# Patient Record
Sex: Female | Born: 1935 | Race: Black or African American | Hispanic: No | Smoking: Former smoker
Health system: Southern US, Community
[De-identification: ages and names within clinical notes are randomized; demographics above are authoritative.]

## PROBLEM LIST (undated history)

## (undated) DIAGNOSIS — E119 Type 2 diabetes mellitus without complications: Secondary | ICD-10-CM

## (undated) DIAGNOSIS — E785 Hyperlipidemia, unspecified: Secondary | ICD-10-CM

## (undated) DIAGNOSIS — C799 Secondary malignant neoplasm of unspecified site: Secondary | ICD-10-CM

## (undated) DIAGNOSIS — J45909 Unspecified asthma, uncomplicated: Secondary | ICD-10-CM

## (undated) DIAGNOSIS — H409 Unspecified glaucoma: Secondary | ICD-10-CM

## (undated) DIAGNOSIS — I10 Essential (primary) hypertension: Secondary | ICD-10-CM

## (undated) DIAGNOSIS — N189 Chronic kidney disease, unspecified: Secondary | ICD-10-CM

## (undated) DIAGNOSIS — G709 Myoneural disorder, unspecified: Secondary | ICD-10-CM

## (undated) HISTORY — DX: Essential (primary) hypertension: I10

## (undated) HISTORY — DX: Hyperlipidemia, unspecified: E78.5

## (undated) HISTORY — DX: Chronic kidney disease, unspecified: N18.9

## (undated) HISTORY — DX: Unspecified asthma, uncomplicated: J45.909

## (undated) HISTORY — DX: Unspecified glaucoma: H40.9

## (undated) HISTORY — DX: Type 2 diabetes mellitus without complications: E11.9

## (undated) HISTORY — DX: Myoneural disorder, unspecified: G70.9

---

## 1998-01-15 ENCOUNTER — Ambulatory Visit (HOSPITAL_COMMUNITY): Admission: RE | Admit: 1998-01-15 | Discharge: 1998-01-15 | Payer: Self-pay | Admitting: Internal Medicine

## 1998-10-20 ENCOUNTER — Other Ambulatory Visit: Admission: RE | Admit: 1998-10-20 | Discharge: 1998-10-20 | Payer: Self-pay | Admitting: Internal Medicine

## 1998-11-24 ENCOUNTER — Ambulatory Visit (HOSPITAL_COMMUNITY): Admission: RE | Admit: 1998-11-24 | Discharge: 1998-11-24 | Payer: Self-pay | Admitting: *Deleted

## 1999-05-31 ENCOUNTER — Encounter: Payer: Self-pay | Admitting: Internal Medicine

## 1999-05-31 ENCOUNTER — Encounter: Admission: RE | Admit: 1999-05-31 | Discharge: 1999-05-31 | Payer: Self-pay | Admitting: Internal Medicine

## 1999-06-30 ENCOUNTER — Ambulatory Visit (HOSPITAL_COMMUNITY): Admission: RE | Admit: 1999-06-30 | Discharge: 1999-06-30 | Payer: Self-pay | Admitting: Gastroenterology

## 2000-01-07 ENCOUNTER — Encounter: Admission: RE | Admit: 2000-01-07 | Discharge: 2000-01-07 | Payer: Self-pay | Admitting: Internal Medicine

## 2000-01-07 ENCOUNTER — Encounter: Payer: Self-pay | Admitting: Internal Medicine

## 2000-11-23 ENCOUNTER — Encounter: Payer: Self-pay | Admitting: Internal Medicine

## 2000-11-23 ENCOUNTER — Encounter: Admission: RE | Admit: 2000-11-23 | Discharge: 2000-11-23 | Payer: Self-pay | Admitting: Internal Medicine

## 2001-01-08 ENCOUNTER — Encounter: Admission: RE | Admit: 2001-01-08 | Discharge: 2001-01-08 | Payer: Self-pay | Admitting: Internal Medicine

## 2001-01-08 ENCOUNTER — Encounter: Payer: Self-pay | Admitting: Internal Medicine

## 2002-01-09 ENCOUNTER — Encounter: Admission: RE | Admit: 2002-01-09 | Discharge: 2002-01-09 | Payer: Self-pay | Admitting: Internal Medicine

## 2002-01-09 ENCOUNTER — Encounter: Payer: Self-pay | Admitting: Internal Medicine

## 2003-01-10 ENCOUNTER — Encounter: Payer: Self-pay | Admitting: Internal Medicine

## 2003-01-10 ENCOUNTER — Encounter: Admission: RE | Admit: 2003-01-10 | Discharge: 2003-01-10 | Payer: Self-pay | Admitting: Internal Medicine

## 2003-04-11 ENCOUNTER — Other Ambulatory Visit: Admission: RE | Admit: 2003-04-11 | Discharge: 2003-04-11 | Payer: Self-pay | Admitting: Internal Medicine

## 2003-05-07 ENCOUNTER — Ambulatory Visit (HOSPITAL_COMMUNITY): Admission: RE | Admit: 2003-05-07 | Discharge: 2003-05-07 | Payer: Self-pay | Admitting: Gastroenterology

## 2003-05-07 ENCOUNTER — Encounter (INDEPENDENT_AMBULATORY_CARE_PROVIDER_SITE_OTHER): Payer: Self-pay | Admitting: Specialist

## 2004-01-15 ENCOUNTER — Encounter: Admission: RE | Admit: 2004-01-15 | Discharge: 2004-01-15 | Payer: Self-pay | Admitting: Internal Medicine

## 2004-08-20 ENCOUNTER — Encounter: Admission: RE | Admit: 2004-08-20 | Discharge: 2004-08-20 | Payer: Self-pay | Admitting: Internal Medicine

## 2005-01-25 ENCOUNTER — Encounter: Admission: RE | Admit: 2005-01-25 | Discharge: 2005-01-25 | Payer: Self-pay | Admitting: Internal Medicine

## 2005-08-11 ENCOUNTER — Encounter: Admission: RE | Admit: 2005-08-11 | Discharge: 2005-08-11 | Payer: Self-pay | Admitting: Internal Medicine

## 2006-01-26 ENCOUNTER — Encounter: Admission: RE | Admit: 2006-01-26 | Discharge: 2006-01-26 | Payer: Self-pay | Admitting: Internal Medicine

## 2006-02-14 ENCOUNTER — Encounter: Admission: RE | Admit: 2006-02-14 | Discharge: 2006-02-14 | Payer: Self-pay | Admitting: Internal Medicine

## 2007-02-06 ENCOUNTER — Encounter: Admission: RE | Admit: 2007-02-06 | Discharge: 2007-02-06 | Payer: Self-pay | Admitting: Internal Medicine

## 2008-02-08 ENCOUNTER — Encounter: Admission: RE | Admit: 2008-02-08 | Discharge: 2008-02-08 | Payer: Self-pay | Admitting: Internal Medicine

## 2008-12-26 ENCOUNTER — Ambulatory Visit (HOSPITAL_BASED_OUTPATIENT_CLINIC_OR_DEPARTMENT_OTHER): Admission: RE | Admit: 2008-12-26 | Discharge: 2008-12-26 | Payer: Self-pay | Admitting: Orthopedic Surgery

## 2009-02-11 ENCOUNTER — Encounter: Admission: RE | Admit: 2009-02-11 | Discharge: 2009-02-11 | Payer: Self-pay | Admitting: Internal Medicine

## 2009-08-04 ENCOUNTER — Encounter: Admission: RE | Admit: 2009-08-04 | Discharge: 2009-08-04 | Payer: Self-pay | Admitting: Internal Medicine

## 2010-02-16 ENCOUNTER — Encounter: Admission: RE | Admit: 2010-02-16 | Discharge: 2010-02-16 | Payer: Self-pay | Admitting: Internal Medicine

## 2010-08-09 ENCOUNTER — Encounter: Payer: Self-pay | Admitting: Internal Medicine

## 2010-10-25 LAB — BASIC METABOLIC PANEL
Calcium: 8.8 mg/dL (ref 8.4–10.5)
GFR calc Af Amer: 42 mL/min — ABNORMAL LOW (ref >60)
GFR calc non Af Amer: 35 mL/min — ABNORMAL LOW (ref >60)
Glucose, Bld: 116 mg/dL — ABNORMAL HIGH (ref 70–99)
Sodium: 140 mEq/L (ref 135–145)

## 2010-11-30 NOTE — Op Note (Signed)
April Kennedy, April Kennedy              ACCOUNT NO.:  [id number]   MEDICAL RECORD NO.:  [id number]          PATIENT TYPE:  AMB   LOCATION:  DSC                          FACILITY:  [facility]   PHYSICIAN:  Katy Fitch. Sypher, M.D. DATE OF BIRTH:  April 29, 1934   DATE OF PROCEDURE:  12/26/2008  DATE OF DISCHARGE:                               OPERATIVE REPORT   PREOPERATIVE DIAGNOSIS:  Entrapment neuropathy, median nerve, right  carpal tunnel.   POSTOPERATIVE DIAGNOSIS:  Entrapment neuropathy, median nerve, right  carpal tunnel.   OPERATIONS:  Release of right transcarpal ligament.   OPERATIONS:  Katy Fitch. Sypher, MD   ASSISTANT:  Marveen Reeks Dasnoit, PA-C   ANESTHESIA:  General by LMA.   SUPERVISING ANESTHESIOLOGIST:  Quita Skye. Krista Blue, MD   INDICATIONS:  [name] is a 77 year old woman referred through the  courtesy of Dr. Willey Blade for evaluation and management of hand  numbness.  Clinical examination revealed signs of right carpal tunnel  syndrome.  Electrodiagnostic studies were completed and documented  median neuropathy.   Due to failed respond to nonoperative measures, she is brought to the  operating at this time for release of right transcarpal ligament.   PROCEDURE:  Keimya Briddell was brought to the operating room and placed  in the supine position upon the operating table.   Following the induction of general anesthesia by LMA technique, the  right arm was prepped with Betadine soap and solution, sterilely draped.  A pneumatic tourniquet was applied to the proximal right brachium.   On exsanguination of the right arm with Esmarch bandage, arterial  tourniquet was inflated to 250 mmHg.   The procedure commenced with a short incision in line of the ring finger  and the palm.  Subcutaneous tissues were carefully divided revealing  palmar fascia.  This was split longitudinally to reveal the common  sensory branch of the median nerve.  There was a variant of thenar  muscle  anatomy that extended towards the hypothenar eminence.  This was  carefully teased apart with a Penfield 4 elevator, looking for motor  branches.  None were identified.  With great care, the fascia at the  margin of the transverse carpal ligament distally was released and  ultimately we were able to identify the plane of the nerve and the  tendons in the ulnar bursa.   The ulnar aspect of the transverse carpal ligament was then released  subcutaneously into the distal forearm.  This widely opened the carpal  canal.  No mass or other predicaments were noted.   Bleeding points along the margin of the released ligament were  electrocauterized with bipolar current followed by repair of the skin  with intradermal 3-0 Prolene suture.   A compressive dressing was applied with a volar plaster splint  maintaining the wrist in 5 degrees of dorsiflexion.   There were no apparent complications.   For aftercare, Ms. Guerrieri was placed in a compressive dressing with a  volar plaster splint maintaining the wrist in 5 degrees of dorsiflexion.  She is provided prescription for Vicodin 5 mg 1 p.o. q.4-6 h.  p.r.n.  pain.  She is provided 20 tablets without refill.   I will see her back for followup in 1 week or sooner p.r.n. problems.      Katy Fitch Sypher, M.D.  Electronically Signed     RVS/MEDQ  D:  12/26/2008  T:  12/27/2008  Job:  [id number]   cc:   Minerva Areola L. August Saucer, M.D.

## 2010-12-03 ENCOUNTER — Ambulatory Visit
Admission: RE | Admit: 2010-12-03 | Discharge: 2010-12-03 | Disposition: A | Discharge status: Home / Self Care | Payer: Medicare Other | Source: Ambulatory Visit | Attending: Internal Medicine | Admitting: Internal Medicine

## 2010-12-03 ENCOUNTER — Other Ambulatory Visit: Payer: Self-pay | Admitting: Internal Medicine

## 2010-12-03 DIAGNOSIS — J40 Bronchitis, not specified as acute or chronic: Secondary | ICD-10-CM

## 2010-12-03 NOTE — Op Note (Signed)
NAME:  April Kennedy, April Kennedy                        ACCOUNT NO.:  [id number]   MEDICAL RECORD NO.:  [id number]                   PATIENT TYPE:  AMB   LOCATION:  ENDO                                 FACILITY:  [facility]   PHYSICIAN:  Anselmo Rod, M.D.               DATE OF BIRTH:  February 23, 1934   DATE OF PROCEDURE:  05/07/2003  DATE OF DISCHARGE:                                 OPERATIVE REPORT   PROCEDURE PERFORMED:  Colonoscopy with snare polypectomy times two and cold  biopsies times five.   ENDOSCOPIST:  Charna Elizabeth, M.D.   INSTRUMENT USED:  Olympus video colonoscope.   INDICATIONS FOR PROCEDURE:  The patient is a 77 year old African-American  female with a personal history of adenomatous undergoing repeat colonoscopy.  Rule out colonic polyps, masses, etc.   PREPROCEDURE PREPARATION:  Informed consent was procured from the patient.  The patient was fasted for eight hours prior to the procedure and prepped  with a bottle of magnesium citrate and a gallon of GoLYTELY the night prior  to the procedure.   PREPROCEDURE PHYSICAL:  The patient had stable vital signs.  Neck supple.  Chest clear to auscultation.  S1 and S2 regular.  Abdomen soft with normal  bowel sounds.   DESCRIPTION OF PROCEDURE:  The patient was placed in left lateral decubitus  position and sedated with 70 mg of Demerol and 7 mg of Versed intravenously.  Once the patient was adequately sedated and maintained on low flow oxygen  and continuous cardiac monitoring, the Olympus video colonoscope was  advanced from the rectum to the cecum with difficulty.  There was a  significant amount of residual stool in the right colon.  The appendicular  orifice and ileocecal valve were visualized and photographed. There was  evidence of pandiverticulosis with more prominent changes in the sigmoid  colon.  Two flat polyps were snared from the proximal and distal right colon  and placed in the same bottle.  There was an erythematous  fold seen in the  proximal right colon which was biopsied for pathology.  The nature of this  lesion is unclear.  Question ischemia.  As there was significant amount of  residual stool in the right colon, multiple washes had to be done.  Small  lesions could have been missed.  Retroflexion in the rectum revealed no  abnormalities.  No masses or polyps were seen in the left or transverse  colon; however, visualization was not adequate.   IMPRESSION:  1. Two colonic polyps removed (see description above) from proximal and     distal right colon respectively.  2. Scattered diverticulosis.  3. Large amount of residual stool in the right colon.  Small lesion could     have been missed.  4. Erythematous fold biopsied from proximal right colon.   RECOMMENDATIONS:  1. Await pathology results.  2. Avoid all nonsteroidals including aspirin for the next four  weeks.  3. Outpatient follow-up for the next two weeks for further recommendations.                                                   Anselmo Rod, M.D.    JNM/MEDQ  D:  05/07/2003  T:  05/07/2003  Job:  [id number]   cc:   Minerva Areola L. August Saucer, M.D.  [address] [address]  [place]  [place] [address]  Fax: [phone]

## 2010-12-03 NOTE — Procedures (Signed)
[facility]  Patient:    April Kennedy                      MRN: [id number] Proc. Date: 06/30/99 Adm. Date:  [id number] Attending:  Charna Elizabeth CC:         Lind Guest. August Saucer, M.D.                           Procedure Report  DATE OF BIRTH:  July 21, 1933  REFERRING PHYSICIAN:  Lind Guest. August Saucer, M.D.  PROCEDURE PERFORMED:  Flexible sigmoidoscopy.  ENDOSCOPIST:  Anselmo Rod, M.D.  INSTRUMENT USED:  Olympus video colonoscope.  INDICATIONS FOR PROCEDURE:  Followup on an abnormal polyp at the anal verge with an atypical lymphoid infiltrate.  PREPROCEDURE PREPARATION:  Informed consent was procured from the patient.  The  patient was fasted for eight hours prior to the procedure, and prepped with two  Fleets enemas the morning of the procedure.  PREPROCEDURE PHYSICAL:  VITAL SIGNS:  Stable.  NECK:  Supple.  CHEST:  Clear to auscultation, S1 and S2 regular.  No murmur, rub or gallops. o rales, rhonchi or wheezing.  ABDOMEN:  Soft with normal abdominal bowel sounds.  DESCRIPTION OF PROCEDURE:  The patient was placed in the left lateral decubitus  position.  No sedation was used.  Once the patient was adequately positioned, the Olympus video colonoscope was advanced from the rectum to 30 cm without difficulty. Solid stool was present at 30 cm.  The anal verge appeared healthy with no recurrent masses or polyps.  No other abnormalities were seen after 30 cm.  The  procedure was aborted at this time.  RECOMMENDATIONS:  Repeat colonoscopy recommended in the next three years unless the patient were to develop any problems during the interim. DD:  06/30/99 TD:  07/01/99 Job: [id number]

## 2011-01-24 ENCOUNTER — Other Ambulatory Visit: Payer: Self-pay | Admitting: Internal Medicine

## 2011-01-24 DIAGNOSIS — Z1231 Encounter for screening mammogram for malignant neoplasm of breast: Secondary | ICD-10-CM

## 2011-02-22 ENCOUNTER — Ambulatory Visit
Admission: RE | Admit: 2011-02-22 | Discharge: 2011-02-22 | Disposition: A | Discharge status: Home / Self Care | Payer: Medicare Other | Source: Ambulatory Visit | Attending: Internal Medicine | Admitting: Internal Medicine

## 2011-02-22 DIAGNOSIS — Z1231 Encounter for screening mammogram for malignant neoplasm of breast: Secondary | ICD-10-CM

## 2011-11-10 LAB — HEMOGLOBIN A1C: Hgb A1c MFr Bld: 5.9 % (ref 4.0–6.0)

## 2011-11-22 ENCOUNTER — Other Ambulatory Visit: Payer: Self-pay | Admitting: Internal Medicine

## 2011-11-22 ENCOUNTER — Ambulatory Visit
Admission: RE | Admit: 2011-11-22 | Discharge: 2011-11-22 | Disposition: A | Discharge status: Home / Self Care | Payer: Medicare Other | Source: Ambulatory Visit | Attending: Internal Medicine | Admitting: Internal Medicine

## 2011-11-22 DIAGNOSIS — M25559 Pain in unspecified hip: Secondary | ICD-10-CM

## 2012-01-27 ENCOUNTER — Other Ambulatory Visit: Payer: Self-pay | Admitting: Internal Medicine

## 2012-01-27 DIAGNOSIS — Z1231 Encounter for screening mammogram for malignant neoplasm of breast: Secondary | ICD-10-CM

## 2012-02-23 ENCOUNTER — Ambulatory Visit
Admission: RE | Admit: 2012-02-23 | Discharge: 2012-02-23 | Disposition: A | Discharge status: Home / Self Care | Payer: Medicare Other | Source: Ambulatory Visit | Attending: Internal Medicine | Admitting: Internal Medicine

## 2012-02-23 DIAGNOSIS — Z1231 Encounter for screening mammogram for malignant neoplasm of breast: Secondary | ICD-10-CM

## 2012-06-28 LAB — BASIC METABOLIC PANEL
BUN: 24 mg/dL — AB (ref 4–21)
Potassium: 4.5 mmol/L (ref 3.4–5.3)
Sodium: 141 mmol/L (ref 137–147)

## 2012-06-28 LAB — CBC AND DIFFERENTIAL: HCT: 44 % (ref 36–46)

## 2012-06-28 LAB — HEPATIC FUNCTION PANEL
Alkaline Phosphatase: 49 U/L (ref 25–125)
Bilirubin, Total: 0.6 mg/dL

## 2012-08-24 ENCOUNTER — Encounter (HOSPITAL_COMMUNITY): Payer: Self-pay | Admitting: General Practice

## 2012-08-24 DIAGNOSIS — H409 Unspecified glaucoma: Secondary | ICD-10-CM | POA: Insufficient documentation

## 2012-08-24 DIAGNOSIS — Z720 Tobacco use: Secondary | ICD-10-CM | POA: Insufficient documentation

## 2012-08-24 DIAGNOSIS — E785 Hyperlipidemia, unspecified: Secondary | ICD-10-CM

## 2013-01-30 ENCOUNTER — Other Ambulatory Visit: Payer: Self-pay

## 2013-01-30 DIAGNOSIS — Z1231 Encounter for screening mammogram for malignant neoplasm of breast: Secondary | ICD-10-CM

## 2013-02-27 ENCOUNTER — Ambulatory Visit
Admission: RE | Admit: 2013-02-27 | Discharge: 2013-02-27 | Disposition: A | Discharge status: Home / Self Care | Payer: Medicare Other | Source: Ambulatory Visit

## 2013-02-27 DIAGNOSIS — Z1231 Encounter for screening mammogram for malignant neoplasm of breast: Secondary | ICD-10-CM

## 2014-04-21 ENCOUNTER — Other Ambulatory Visit: Payer: Self-pay

## 2014-04-21 DIAGNOSIS — Z1231 Encounter for screening mammogram for malignant neoplasm of breast: Secondary | ICD-10-CM

## 2014-04-22 ENCOUNTER — Ambulatory Visit
Admission: RE | Admit: 2014-04-22 | Discharge: 2014-04-22 | Disposition: A | Discharge status: Home / Self Care | Payer: Medicare Other | Source: Ambulatory Visit

## 2014-04-22 DIAGNOSIS — Z1231 Encounter for screening mammogram for malignant neoplasm of breast: Secondary | ICD-10-CM

## 2015-01-16 ENCOUNTER — Ambulatory Visit
Admission: RE | Admit: 2015-01-16 | Discharge: 2015-01-16 | Disposition: A | Discharge status: Home / Self Care | Payer: Medicare Other | Source: Ambulatory Visit | Attending: Internal Medicine | Admitting: Internal Medicine

## 2015-01-16 ENCOUNTER — Encounter (HOSPITAL_COMMUNITY): Payer: Self-pay | Admitting: *Deleted

## 2015-01-16 ENCOUNTER — Emergency Department (HOSPITAL_COMMUNITY): Payer: Medicare Other

## 2015-01-16 ENCOUNTER — Other Ambulatory Visit: Payer: Self-pay | Admitting: Internal Medicine

## 2015-01-16 ENCOUNTER — Inpatient Hospital Stay (HOSPITAL_COMMUNITY)
Admission: EM | Admit: 2015-01-16 | Discharge: 2015-01-27 | DRG: 843 | Disposition: A | Discharge status: Home Health | Payer: Medicare Other | Attending: Family Medicine | Admitting: Family Medicine

## 2015-01-16 ENCOUNTER — Inpatient Hospital Stay (HOSPITAL_COMMUNITY): Payer: Medicare Other

## 2015-01-16 DIAGNOSIS — J90 Pleural effusion, not elsewhere classified: Secondary | ICD-10-CM | POA: Diagnosis not present

## 2015-01-16 DIAGNOSIS — E785 Hyperlipidemia, unspecified: Secondary | ICD-10-CM

## 2015-01-16 DIAGNOSIS — R0602 Shortness of breath: Secondary | ICD-10-CM | POA: Diagnosis present

## 2015-01-16 DIAGNOSIS — J45901 Unspecified asthma with (acute) exacerbation: Secondary | ICD-10-CM | POA: Diagnosis present

## 2015-01-16 DIAGNOSIS — R0600 Dyspnea, unspecified: Secondary | ICD-10-CM | POA: Diagnosis not present

## 2015-01-16 DIAGNOSIS — E1165 Type 2 diabetes mellitus with hyperglycemia: Secondary | ICD-10-CM | POA: Diagnosis present

## 2015-01-16 DIAGNOSIS — R599 Enlarged lymph nodes, unspecified: Secondary | ICD-10-CM | POA: Insufficient documentation

## 2015-01-16 DIAGNOSIS — Z79899 Other long term (current) drug therapy: Secondary | ICD-10-CM | POA: Diagnosis not present

## 2015-01-16 DIAGNOSIS — N179 Acute kidney failure, unspecified: Secondary | ICD-10-CM

## 2015-01-16 DIAGNOSIS — R748 Abnormal levels of other serum enzymes: Secondary | ICD-10-CM | POA: Diagnosis present

## 2015-01-16 DIAGNOSIS — C801 Malignant (primary) neoplasm, unspecified: Secondary | ICD-10-CM | POA: Diagnosis present

## 2015-01-16 DIAGNOSIS — Z9689 Presence of other specified functional implants: Secondary | ICD-10-CM

## 2015-01-16 DIAGNOSIS — N183 Chronic kidney disease, stage 3 unspecified: Secondary | ICD-10-CM

## 2015-01-16 DIAGNOSIS — J4541 Moderate persistent asthma with (acute) exacerbation: Secondary | ICD-10-CM | POA: Diagnosis not present

## 2015-01-16 DIAGNOSIS — J869 Pyothorax without fistula: Secondary | ICD-10-CM

## 2015-01-16 DIAGNOSIS — H409 Unspecified glaucoma: Secondary | ICD-10-CM | POA: Diagnosis present

## 2015-01-16 DIAGNOSIS — R7989 Other specified abnormal findings of blood chemistry: Secondary | ICD-10-CM

## 2015-01-16 DIAGNOSIS — J9601 Acute respiratory failure with hypoxia: Secondary | ICD-10-CM | POA: Diagnosis present

## 2015-01-16 DIAGNOSIS — L600 Ingrowing nail: Secondary | ICD-10-CM | POA: Diagnosis present

## 2015-01-16 DIAGNOSIS — R739 Hyperglycemia, unspecified: Secondary | ICD-10-CM | POA: Diagnosis present

## 2015-01-16 DIAGNOSIS — Z87891 Personal history of nicotine dependence: Secondary | ICD-10-CM

## 2015-01-16 DIAGNOSIS — Z881 Allergy status to other antibiotic agents status: Secondary | ICD-10-CM

## 2015-01-16 DIAGNOSIS — J189 Pneumonia, unspecified organism: Secondary | ICD-10-CM

## 2015-01-16 DIAGNOSIS — C799 Secondary malignant neoplasm of unspecified site: Secondary | ICD-10-CM

## 2015-01-16 DIAGNOSIS — R778 Other specified abnormalities of plasma proteins: Secondary | ICD-10-CM | POA: Diagnosis present

## 2015-01-16 DIAGNOSIS — Z720 Tobacco use: Secondary | ICD-10-CM

## 2015-01-16 DIAGNOSIS — I10 Essential (primary) hypertension: Secondary | ICD-10-CM

## 2015-01-16 DIAGNOSIS — I129 Hypertensive chronic kidney disease with stage 1 through stage 4 chronic kidney disease, or unspecified chronic kidney disease: Secondary | ICD-10-CM | POA: Diagnosis present

## 2015-01-16 DIAGNOSIS — J910 Malignant pleural effusion: Secondary | ICD-10-CM | POA: Diagnosis present

## 2015-01-16 DIAGNOSIS — J948 Other specified pleural conditions: Secondary | ICD-10-CM | POA: Diagnosis not present

## 2015-01-16 DIAGNOSIS — Z8249 Family history of ischemic heart disease and other diseases of the circulatory system: Secondary | ICD-10-CM | POA: Diagnosis not present

## 2015-01-16 DIAGNOSIS — N19 Unspecified kidney failure: Secondary | ICD-10-CM | POA: Diagnosis not present

## 2015-01-16 DIAGNOSIS — N189 Chronic kidney disease, unspecified: Secondary | ICD-10-CM | POA: Diagnosis present

## 2015-01-16 DIAGNOSIS — Z1231 Encounter for screening mammogram for malignant neoplasm of breast: Secondary | ICD-10-CM

## 2015-01-16 HISTORY — DX: Secondary malignant neoplasm of unspecified site: C79.9

## 2015-01-16 LAB — BASIC METABOLIC PANEL
ANION GAP: 11 (ref 5–15)
BUN: 30 mg/dL — ABNORMAL HIGH (ref 6–20)
CO2: 26 mmol/L (ref 22–32)
Calcium: 8.8 mg/dL — ABNORMAL LOW (ref 8.9–10.3)
Chloride: 101 mmol/L (ref 101–111)
Creatinine, Ser: 2.35 mg/dL — ABNORMAL HIGH (ref 0.44–1.00)
GFR calc non Af Amer: 19 mL/min — ABNORMAL LOW (ref >60)
GFR, EST AFRICAN AMERICAN: 22 mL/min — AB (ref >60)
Glucose, Bld: 189 mg/dL — ABNORMAL HIGH (ref 65–99)
POTASSIUM: 3.9 mmol/L (ref 3.5–5.1)
Sodium: 138 mmol/L (ref 135–145)

## 2015-01-16 LAB — CBC WITH DIFFERENTIAL/PLATELET
Basophils Absolute: 0.1 10*3/uL (ref 0.0–0.1)
Basophils Relative: 1 % (ref 0–1)
Eosinophils Absolute: 0.3 10*3/uL (ref 0.0–0.7)
Eosinophils Relative: 4 % (ref 0–5)
HEMATOCRIT: 37 % (ref 36.0–46.0)
HEMOGLOBIN: 12.5 g/dL (ref 12.0–15.0)
LYMPHS ABS: 1.4 10*3/uL (ref 0.7–4.0)
Lymphocytes Relative: 17 % (ref 12–46)
MCH: 26.5 pg (ref 26.0–34.0)
MCHC: 33.8 g/dL (ref 30.0–36.0)
MCV: 78.4 fL (ref 78.0–100.0)
Monocytes Absolute: 0.9 10*3/uL (ref 0.1–1.0)
Monocytes Relative: 12 % (ref 3–12)
NEUTROS ABS: 5.2 10*3/uL (ref 1.7–7.7)
NEUTROS PCT: 66 % (ref 43–77)
Platelets: 232 10*3/uL (ref 150–400)
RBC: 4.72 MIL/uL (ref 3.87–5.11)
RDW: 13.1 % (ref 11.5–15.5)
WBC: 7.9 10*3/uL (ref 4.0–10.5)

## 2015-01-16 LAB — TROPONIN I
Troponin I: 0.15 ng/mL — ABNORMAL HIGH (ref <0.031)
Troponin I: 0.18 ng/mL — ABNORMAL HIGH (ref <0.031)

## 2015-01-16 LAB — BRAIN NATRIURETIC PEPTIDE: B Natriuretic Peptide: 32.3 pg/mL (ref 0.0–100.0)

## 2015-01-16 MED ORDER — SODIUM CHLORIDE 0.9 % IV SOLN: 250.0000 mL | INTRAVENOUS | Status: DC | PRN

## 2015-01-16 MED ORDER — ACETAMINOPHEN 325 MG PO TABS
650.0000 mg | ORAL_TABLET | Freq: Four times a day (QID) | ORAL | Status: DC | PRN
  Administered 2015-01-20: 650 mg via ORAL
  Filled 2015-01-16: qty 2

## 2015-01-16 MED ORDER — SODIUM CHLORIDE 0.9 % IJ SOLN
3.0000 mL | Freq: Two times a day (BID) | INTRAMUSCULAR | Status: DC
  Administered 2015-01-16 – 2015-01-18 (×4): 3 mL via INTRAVENOUS

## 2015-01-16 MED ORDER — LATANOPROST 0.005 % OP SOLN
1.0000 [drp] | Freq: Every day | OPHTHALMIC | Status: DC
  Administered 2015-01-16 – 2015-01-26 (×11): 1 [drp] via OPHTHALMIC
  Filled 2015-01-16 (×3): qty 2.5

## 2015-01-16 MED ORDER — ACETAMINOPHEN 650 MG RE SUPP: 650.0000 mg | Freq: Four times a day (QID) | RECTAL | Status: DC | PRN

## 2015-01-16 MED ORDER — LORAZEPAM 0.5 MG PO TABS
0.5000 mg | ORAL_TABLET | Freq: Once | ORAL | Status: AC
  Administered 2015-01-17: 0.5 mg via ORAL
  Filled 2015-01-16: qty 1

## 2015-01-16 MED ORDER — AMLODIPINE BESYLATE 10 MG PO TABS
10.0000 mg | ORAL_TABLET | Freq: Every day | ORAL | Status: DC
  Administered 2015-01-16 – 2015-01-27 (×12): 10 mg via ORAL
  Filled 2015-01-16 (×12): qty 1

## 2015-01-16 MED ORDER — SODIUM CHLORIDE 0.9 % IJ SOLN: 3.0000 mL | INTRAMUSCULAR | Status: DC | PRN

## 2015-01-16 MED ORDER — BRIMONIDINE TARTRATE 0.2 % OP SOLN
1.0000 [drp] | Freq: Three times a day (TID) | OPHTHALMIC | Status: DC
  Administered 2015-01-16 – 2015-01-27 (×31): 1 [drp] via OPHTHALMIC
  Filled 2015-01-16 (×3): qty 5

## 2015-01-16 MED ORDER — HEPARIN SODIUM (PORCINE) 5000 UNIT/ML IJ SOLN
5000.0000 [IU] | Freq: Three times a day (TID) | INTRAMUSCULAR | Status: DC
  Administered 2015-01-16 – 2015-01-27 (×29): 5000 [IU] via SUBCUTANEOUS
  Filled 2015-01-16 (×32): qty 1

## 2015-01-16 MED ORDER — ADULT MULTIVITAMIN W/MINERALS CH
1.0000 | ORAL_TABLET | Freq: Every day | ORAL | Status: DC
  Administered 2015-01-16 – 2015-01-27 (×12): 1 via ORAL
  Filled 2015-01-16 (×12): qty 1

## 2015-01-16 MED ORDER — HYDRALAZINE HCL 20 MG/ML IJ SOLN: 10.0000 mg | Freq: Four times a day (QID) | INTRAMUSCULAR | Status: DC | PRN

## 2015-01-16 NOTE — ED Notes (Signed)
She  Saw her doctor Monday and again today for the same complaints

## 2015-01-16 NOTE — H&P (Signed)
[facility] History and Physical  RIYA HUXFORD ZOX:[id number] DOB: 07/01/1934 DOA: 01/16/2015  Referring physician: er PCP: Marlou Sa ERIC, MD   Chief Complaint: SOB  HPI: April Kennedy is a 81 y.o. female  Who has PMHX of HTN and HLD.  She was told to come to the ER when she had a chest x ray ordered by her PCP that showed pleural effusion.  She for 1 week has had SOB with activity.  No LE edema, no fever.  She does endorse weight loss that she contributes to a decreased appetite.  SHe has night sweat and chills.  She was recently started on an abx (not sure of name and has taken 3/7 doses).  She also states that 1 month ago she has a terrible cold and a cough.  In the ER, she had another x ray that confirmed right sided pleural effusion.  Her Cr was also found to be elevated at 2.35.     Review of Systems:  All systems reviewed, negative unless stated above    Past Medical History  Diagnosis Date  . Hypertension   . Glaucoma   . Chronic kidney disease   . Neuromuscular disorder   . Diabetes mellitus without complication   . Asthma   . Hyperlipidemia    History reviewed. No pertinent past surgical history. Social History:  reports that she has quit smoking. She does not have any smokeless tobacco history on file. She reports that she does not drink alcohol. Her drug history is not on file.  Allergies  Allergen Reactions  . Sulfa Antibiotics     Hives/ itching      Family Hx HTN  Prior to Admission medications   Medication Sig Start Date End Date Taking? Authorizing Provider  amLODipine-benazepril (LOTREL) 10-20 MG per capsule Take 1 capsule by mouth daily.   Yes Historical Provider, MD  bimatoprost (LUMIGAN) 0.01 % SOLN Place 1 drop into both eyes at bedtime.    Yes Historical Provider, MD  brimonidine (ALPHAGAN P) 0.1 % SOLN Place 1 drop into both eyes 3 (three) times daily.    Yes Historical Provider, MD  Multiple Vitamin (MULTIVITAMIN) tablet Take 1 tablet  by mouth daily.   Yes Historical Provider, MD   Physical Exam: Filed Vitals:   01/16/15 1522 01/16/15 1600  BP: 127/60 106/91  Pulse: 108 105  Temp: 98.5 F (36.9 C)   Resp: 20 19  Height: '5\' 4"'$  (1.626 m)   Weight: 83.575 kg (184 lb 4 oz)   SpO2: 95% 94%    Wt Readings from Last 3 Encounters:  01/16/15 83.575 kg (184 lb 4 oz)    General:  Appears calm and comfortable Eyes:  normal lids, irises & conjunctiva ENT: grossly normal hearing, lips & tongue Neck: no LAD, masses or thyromegaly Cardiovascular: RRR, no m/r/g. No LE edema. Respiratory: right side with no air movement. normal respiratory effort. No wheezing Abdomen: soft, ntnd Skin: no rash or induration seen on limited exam Musculoskeletal: grossly normal tone BUE/BLE Psychiatric: grossly normal mood and affect, speech fluent and appropriate Neurologic: grossly non-focal.          Labs on Admission:  Basic Metabolic Panel:  Recent Labs Lab 01/16/15 1532  NA 138  K 3.9  CL 101  CO2 26  GLUCOSE 189*  BUN 30*  CREATININE 2.35*  CALCIUM 8.8*   Liver Function Tests: No results for input(s): AST, ALT, ALKPHOS, BILITOT, PROT, ALBUMIN in the last 168 hours. No results  for input(s): LIPASE, AMYLASE in the last 168 hours. No results for input(s): AMMONIA in the last 168 hours. CBC:  Recent Labs Lab 01/16/15 1532  WBC 7.9  NEUTROABS 5.2  HGB 12.5  HCT 37.0  MCV 78.4  PLT 232   Cardiac Enzymes:  Recent Labs Lab 01/16/15 1532  TROPONINI 0.15*    BNP (last 3 results)  Recent Labs  01/16/15 1532  BNP 32.3    ProBNP (last 3 results) No results for input(s): PROBNP in the last 8760 hours.  CBG: No results for input(s): GLUCAP in the last 168 hours.  Radiological Exams on Admission: Dg Chest 2 View  01/16/2015   CLINICAL DATA:  Fluid on the RIGHT lung. Short of breath. Cough for 1 week.  EXAM: CHEST  2 VIEW  COMPARISON:  01/16/2015 chest radiograph at 1154 hours.  FINDINGS: There is a moderate  RIGHT pleural effusion. Collapse/consolidation at the RIGHT lung base involving the RIGHT middle and RIGHT lower lobe. LEFT lung appears within normal limits aside from basilar atelectasis. Cardiopericardial silhouette partially obscured. Aortic arch atherosclerosis.  There is fullness of the RIGHT hilum, which may represent relaxation atelectasis and retraction of the lung however a RIGHT hilar mass cannot be excluded.  IMPRESSION: Moderate unilateral RIGHT pleural effusion with collapse/consolidation of the RIGHT middle and RIGHT lower lobes. RIGHT hilar fullness. The differential considerations are primarily pneumonia with parapneumonic effusion or malignant effusion. Consider thoracentesis with cytology contrast-enhanced chest CT may be useful for further evaluation.   Electronically Signed   By: Dereck Ligas M.D.   On: 01/16/2015 15:57   Dg Chest 2 View  01/16/2015   CLINICAL DATA:  Shortness of breath with exertion, chest tightness and fatigue for 10 days; cold symptoms 4 weeks ago without full recovery; history of asthma and diabetes, discontinued smoking 2 years ago.  EXAM: CHEST  2 VIEW  COMPARISON:  PA and lateral chest of Dec 03, 2010  FINDINGS: There is volume loss on the right occupying approximately 1-1/3 to 1/2 of the lung volume. There is no mediastinal shift. The right upper lobe is clear as is the left lung. The heart and pulmonary vascularity are normal. The bony thorax is unremarkable.  IMPRESSION: Findings compatible with large right pleural effusion. Underlying parenchymal consolidation is suspected as well. Further evaluation with ultrasound would be useful to judge if patient is a candidate for thoracentesis. Chest CT scanning will likely be needed to evaluate the right pleural space more completely.   Electronically Signed   By: David  Martinique M.D.   On: 01/16/2015 11:50    EKG: Independently reviewed. Sinus tachy  Assessment/Plan Active Problems:   Glaucoma   Tobacco abuse    Hyperlipidemia   Pleural effusion, right   Hyperglycemia   Elevated troponin  SOB with exertion due to pleural effusion  Pleural effusion- will do lateral decub to be sure not loculated, also placed order for thoracentesis- unable to do CT with contrast due to elevated renal function - fluid studies ordered for when thoracentesis is done  AKI on CKD?- baseline 3 years ago was 1.9, now 2.3  Elevated troponin- cycle CE, echo, tele  HLD- statin  HTN- PRN IV, hold ACE, resume norvasc  Tobacco abuse former  Glaucoma- eye drops  Hyperglycemia- no h/o DM, on no meds, monitor and start SSI if > 140   Code Status: full DVT Prophylaxis: Family Communication: patient and daughter at bedside Disposition Plan:   Time spent: 75 min  Jakirah Zaun  [facility] Pager [phone]

## 2015-01-16 NOTE — ED Provider Notes (Signed)
CSN: [id number]     Arrival date & time 01/16/15  1509 History   First MD Initiated Contact with Patient 01/16/15 1606     Chief Complaint  Patient presents with  . Shortness of Breath     (Consider location/radiation/quality/duration/timing/severity/associated sxs/prior Treatment) Patient is a 81 y.o. female presenting with shortness of breath.  Shortness of Breath Severity:  Moderate Onset quality:  Gradual Duration:  1 week Timing:  Constant Progression:  Worsening Chronicity:  New Context: activity and URI (1 month ago with cough)   Relieved by:  Rest and sitting up Worsened by:  Exertion (lying flat.) Associated symptoms: cough (productive of occasional clear sputum)   Associated symptoms: no chest pain, no fever and no vomiting     Past Medical History  Diagnosis Date  . Hypertension   . Glaucoma   . Chronic kidney disease   . Neuromuscular disorder   . Diabetes mellitus without complication   . Asthma   . Hyperlipidemia    History reviewed. No pertinent past surgical history. No family history on file. History  Substance Use Topics  . Smoking status: Former Research scientist (life sciences)  . Smokeless tobacco: Not on file  . Alcohol Use: No   OB History    No data available     Review of Systems  Constitutional: Negative for fever.  Respiratory: Positive for cough (productive of occasional clear sputum) and shortness of breath.   Cardiovascular: Negative for chest pain.  Gastrointestinal: Negative for vomiting.  All other systems reviewed and are negative.     Allergies  Sulfa antibiotics  Home Medications   Prior to Admission medications   Medication Sig Start Date End Date Taking? Authorizing Provider  amLODipine-benazepril (LOTREL) 10-20 MG per capsule Take 1 capsule by mouth daily.    Historical Provider, MD  bimatoprost (LUMIGAN) 0.01 % SOLN 1 drop at bedtime.    Historical Provider, MD  brimonidine (ALPHAGAN P) 0.1 % SOLN Place 1 drop into both eyes.    Historical  Provider, MD  Multiple Vitamin (MULTIVITAMIN) tablet Take 1 tablet by mouth daily.    Historical Provider, MD   BP 106/91 mmHg  Pulse 105  Temp(Src) 98.5 F (36.9 C)  Resp 19  Ht '5\' 4"'$  (1.626 m)  Wt 184 lb 4 oz (83.575 kg)  BMI 31.61 kg/m2  SpO2 94% Physical Exam  Constitutional: She is oriented to person, place, and time. She appears well-developed and well-nourished. No distress.  HENT:  Head: Normocephalic and atraumatic.  Mouth/Throat: Oropharynx is clear and moist.  Eyes: Conjunctivae are normal. Pupils are equal, round, and reactive to light. No scleral icterus.  Neck: Neck supple.  Cardiovascular: Normal rate, regular rhythm, normal heart sounds and intact distal pulses.   No murmur heard. Pulmonary/Chest: Effort normal. No stridor. No respiratory distress. She has decreased breath sounds in the right middle field and the right lower field. She has no rales.  Abdominal: Soft. Bowel sounds are normal. She exhibits no distension. There is no tenderness.  Musculoskeletal: Normal range of motion. She exhibits no edema.  Neurological: She is alert and oriented to person, place, and time.  Skin: Skin is warm and dry. No rash noted.  Psychiatric: She has a normal mood and affect. Her behavior is normal.  Nursing note and vitals reviewed.   ED Course  Procedures (including critical care time) Labs Review Labs Reviewed  BASIC METABOLIC PANEL - Abnormal; Notable for the following:    Glucose, Bld 189 (*)    BUN  30 (*)    Creatinine, Ser 2.35 (*)    Calcium 8.8 (*)    GFR calc non Af Amer 19 (*)    GFR calc Af Amer 22 (*)    All other components within normal limits  TROPONIN I - Abnormal; Notable for the following:    Troponin I 0.15 (*)    All other components within normal limits  BRAIN NATRIURETIC PEPTIDE  CBC WITH DIFFERENTIAL/PLATELET    Imaging Review Dg Chest 2 View  01/16/2015   CLINICAL DATA:  Fluid on the RIGHT lung. Short of breath. Cough for 1 week.  EXAM:  CHEST  2 VIEW  COMPARISON:  01/16/2015 chest radiograph at 1154 hours.  FINDINGS: There is a moderate RIGHT pleural effusion. Collapse/consolidation at the RIGHT lung base involving the RIGHT middle and RIGHT lower lobe. LEFT lung appears within normal limits aside from basilar atelectasis. Cardiopericardial silhouette partially obscured. Aortic arch atherosclerosis.  There is fullness of the RIGHT hilum, which may represent relaxation atelectasis and retraction of the lung however a RIGHT hilar mass cannot be excluded.  IMPRESSION: Moderate unilateral RIGHT pleural effusion with collapse/consolidation of the RIGHT middle and RIGHT lower lobes. RIGHT hilar fullness. The differential considerations are primarily pneumonia with parapneumonic effusion or malignant effusion. Consider thoracentesis with cytology contrast-enhanced chest CT may be useful for further evaluation.   Electronically Signed   By: Dereck Ligas M.D.   On: 01/16/2015 15:57   Dg Chest 2 View  01/16/2015   CLINICAL DATA:  Shortness of breath with exertion, chest tightness and fatigue for 10 days; cold symptoms 4 weeks ago without full recovery; history of asthma and diabetes, discontinued smoking 2 years ago.  EXAM: CHEST  2 VIEW  COMPARISON:  PA and lateral chest of Dec 03, 2010  FINDINGS: There is volume loss on the right occupying approximately 1-1/3 to 1/2 of the lung volume. There is no mediastinal shift. The right upper lobe is clear as is the left lung. The heart and pulmonary vascularity are normal. The bony thorax is unremarkable.  IMPRESSION: Findings compatible with large right pleural effusion. Underlying parenchymal consolidation is suspected as well. Further evaluation with ultrasound would be useful to judge if patient is a candidate for thoracentesis. Chest CT scanning will likely be needed to evaluate the right pleural space more completely.   Electronically Signed   By: David  Martinique M.D.   On: 01/16/2015 11:50  All radiology  studies independently viewed by me.      EKG Interpretation   Date/Time:  Friday January 16 2015 15:18:26 EDT Ventricular Rate:  114 PR Interval:  134 QRS Duration: 72 QT Interval:  336 QTC Calculation: 463 R Axis:   61 Text Interpretation:  Sinus tachycardia Otherwise normal ECG rate  increased compared to prior Confirmed by [facility]  MD, TREY ([id number]) on  01/16/2015 4:34:51 PM      MDM   Final diagnoses:  Pleural effusion, right  Shortness of breath    81 yo female with hx of HTN, DM presenting with SOB increasing for past week.  She had outpatient CXR which showed large right pleural effusion.  CXR and labs ordered per ED protocol also reveal slightly elevated troponin (no chest pain and story does not sound consistent with ACS).    Spoke with Dr. Eliseo Squires ([place]) who will admit.    Serita Grit, MD 01/16/15 [phone]

## 2015-01-16 NOTE — ED Notes (Signed)
The pt just left dr e deans office c/o fluid on her lungs .  No chest pain unless  She coughs and she has a cold also

## 2015-01-17 ENCOUNTER — Inpatient Hospital Stay (HOSPITAL_COMMUNITY): Payer: Medicare Other

## 2015-01-17 DIAGNOSIS — N183 Chronic kidney disease, stage 3 (moderate): Secondary | ICD-10-CM

## 2015-01-17 DIAGNOSIS — R0600 Dyspnea, unspecified: Secondary | ICD-10-CM

## 2015-01-17 DIAGNOSIS — J90 Pleural effusion, not elsewhere classified: Secondary | ICD-10-CM

## 2015-01-17 DIAGNOSIS — J948 Other specified pleural conditions: Secondary | ICD-10-CM

## 2015-01-17 LAB — BODY FLUID CELL COUNT WITH DIFFERENTIAL
LYMPHS FL: 34 %
Monocyte-Macrophage-Serous Fluid: 5 % — ABNORMAL LOW (ref 50–90)
Neutrophil Count, Fluid: 61 % — ABNORMAL HIGH (ref 0–25)
WBC FLUID: 1666 uL — AB (ref 0–1000)

## 2015-01-17 LAB — BASIC METABOLIC PANEL
Anion gap: 9 (ref 5–15)
BUN: 26 mg/dL — ABNORMAL HIGH (ref 6–20)
CALCIUM: 8.8 mg/dL — AB (ref 8.9–10.3)
CO2: 28 mmol/L (ref 22–32)
CREATININE: 2.08 mg/dL — AB (ref 0.44–1.00)
Chloride: 100 mmol/L — ABNORMAL LOW (ref 101–111)
GFR calc Af Amer: 25 mL/min — ABNORMAL LOW (ref >60)
GFR, EST NON AFRICAN AMERICAN: 22 mL/min — AB (ref >60)
Glucose, Bld: 153 mg/dL — ABNORMAL HIGH (ref 65–99)
Potassium: 3.5 mmol/L (ref 3.5–5.1)
Sodium: 137 mmol/L (ref 135–145)

## 2015-01-17 LAB — LACTATE DEHYDROGENASE, PLEURAL OR PERITONEAL FLUID: LD, Fluid: 1546 U/L — ABNORMAL HIGH (ref 3–23)

## 2015-01-17 LAB — GLUCOSE, SEROUS FLUID: Glucose, Fluid: 112 mg/dL

## 2015-01-17 LAB — PROTEIN, TOTAL: TOTAL PROTEIN: 7.3 g/dL (ref 6.5–8.1)

## 2015-01-17 LAB — CBC
HCT: 39.5 % (ref 36.0–46.0)
HEMOGLOBIN: 13.3 g/dL (ref 12.0–15.0)
MCH: 26.8 pg (ref 26.0–34.0)
MCHC: 33.7 g/dL (ref 30.0–36.0)
MCV: 79.6 fL (ref 78.0–100.0)
Platelets: 239 10*3/uL (ref 150–400)
RBC: 4.96 MIL/uL (ref 3.87–5.11)
RDW: 13.2 % (ref 11.5–15.5)
WBC: 9.6 10*3/uL (ref 4.0–10.5)

## 2015-01-17 LAB — PROTEIN, BODY FLUID: TOTAL PROTEIN, FLUID: 5 g/dL

## 2015-01-17 LAB — TROPONIN I
TROPONIN I: 0.15 ng/mL — AB (ref <0.031)
TROPONIN I: 0.16 ng/mL — AB (ref <0.031)

## 2015-01-17 MED ORDER — LIDOCAINE HCL (PF) 1 % IJ SOLN
INTRAMUSCULAR | Status: AC
  Filled 2015-01-17: qty 10

## 2015-01-17 NOTE — Progress Notes (Signed)
  Echocardiogram 2D Echocardiogram has been performed.  April Kennedy 01/17/2015, 2:04 PM

## 2015-01-17 NOTE — Procedures (Signed)
[place] R thoracentesis No complication No blood loss. See complete dictation in [facility].

## 2015-01-17 NOTE — Progress Notes (Signed)
April Kennedy TMH:[id number] DOB: [birth year] DOA: 01/16/2015 PCP: Kevan Ny, MD  Brief narrative:  81 y/o ? independent at home, Admitted from PCP office as  OP 2 vw CXR suggestive of Pleural effusion.  PAteitn was noted to have been sick ~ 1 wk prior to admission with cough/cold, and started having a cough/cold ~ may when her granddaughter graduated apparently was Rx with Abx 3/7  Past medical history-As per Problem list Chart reviewed as below- reviewed  Consultants:   Pulmonary  Procedures:  [place] thoracocentesis  Antibiotics:  None yet   Subjective   Well Feels marginally better No f chills rigor cp  SOB still + Daughter chimes in [place] is main issue   Objective    Interim History:   Telemetry: PVC's   Objective: Filed Vitals:   01/16/15 1745 01/16/15 1828 01/16/15 2222 01/17/15 0513  BP: 145/60 147/56 155/87 143/60  Pulse: 101 108 110 104  Temp:  98.3 F (36.8 C) 97.8 F (36.6 C) 97.8 F (36.6 C)  TempSrc:  Oral Oral Oral  Resp: '26 20 20 18  '$ Height:  '5\' 4"'$  (1.626 m)    Weight:  82.555 kg (182 lb)    SpO2: 90% 95% 96% 100%    Intake/Output Summary (Last 24 hours) at 01/17/15 1012 Last data filed at 01/17/15 0954  Gross per 24 hour  Intake    240 ml  Output    200 ml  Net     40 ml    Exam:  General: eomi, ncat, no pallor ict Cardiovascular: s1 s2 no m/r Respiratory: clear but ? fremitus/resonance to R post ling fields Abdomen: soft nt nd no rebound nor gaurd Skin no le edema Neuro intact  Data Reviewed: Basic Metabolic Panel:  Recent Labs Lab 01/16/15 1532 01/17/15 0620  NA 138 137  K 3.9 3.5  CL 101 100*  CO2 26 28  GLUCOSE 189* 153*  BUN 30* 26*  CREATININE 2.35* 2.08*  CALCIUM 8.8* 8.8*   Liver Function Tests: No results for input(s): AST, ALT, ALKPHOS, BILITOT, PROT, ALBUMIN in the last 168 hours. No results for input(s): LIPASE, AMYLASE in the last 168 hours. No results for input(s): AMMONIA in the last 168  hours. CBC:  Recent Labs Lab 01/16/15 1532 01/17/15 0620  WBC 7.9 9.6  NEUTROABS 5.2  --   HGB 12.5 13.3  HCT 37.0 39.5  MCV 78.4 79.6  PLT 232 239   Cardiac Enzymes:  Recent Labs Lab 01/16/15 1532 01/16/15 2001 01/17/15 0022 01/17/15 0620  TROPONINI 0.15* 0.18* 0.16* 0.15*   BNP: Invalid input(s): POCBNP CBG: No results for input(s): GLUCAP in the last 168 hours.  No results found for this or any previous visit (from the past 240 hour(s)).   Studies:              All Imaging reviewed and is as per above notation   Scheduled Meds: . amLODipine  10 mg Oral Daily  . brimonidine  1 drop Both Eyes TID  . heparin  5,000 Units Subcutaneous 3 times per day  . latanoprost  1 drop Both Eyes QHS  . multivitamin with minerals  1 tablet Oral Daily  . sodium chloride  3 mL Intravenous Q12H   Continuous Infusions:    Assessment/Plan:  Principal Problem:   Pleural effusion, right-? Exudate vs transudate-obtaining usual labs with thora i.e Glucose , ldh, protein, cell count etc etc--Hold off on TB testing for right now.  DDX given smoker =  Malignancy-I have NOT discussed this possibility with patient/family just yet.  Desat screen to be donw CXR post procedure r/o PTXH and rpt CXR am to denote rapidity of re-collection ordered Appreciate pulm input    Glaucoma continue Alphagan +LAtanaprost drops   Tobacco abuse co-cessation counseling may be needed   Hyperlipidemia Continue statin as OP   Hyperglycemia-monitor on am labs   Elevated troponin-no CP-likely demand ischemia vs spurious cause in setting CKD-no further work-up CP-likely demand ischemia vs spurious 2/2 to CKDge   Acute kidney injury superimposed upon CKD (chronic kidney disease)Estimated Creatinine Clearance: 23.2 mL/min (by C-G formula based on Cr of 2.08).  -hold nephrotoxinx and monitor    Code Status: full Family Communication:  Discussed with daughter bedside Disposition Plan:  inpatient  >35  Verneita Griffes, MD  [facility] Pager [phone] 01/17/2015, 10:12 AM    LOS: 1 day

## 2015-01-17 NOTE — Consult Note (Signed)
PULMONARY / CRITICAL CARE MEDICINE   Name: April Kennedy MRN: [id number] DOB: [birth year]    ADMISSION DATE:  01/16/2015 CONSULTATION DATE:  01/17/15  REFERRING MD :  [facility]   CHIEF COMPLAINT:  dyspnea  INITIAL PRESENTATION:  80 yobm quit smoking 2014 with no apparent sequelae/ restrictions then "really bad cold" late May 2016 never really got over it then much worse cough/sob x 1 week pta with mostly white mucus production but unable to lie flat due to sob so admitted 7/1 with new R effusion noted and pccm service asked to eval am 7/2      SIGNIFICANT EVENTS R  thoracentesis 7/2 >   Exudative  With very high LDH  HISTORY OF PRESENT ILLNESS:    Baseline she is not limted by breathing and has no tendency to bronchitis or exac of wheezing and no pulmonary meds and really not feeling good x sev month with new cough outlined above  No obvious day to day or daytime variabilty or assoc chronic cough or cp or chest tightness, subjective wheeze overt sinus or hb symptoms. No unusual exp hx or h/o childhood pna/ asthma or knowledge of premature birth.  Sleeping ok without nocturnal  or early am exacerbation  of respiratory  c/o's or need for noct saba. Also denies any obvious fluctuation of symptoms with weather or environmental changes or other aggravating or alleviating factors except as outlined above   Current Medications, Allergies, Complete Past Medical History, Past Surgical History, Family History, and Social History were reviewed in [employer] record.  ROS  The following are not active complaints unless bolded sore throat, dysphagia, dental problems, itching, sneezing,  nasal congestion or excess/ purulent secretions, ear ache,   fever, chills, sweats, unintended wt loss, pleuritic or exertional cp, hemoptysis,  orthopnea pnd or leg swelling, presyncope, palpitations, abdominal pain, anorexia, nausea, vomiting, diarrhea  or change in bowel or urinary habits, change  in stools or urine, dysuria,hematuria,  rash, arthralgias, visual complaints, headache, numbness weakness or ataxia or problems with walking or coordination,  change in mood/affect or memory.       PAST MEDICAL HISTORY :   has a past medical history of Hypertension; Glaucoma; Chronic kidney disease; Neuromuscular disorder; Diabetes mellitus without complication; Asthma; and Hyperlipidemia.  has no past surgical history on file. Prior to Admission medications   Medication Sig Start Date End Date Taking? Authorizing Provider  amLODipine-benazepril (LOTREL) 10-20 MG per capsule Take 1 capsule by mouth daily.   Yes Historical Provider, MD  bimatoprost (LUMIGAN) 0.01 % SOLN Place 1 drop into both eyes at bedtime.    Yes Historical Provider, MD  brimonidine (ALPHAGAN P) 0.1 % SOLN Place 1 drop into both eyes 3 (three) times daily.    Yes Historical Provider, MD  Multiple Vitamin (MULTIVITAMIN) tablet Take 1 tablet by mouth daily.   Yes Historical Provider, MD   Allergies  Allergen Reactions  . Sulfa Antibiotics     Hives/ itching     FAMILY HISTORY:  has no family status information on file.  SOCIAL HISTORY:  reports that she has quit smoking. She does not have any smokeless tobacco history on file. She reports that she does not drink alcohol.    SUBJECTIVE:  Comfortable sitting up in bed on 02 2,5lpm 02   VITAL SIGNS: Temp:  [97.8 F (36.6 C)-98.3 F (36.8 C)] 97.8 F (36.6 C) (07/02 1152) Pulse Rate:  [93-110] 99 (07/02 1337) Resp:  [18-26] 18 (07/02 1152)  BP: (105-155)/(50-91) 152/68 mmHg (07/02 1337) SpO2:  [90 %-100 %] 98 % (07/02 1047) Weight:  [182 lb (82.555 kg)] 182 lb (82.555 kg) (07/01 1828) HEMODYNAMICS:   VENTILATOR SETTINGS:   INTAKE / OUTPUT:  Intake/Output Summary (Last 24 hours) at 01/17/15 1548 Last data filed at 01/17/15 0954  Gross per 24 hour  Intake    240 ml  Output    200 ml  Net     40 ml    PHYSICAL EXAMINATION: General:  Minimally  uncomfortable appearing elderly bf nad  Pt alert, approp    No jvd Oropharynx clear Neck supple Lungs with a decreased bs dullness at R base  RRR no s3 or or sign murmur Abd obese with excursion >> Extr wam with no edema or clubbing noted Neuro  No motor deficits   LABS:  CBC  Recent Labs Lab 01/16/15 1532 01/17/15 0620  WBC 7.9 9.6  HGB 12.5 13.3  HCT 37.0 39.5  PLT 232 239   Coag's No results for input(s): APTT, INR in the last 168 hours. BMET  Recent Labs Lab 01/16/15 1532 01/17/15 0620  NA 138 137  K 3.9 3.5  CL 101 100*  CO2 26 28  BUN 30* 26*  CREATININE 2.35* 2.08*  GLUCOSE 189* 153*   Electrolytes  Recent Labs Lab 01/16/15 1532 01/17/15 0620  CALCIUM 8.8* 8.8*   Sepsis Markers No results for input(s): LATICACIDVEN, PROCALCITON, O2SATVEN in the last 168 hours. ABG No results for input(s): PHART, PCO2ART, PO2ART in the last 168 hours. Liver Enzymes No results for input(s): AST, ALT, ALKPHOS, BILITOT, ALBUMIN in the last 168 hours. Cardiac Enzymes  Recent Labs Lab 01/16/15 2001 01/17/15 0022 01/17/15 0620  TROPONINI 0.18* 0.16* 0.15*   Glucose No results for input(s): GLUCAP in the last 168 hours.  Imaging Dg Chest 2 View  01/16/2015   CLINICAL DATA:  Fluid on the RIGHT lung. Short of breath. Cough for 1 week.  EXAM: CHEST  2 VIEW  COMPARISON:  01/16/2015 chest radiograph at 1154 hours.  FINDINGS: There is a moderate RIGHT pleural effusion. Collapse/consolidation at the RIGHT lung base involving the RIGHT middle and RIGHT lower lobe. LEFT lung appears within normal limits aside from basilar atelectasis. Cardiopericardial silhouette partially obscured. Aortic arch atherosclerosis.  There is fullness of the RIGHT hilum, which may represent relaxation atelectasis and retraction of the lung however a RIGHT hilar mass cannot be excluded.  IMPRESSION: Moderate unilateral RIGHT pleural effusion with collapse/consolidation of the RIGHT middle and RIGHT  lower lobes. RIGHT hilar fullness. The differential considerations are primarily pneumonia with parapneumonic effusion or malignant effusion. Consider thoracentesis with cytology contrast-enhanced chest CT may be useful for further evaluation.   Electronically Signed   By: Dereck Ligas M.D.   On: 01/16/2015 15:57   Dg Chest Right Decubitus  01/16/2015   CLINICAL DATA:  Chest pain and shortness of breath, acute onset. Initial encounter.  EXAM: CHEST - RIGHT DECUBITUS  COMPARISON:  Chest radiograph performed earlier today at 3:39 p.m.  FINDINGS: A moderate right-sided pleural effusion is again noted, demonstrating some degree of layering. Underlying right basilar airspace opacification is again seen. The right hilar prominence is not well assessed due to overlying soft tissues.  The cardiomediastinal silhouette remains grossly normal in size, though difficult to fully characterize. No acute osseous abnormalities are seen.  IMPRESSION: Moderate right-sided pleural effusion again noted, demonstrating some degree of layering. Underlying airspace opacification again seen. Known right hilar prominence not well assessed  due to overlying soft tissues. As before, the differential is primarily pneumonia with parapneumonic effusion, versus a malignant effusion. Contrast-enhanced CT of the chest, or diagnostic thoracentesis with cytology, may be helpful for further evaluation.   Electronically Signed   By: Garald Balding M.D.   On: 01/16/2015 19:11   Dg Chest 2v Repeat Same Day  01/17/2015   CLINICAL DATA:  Post right thoracentesis. Current history of hypertension, diabetes and chronic kidney disease.  EXAM: CHEST - 2 VIEW  COMPARISON:  01/15/2005 and earlier.  FINDINGS: No evidence of a right pneumothorax after thoracentesis. Residual moderately large right pleural effusion, slightly decreased in size. Dense consolidation in the right middle lobe and right lower lobe, unchanged. No new pulmonary parenchymal  abnormalities. Right hilar prominence, unchanged.  IMPRESSION: 1. No evidence of pneumothorax after right thoracentesis. 2. Slight decrease in size of the still moderately large right pleural effusion. 3. Dense consolidation in the right middle lobe and right lower lobe. 4. Right hilar prominence as noted on yesterday's examination. 5. No new abnormalities.   Electronically Signed   By: Evangeline Dakin M.D.   On: 01/17/2015 12:32     ASSESSMENT / PLAN:   1) Classic late parapneumic effusion on R clearly loculated  - tapped 7/2 results pending but strongly suspect she'll need vats and appears to be a good candidate   2) former smoker but no obvious sequelae   3) CRI on ACEi  With baseline 1.9 creat 2 y pta > rec  hold acei and hydrate and follow serial creat    Will return after results back from thoracentesis/ nothing else to add for now   Christinia Gully, MD Pulmonary and [place] [phone] After 5:30 PM or weekends, call [phone]

## 2015-01-18 ENCOUNTER — Inpatient Hospital Stay (HOSPITAL_COMMUNITY): Payer: Medicare Other

## 2015-01-18 DIAGNOSIS — N183 Chronic kidney disease, stage 3 unspecified: Secondary | ICD-10-CM

## 2015-01-18 DIAGNOSIS — N179 Acute kidney failure, unspecified: Secondary | ICD-10-CM

## 2015-01-18 DIAGNOSIS — I10 Essential (primary) hypertension: Secondary | ICD-10-CM

## 2015-01-18 LAB — CBC WITH DIFFERENTIAL/PLATELET
BASOS ABS: 0.1 10*3/uL (ref 0.0–0.1)
BASOS PCT: 1 % (ref 0–1)
Eosinophils Absolute: 0.4 10*3/uL (ref 0.0–0.7)
Eosinophils Relative: 5 % (ref 0–5)
HCT: 38.5 % (ref 36.0–46.0)
HEMOGLOBIN: 13.3 g/dL (ref 12.0–15.0)
LYMPHS ABS: 1.8 10*3/uL (ref 0.7–4.0)
Lymphocytes Relative: 21 % (ref 12–46)
MCH: 26.8 pg (ref 26.0–34.0)
MCHC: 34.5 g/dL (ref 30.0–36.0)
MCV: 77.6 fL — AB (ref 78.0–100.0)
MONO ABS: 1.1 10*3/uL — AB (ref 0.1–1.0)
Monocytes Relative: 12 % (ref 3–12)
Neutro Abs: 5.3 10*3/uL (ref 1.7–7.7)
Neutrophils Relative %: 61 % (ref 43–77)
Platelets: 267 10*3/uL (ref 150–400)
RBC: 4.96 MIL/uL (ref 3.87–5.11)
RDW: 13.1 % (ref 11.5–15.5)
WBC: 8.7 10*3/uL (ref 4.0–10.5)

## 2015-01-18 LAB — SODIUM, URINE, RANDOM: Sodium, Ur: <10 mmol/L

## 2015-01-18 LAB — COMPREHENSIVE METABOLIC PANEL
ALBUMIN: 3.3 g/dL — AB (ref 3.5–5.0)
ALK PHOS: 62 U/L (ref 38–126)
ALT: 22 U/L (ref 14–54)
AST: 26 U/L (ref 15–41)
Anion gap: 12 (ref 5–15)
BUN: 26 mg/dL — AB (ref 6–20)
CHLORIDE: 101 mmol/L (ref 101–111)
CO2: 25 mmol/L (ref 22–32)
CREATININE: 2.17 mg/dL — AB (ref 0.44–1.00)
Calcium: 8.9 mg/dL (ref 8.9–10.3)
GFR calc Af Amer: 24 mL/min — ABNORMAL LOW (ref >60)
GFR, EST NON AFRICAN AMERICAN: 21 mL/min — AB (ref >60)
Glucose, Bld: 153 mg/dL — ABNORMAL HIGH (ref 65–99)
Potassium: 3.7 mmol/L (ref 3.5–5.1)
SODIUM: 138 mmol/L (ref 135–145)
Total Bilirubin: 0.5 mg/dL (ref 0.3–1.2)
Total Protein: 7.4 g/dL (ref 6.5–8.1)

## 2015-01-18 LAB — URINALYSIS, ROUTINE W REFLEX MICROSCOPIC
BILIRUBIN URINE: NEGATIVE
Glucose, UA: NEGATIVE mg/dL
HGB URINE DIPSTICK: NEGATIVE
KETONES UR: NEGATIVE mg/dL
Nitrite: NEGATIVE
PH: 5 (ref 5.0–8.0)
Protein, ur: NEGATIVE mg/dL
Specific Gravity, Urine: 1.015 (ref 1.005–1.030)
Urobilinogen, UA: 0.2 mg/dL (ref 0.0–1.0)

## 2015-01-18 LAB — URINE MICROSCOPIC-ADD ON

## 2015-01-18 LAB — STREP PNEUMONIAE URINARY ANTIGEN: STREP PNEUMO URINARY ANTIGEN: NEGATIVE

## 2015-01-18 LAB — PH, BODY FLUID: pH, Fluid: 7.5

## 2015-01-18 LAB — PROTIME-INR
INR: 1.18 (ref 0.00–1.49)
Prothrombin Time: 15.2 seconds (ref 11.6–15.2)

## 2015-01-18 LAB — CREATININE, URINE, RANDOM: Creatinine, Urine: 158.9 mg/dL

## 2015-01-18 MED ORDER — SODIUM CHLORIDE 0.9 % IV SOLN
250.0000 mg | Freq: Four times a day (QID) | INTRAVENOUS | Status: DC
  Filled 2015-01-18: qty 250

## 2015-01-18 MED ORDER — LEVOFLOXACIN IN D5W 500 MG/100ML IV SOLN: 500.0000 mg | INTRAVENOUS | Status: DC

## 2015-01-18 MED ORDER — ZOLPIDEM TARTRATE 5 MG PO TABS
5.0000 mg | ORAL_TABLET | Freq: Once | ORAL | Status: DC
  Filled 2015-01-18: qty 1

## 2015-01-18 MED ORDER — SODIUM CHLORIDE 0.9 % IV SOLN
500.0000 mg | Freq: Three times a day (TID) | INTRAVENOUS | Status: DC
  Administered 2015-01-18: 500 mg via INTRAVENOUS
  Filled 2015-01-18 (×2): qty 500

## 2015-01-18 MED ORDER — ZOLPIDEM TARTRATE 5 MG PO TABS
5.0000 mg | ORAL_TABLET | Freq: Once | ORAL | Status: AC
  Administered 2015-01-18: 5 mg via ORAL
  Filled 2015-01-18: qty 1

## 2015-01-18 MED ORDER — LEVOFLOXACIN IN D5W 750 MG/150ML IV SOLN
750.0000 mg | Freq: Once | INTRAVENOUS | Status: AC
  Administered 2015-01-18: 750 mg via INTRAVENOUS
  Filled 2015-01-18: qty 150

## 2015-01-18 NOTE — Progress Notes (Addendum)
[facility] Progress Note   April Kennedy TIR:[id number] DOB: [birth year] DOA: 01/16/2015 PCP: Kevan Ny, MD  Brief narrative: April Kennedy is a 81 y.o. female has medical history of hypertension, chronic kidney disease stage III, diabetes mellitus and hyperlipidemia who presented to the hospital after an x-ray done by her PCP showing a right pleural effusion. She admitted to a dry cough and shortness of breath with activity for one week. She was also found to have acute on chronic renal failure. She underwent a thoracentesis on 7/2 of the right-sided effusion to determine a diagnosis.   Subjective: Continues to have a mild cough productive of white sputum. Still feels that she is unable to take proper deep breaths. Has had no appetite for one week.  Assessment/Plan: Principal Problem:   Pleural effusion, right -Symptoms of shortness of breath, dry cough or cough productive of white sputum and lack of appetite for one week -Chest x-ray reveals underlying consolidation -Underwent thoracentesis on 7/2-effusion is found to be exudative with an LDH of 1546, WBC count of 1666 out of which 61% are neutrophils -Gram stain on pleural fluid revealed a few WBCs but no organisms-culture pending -Have started Levaquin for community-acquired pneumonia and have consulted cardiothoracic surgery today- CT surgery has switched her from Levaquin to [place] -Patient is not hypoxic -Obtain strep pneumo and legionella antigens  Active Problems: Acute renal failure on CKD3 -Baseline creatinine is about 1.4-1.7-presented with a creatinine of 2.35 which is only minimally improved today -Holding ACE inhibitor -obtain urine sodium and urine creatinine to determine a FeNa -Check UA and renal ultrasound  Elevated troponin -Minimal elevation in troponin with no upward trend-no cardiac workup needed    Glaucoma -Continue Lumigan and alphagan  Hypertension -Holding ACE inhibitor due to acute renal  failure -Continue amlodipine    Code Status: Full code Family Communication: Daughter at bedside Disposition Plan: Plan per CT surgery today DVT prophylaxis: Heparin Consultants:, Pulmonary/CT surgery Procedures: Thoracentesis 7/2-right-sided  Antibiotics: Anti-infectives    Start     Dose/Rate Route Frequency Ordered Stop   01/20/15 0800  levofloxacin (LEVAQUIN) IVPB 500 mg  Status:  Discontinued     500 mg 100 mL/hr over 60 Minutes Intravenous Every 48 hours 01/18/15 0709 01/18/15 0757   01/18/15 0900  imipenem-cilastatin (PRIMAXIN) 500 mg in sodium chloride 0.9 % 100 mL IVPB     500 mg 200 mL/hr over 30 Minutes Intravenous Every 8 hours 01/18/15 0755     01/18/15 0715  levofloxacin (LEVAQUIN) IVPB 750 mg     750 mg 100 mL/hr over 90 Minutes Intravenous  Once 01/18/15 0709 01/18/15 0940      Objective: Filed Weights   01/16/15 1522 01/16/15 1828  Weight: 83.575 kg (184 lb 4 oz) 82.555 kg (182 lb)    Intake/Output Summary (Last 24 hours) at 01/18/15 1009 Last data filed at 01/18/15 6761  Gross per 24 hour  Intake   1340 ml  Output      0 ml  Net   1340 ml     Vitals Filed Vitals:   01/17/15 1337 01/17/15 2042 01/18/15 0628 01/18/15 0923  BP: 152/68 125/67 134/67 132/91  Pulse: 99 108 102   Temp:  98.2 F (36.8 C) 99.6 F (37.6 C)   TempSrc:  Oral Oral   Resp:  18 18   Height:      Weight:      SpO2:  97% 96%     Exam:  General:  Pt is  alert, not in acute distress  HEENT: No icterus, No thrush, oral mucosa moist  Cardiovascular: regular rate and rhythm, S1/S2 No murmur  Respiratory: clear to auscultation bilaterally - decreased breath sounds in right lower lung field  Abdomen: Soft, +Bowel sounds, non tender, non distended, no guarding  MSK: No LE edema, cyanosis or clubbing  Data Reviewed: Basic Metabolic Panel:  Recent Labs Lab 01/16/15 1532 01/17/15 0620 01/18/15 0306  NA 138 137 138  K 3.9 3.5 3.7  CL 101 100* 101  CO2 '26 28 25   '$ GLUCOSE 189* 153* 153*  BUN 30* 26* 26*  CREATININE 2.35* 2.08* 2.17*  CALCIUM 8.8* 8.8* 8.9   Liver Function Tests:  Recent Labs Lab 01/17/15 1100 01/18/15 0306  AST  --  26  ALT  --  22  ALKPHOS  --  62  BILITOT  --  0.5  PROT 7.3 7.4  ALBUMIN  --  3.3*   No results for input(s): LIPASE, AMYLASE in the last 168 hours. No results for input(s): AMMONIA in the last 168 hours. CBC:  Recent Labs Lab 01/16/15 1532 01/17/15 0620 01/18/15 0306  WBC 7.9 9.6 8.7  NEUTROABS 5.2  --  5.3  HGB 12.5 13.3 13.3  HCT 37.0 39.5 38.5  MCV 78.4 79.6 77.6*  PLT 232 239 267   Cardiac Enzymes:  Recent Labs Lab 01/16/15 1532 01/16/15 2001 01/17/15 0022 01/17/15 0620  TROPONINI 0.15* 0.18* 0.16* 0.15*   BNP (last 3 results)  Recent Labs  01/16/15 1532  BNP 32.3    ProBNP (last 3 results) No results for input(s): PROBNP in the last 8760 hours.  CBG: No results for input(s): GLUCAP in the last 168 hours.  Recent Results (from the past 240 hour(s))  Culture, body fluid-bottle     Status: None (Preliminary result)   Collection Time: 01/17/15 10:56 AM  Result Value Ref Range Status   Specimen Description FLUID RIGHT PLEURAL  Final   Special Requests NONE  Final   Gram Stain   Final    FEW WBC PRESENT,BOTH PMN AND MONONUCLEAR NO ORGANISMS SEEN NOT CYTOSPUN    Culture PENDING  Incomplete   Report Status PENDING  Incomplete     Studies: Dg Chest 2 View  01/18/2015   CLINICAL DATA:  Right-sided thoracentesis. Persistent shortness of breath.  EXAM: CHEST  2 VIEW  COMPARISON:  1 day prior  FINDINGS: Midline trachea. Mild cardiomegaly with a mildly tortuous thoracic aorta. Atherosclerosis in the transverse aorta. A moderate right pleural effusion is not significantly changed. No left-sided pleural fluid. No pneumothorax. Right hilar soft tissue fullness is similar. Clear left lung. Right base airspace disease is not significantly changed.  IMPRESSION: No significant change  since one day prior.  Moderate right pleural effusion with adjacent airspace disease.  Cardiomegaly with transverse aortic atherosclerosis.  Right hilar soft tissue fullness, as before.   Electronically Signed   By: Abigail Miyamoto M.D.   On: 01/18/2015 09:12   Dg Chest 2 View  01/16/2015   CLINICAL DATA:  Fluid on the RIGHT lung. Short of breath. Cough for 1 week.  EXAM: CHEST  2 VIEW  COMPARISON:  01/16/2015 chest radiograph at 1154 hours.  FINDINGS: There is a moderate RIGHT pleural effusion. Collapse/consolidation at the RIGHT lung base involving the RIGHT middle and RIGHT lower lobe. LEFT lung appears within normal limits aside from basilar atelectasis. Cardiopericardial silhouette partially obscured. Aortic arch atherosclerosis.  There is fullness of the RIGHT hilum, which may  represent relaxation atelectasis and retraction of the lung however a RIGHT hilar mass cannot be excluded.  IMPRESSION: Moderate unilateral RIGHT pleural effusion with collapse/consolidation of the RIGHT middle and RIGHT lower lobes. RIGHT hilar fullness. The differential considerations are primarily pneumonia with parapneumonic effusion or malignant effusion. Consider thoracentesis with cytology contrast-enhanced chest CT may be useful for further evaluation.   Electronically Signed   By: Dereck Ligas M.D.   On: 01/16/2015 15:57   Dg Chest 2 View  01/16/2015   CLINICAL DATA:  Shortness of breath with exertion, chest tightness and fatigue for 10 days; cold symptoms 4 weeks ago without full recovery; history of asthma and diabetes, discontinued smoking 2 years ago.  EXAM: CHEST  2 VIEW  COMPARISON:  PA and lateral chest of Dec 03, 2010  FINDINGS: There is volume loss on the right occupying approximately 1-1/3 to 1/2 of the lung volume. There is no mediastinal shift. The right upper lobe is clear as is the left lung. The heart and pulmonary vascularity are normal. The bony thorax is unremarkable.  IMPRESSION: Findings compatible with  large right pleural effusion. Underlying parenchymal consolidation is suspected as well. Further evaluation with ultrasound would be useful to judge if patient is a candidate for thoracentesis. Chest CT scanning will likely be needed to evaluate the right pleural space more completely.   Electronically Signed   By: David  Martinique M.D.   On: 01/16/2015 11:50   Dg Chest Right Decubitus  01/16/2015   CLINICAL DATA:  Chest pain and shortness of breath, acute onset. Initial encounter.  EXAM: CHEST - RIGHT DECUBITUS  COMPARISON:  Chest radiograph performed earlier today at 3:39 p.m.  FINDINGS: A moderate right-sided pleural effusion is again noted, demonstrating some degree of layering. Underlying right basilar airspace opacification is again seen. The right hilar prominence is not well assessed due to overlying soft tissues.  The cardiomediastinal silhouette remains grossly normal in size, though difficult to fully characterize. No acute osseous abnormalities are seen.  IMPRESSION: Moderate right-sided pleural effusion again noted, demonstrating some degree of layering. Underlying airspace opacification again seen. Known right hilar prominence not well assessed due to overlying soft tissues. As before, the differential is primarily pneumonia with parapneumonic effusion, versus a malignant effusion. Contrast-enhanced CT of the chest, or diagnostic thoracentesis with cytology, may be helpful for further evaluation.   Electronically Signed   By: Garald Balding M.D.   On: 01/16/2015 19:11   Dg Chest 2v Repeat Same Day  01/17/2015   CLINICAL DATA:  Post right thoracentesis. Current history of hypertension, diabetes and chronic kidney disease.  EXAM: CHEST - 2 VIEW  COMPARISON:  01/15/2005 and earlier.  FINDINGS: No evidence of a right pneumothorax after thoracentesis. Residual moderately large right pleural effusion, slightly decreased in size. Dense consolidation in the right middle lobe and right lower lobe, unchanged.  No new pulmonary parenchymal abnormalities. Right hilar prominence, unchanged.  IMPRESSION: 1. No evidence of pneumothorax after right thoracentesis. 2. Slight decrease in size of the still moderately large right pleural effusion. 3. Dense consolidation in the right middle lobe and right lower lobe. 4. Right hilar prominence as noted on yesterday's examination. 5. No new abnormalities.   Electronically Signed   By: Evangeline Dakin M.D.   On: 01/17/2015 12:32   US Thoracentesis Asp Pleural Space W/img Guide  01/18/2015   CLINICAL DATA:  Right pleural effusion  EXAM: EXAM THORACENTESIS WITH ULTRASOUND  TECHNIQUE: The procedure, risks (including but not limited to bleeding,  infection, organ damage ), benefits, and alternatives were explained to the patient. Questions regarding the procedure were encouraged and answered. The patient understands and consents to the procedure. Survey ultrasound of the right hemithorax was performed and an appropriate skin entry site was localized. Site was marked, prepped with Betadine, draped in usual sterile fashion, infiltrated locally with 1% lidocaine.  The Saf-T-Centesis needle was advanced into the pleural space. Clear yellow fluid returned. 800 mL was removed. Post procedure imaging shows minimal residual fluid. The patient tolerated procedure well.  COMPLICATIONS: COMPLICATIONS None immediate  IMPRESSION: Technically successful ultrasound-guided right thoracentesis. Follow-up chest radiograph pending.   Electronically Signed   By: Lucrezia Europe M.D.   On: 01/18/2015 09:37    Scheduled Meds:  Scheduled Meds: . amLODipine  10 mg Oral Daily  . brimonidine  1 drop Both Eyes TID  . heparin  5,000 Units Subcutaneous 3 times per day  . imipenem-cilastatin  500 mg Intravenous Q8H  . latanoprost  1 drop Both Eyes QHS  . multivitamin with minerals  1 tablet Oral Daily  . sodium chloride  3 mL Intravenous Q12H   Continuous Infusions:   Time spent on care of this patient: 40  min   [place], MD 01/18/2015, 10:09 AM  LOS: 2 days   [facility] Office  [phone] Pager - Text Page per [email or web address] If 7PM-7AM, please contact night-coverage [email or web address]

## 2015-01-18 NOTE — Progress Notes (Signed)
Utilization Review Completed.Donne Anon T7/09/2014

## 2015-01-18 NOTE — Consult Note (Signed)
[address]       [place] [address]             [phone]        Brittony M [place] Medical Record #[id number] Date of Birth: [birth year]  Referring: No ref. provider found Primary Care: Kevan Ny, MD  Chief Complaint:    Chief Complaint  Patient presents with  . Shortness of Breath       Abnormal Chest XRAY  History of Present Illness:     I was asked to evaluate this 81 year old AA female reformed smoker who is admitted to the hospital with recent onset of shortness of breath, weight loss, and a moderate-large right pleural effusion. She has had some intermittent right chest discomfort and a dry cough. She states she had a bad cold a few weeks ago. She denies hemoptysis. Her last chest x-ray was 2012 which showed no active disease other than some COPD changes. The patient was admitted to the hospital service, placed on IV Levaquin, and a thoracentesis was performed. This removed 800 cc of fluid-elevated LDH, no bacteria on Gram stain, protein level 5.0 and cytology is pending.  The patient underwent a CT scan-without contrast due to elevated creatinine-which shows a right upper lobe mass 2.5 cm, significant mediastinal adenopathy, and smaller pulmonary nodules in the right lung. There also is enlarged left supraclavicular lymph nodes. 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  Current Activity/ Functional Status: Able to perform activities of daily living   Zubrod Score: At the time of surgery this patient's most appropriate activity status/level should be described as: '[]'$     0    Normal activity, no symptoms '[]'$     1    Restricted in physical strenuous activity but ambulatory, able to do out light work '[x]'$     2    Ambulatory and capable of self care, unable to do work activities, up and about                 more than 50%  Of the time                            '[]'$     3    Only limited self care, in bed greater  than 50% of waking hours '[]'$     4    Completely disabled, no self care, confined to bed or chair '[]'$     5    Moribund  Past Medical History  Diagnosis Date  . Hypertension   . Glaucoma   . Chronic kidney disease   . Neuromuscular disorder   . Diabetes mellitus without complication   . Asthma   . Hyperlipidemia     History reviewed. No pertinent past surgical history.  History  Smoking status  . Former Smoker  Smokeless tobacco  . Not on file    History  Alcohol Use No    History   Social History  . Marital Status: Widowed    Spouse Name: N/A  . Number of Children: N/A  . Years of Education: N/A   Occupational History  . Not on file.   Social History Main Topics  . Smoking status: Former Research scientist (life sciences)  . Smokeless tobacco: Not on file  . Alcohol Use: No  . Drug Use: Not on file  . Sexual Activity: Not on file   Other Topics Concern  . Not  on file   Social History Narrative    Allergies  Allergen Reactions  . Sulfa Antibiotics     Hives/ itching     Current Facility-Administered Medications  Medication Dose Route Frequency Provider Last Rate Last Dose  . 0.9 %  sodium chloride infusion  250 mL Intravenous PRN Geradine Girt, DO      . acetaminophen (TYLENOL) tablet 650 mg  650 mg Oral Q6H PRN Geradine Girt, DO       Or  . acetaminophen (TYLENOL) suppository 650 mg  650 mg Rectal Q6H PRN Geradine Girt, DO      . amLODipine (NORVASC) tablet 10 mg  10 mg Oral Daily Geradine Girt, DO   10 mg at 01/18/15 8850  . brimonidine (ALPHAGAN) 0.2 % ophthalmic solution 1 drop  1 drop Both Eyes TID Geradine Girt, DO   1 drop at 01/18/15 0924  . heparin injection 5,000 Units  5,000 Units Subcutaneous 3 times per day Geradine Girt, DO   5,000 Units at 01/18/15 1340  . hydrALAZINE (APRESOLINE) injection 10 mg  10 mg Intravenous Q6H PRN Geradine Girt, DO      . latanoprost (XALATAN) 0.005 % ophthalmic solution 1 drop  1 drop Both Eyes QHS Geradine Girt, DO   1 drop at  01/17/15 2118  . multivitamin with minerals tablet 1 tablet  1 tablet Oral Daily Geradine Girt, DO   1 tablet at 01/18/15 [phone]  . sodium chloride 0.9 % injection 3 mL  3 mL Intravenous Q12H Jessica U Vann, DO   3 mL at 01/17/15 2200  . sodium chloride 0.9 % injection 3 mL  3 mL Intravenous PRN Geradine Girt, DO        Prescriptions prior to admission  Medication Sig Dispense Refill Last Dose  . amLODipine-benazepril (LOTREL) 10-20 MG per capsule Take 1 capsule by mouth daily.   01/16/2015 at Unknown time  . bimatoprost (LUMIGAN) 0.01 % SOLN Place 1 drop into both eyes at bedtime.    01/15/2015 at Unknown time  . brimonidine (ALPHAGAN P) 0.1 % SOLN Place 1 drop into both eyes 3 (three) times daily.    01/16/2015 at Unknown time  . Multiple Vitamin (MULTIVITAMIN) tablet Take 1 tablet by mouth daily.   01/15/2015 at Unknown time    Family History  Problem Relation Age of Onset  . Hypertension       Review of Systems:       Cardiac Review of Systems: Y or N  Chest Pain [ mild on right side   ]  Resting SOB [    no] Exertional SOB  [ yes ]  Lamarr Lulas  ]   Pedal Edema [no   ]    Palpitations [ no ] Syncope  [ no ]   Presyncope [  no ] Patient had echocardiogram earlier this year showing normal LV systolic function EF 12-87 percent  General Review of Systems: [Y] = yes [  ]=no Constitional: recent weight change [ yes loss 5-10 pounds ]; anorexia [  ]; fatigue [place].Blacker  ]; nausea [  ]; night sweats [  ]; fever [  ]; or chills [  ]  Dental: poor dentition[  ]; Last Dentist visit:Dental plates   Eye : blurred vision [  ]; diplopia [   ]; vision changes [  ];  Amaurosis fugax[  ]; Resp: cough [ yes ];  wheezing[  ];  hemoptysis[  ]; shortness of breath[ yes ]; paroxysmal nocturnal dyspnea[  ]; dyspnea on exertion[yes  ]; or orthopnea[  ];  GI:  gallstones[  ], vomiting[  ];  dysphagia[  ]; melena[  ];  hematochezia [  ]; heartburn[  ];   Hx  of  Colonoscopy[  ]; GU: kidney stones [  ]; hematuria[  ];   dysuria [  ];  nocturia[  ];  history of     obstruction [  ]; urinary frequency [  ]             Skin: rash, swelling[  ];, hair loss[  ];  peripheral edema[  ];  or itching[  ]; Musculosketetal: myalgias[  ];  joint swelling[  ];  joint erythema[  ];  joint pain[  ];  back pain[  ];  Heme/Lymph: bruising[  ];  bleeding[  ];  anemia[  ];  Neuro: TIA[  ];  headaches[  ];  stroke[  ];  vertigo[  ];  seizures[  ];   paresthesias[  ];  difficulty walking[  ];  Psych:depression[  ]; anxiety[  ];  Endocrine: diabetes[ yes ];  thyroid dysfunction[  ];  Immunizations: Flu [  ]; Pneumococcal[  ];  Other:Right-hand dominant, history of hypertension, baseline creatinine 2.2  Physical Exam: BP 130/61 mmHg  Pulse 105  Temp(Src) 98.1 F (36.7 C) (Oral)  Resp 19  Ht '5\' 4"'$  (1.626 m)  Wt 182 lb (82.555 kg)  BMI 31.22 kg/m2  SpO2 96%       Physical Exam  General: Elderly  AA I in no acute distress  HEENT: Normocephalic pupils with surgical changes, dentition adequate Neck: Supple without JVD, adenopathy, or bruit-no palpable left supra- clavicular node  Chest: Clear to auscultation with decreased breath sounds at right base, no rhonchi, no tenderness             or deformity Cardiovascular: Regular rate and rhythm, no murmur, no gallop, peripheral pulses             palpable in all extremities Abdomen:  Soft, nontender, no palpable mass or organomegaly Extremities: Warm, well-perfused, no clubbing cyanosis edema or tenderness,              no venous stasis changes of the legs Rectal/GU: Deferred Neuro: Grossly non--focal and symmetrical throughout Skin: Clean and dry without rash or ulceration   Diagnostic Studies & Laboratory data:   CT scan images independently reviewed-show right upper lobe mass, several nodular densities in the right lung and extensive mediastinal adenopathy and peritracheal adenopathy   Recent Radiology  Findings:   Dg Chest 2 View  01/18/2015   CLINICAL DATA:  Right-sided thoracentesis. Persistent shortness of breath.  EXAM: CHEST  2 VIEW  COMPARISON:  1 day prior  FINDINGS: Midline trachea. Mild cardiomegaly with a mildly tortuous thoracic aorta. Atherosclerosis in the transverse aorta. A moderate right pleural effusion is not significantly changed. No left-sided pleural fluid. No pneumothorax. Right hilar soft tissue fullness is similar. Clear left lung. Right base airspace disease is not significantly changed.  IMPRESSION: No significant change since one day prior.  Moderate right pleural effusion with adjacent airspace disease.  Cardiomegaly with transverse aortic atherosclerosis.  Right  hilar soft tissue fullness, as before.   Electronically Signed   By: Abigail Miyamoto M.D.   On: 01/18/2015 09:12   Dg Chest 2 View  01/16/2015   CLINICAL DATA:  Fluid on the RIGHT lung. Short of breath. Cough for 1 week.  EXAM: CHEST  2 VIEW  COMPARISON:  01/16/2015 chest radiograph at 1154 hours.  FINDINGS: There is a moderate RIGHT pleural effusion. Collapse/consolidation at the RIGHT lung base involving the RIGHT middle and RIGHT lower lobe. LEFT lung appears within normal limits aside from basilar atelectasis. Cardiopericardial silhouette partially obscured. Aortic arch atherosclerosis.  There is fullness of the RIGHT hilum, which may represent relaxation atelectasis and retraction of the lung however a RIGHT hilar mass cannot be excluded.  IMPRESSION: Moderate unilateral RIGHT pleural effusion with collapse/consolidation of the RIGHT middle and RIGHT lower lobes. RIGHT hilar fullness. The differential considerations are primarily pneumonia with parapneumonic effusion or malignant effusion. Consider thoracentesis with cytology contrast-enhanced chest CT may be useful for further evaluation.   Electronically Signed   By: Dereck Ligas M.D.   On: 01/16/2015 15:57   Ct Chest Wo Contrast  01/18/2015   CLINICAL DATA:   Worsening cough and shortness of breath.  Empyema.  EXAM: CT CHEST WITHOUT CONTRAST  TECHNIQUE: Multidetector CT imaging of the chest was performed following the standard protocol without IV contrast.  COMPARISON:  Chest radiograph 01/18/2015  FINDINGS: There is extensive mediastinal lymphadenopathy. Right paratracheal lymph node on sequence 201, image 15 measures 1.6 cm in the short axis. Precarinal lymph node measures 1.9 cm on image 22. Enlarged subcarinal tissue measures 2.6 cm in the short axis on image 29. There is also soft tissue fullness in the right suprahilar region on image 27. Evaluation of the hilar disease is difficult without intravenous contrast. There is right pleural fluid throughout the right chest which appears to be complex or mildly loculated. There is no significant left pleural fluid. No evidence for axillary lymphadenopathy. There is bilateral supraclavicular lymphadenopathy. Largest left supraclavicular lymph node measures 1.4 cm in the short axis on image 5. Images of the upper abdomen suggest scarring or atrophy in the right kidney. Possible cyst involving left kidney upper pole. No gross abnormality in the visualized liver.  The trachea and mainstem bronchi are are patent. There is volume loss in the right lower lobe related to the pleural fluid. There is also volume loss with air bronchograms in the right middle lobe. There are small nodular densities scattered throughout the right upper lobe. Dominant right upper lobe nodule measures 1.0 cm on sequence 205, image 30. In addition there may be a primary mass in the right upper lobe that roughly measures up to 2.4 cm on sequence 205, image 19. This lesion is along the medial right upper lobe. No significant nodularity or airspace disease in the left lung.  No acute bone abnormality.  IMPRESSION: There are scattered right pulmonary nodules and concern for a primary lesion along the medial right upper lobe. In addition, there is extensive  mediastinal, right hilar and supraclavicular lymphadenopathy. Findings are concerning for a neoplastic process and the lesion along the medial right upper lobe could be the primary lesion. The supraclavicular lymphadenopathy may amendable to percutaneous sampling.  Small to moderate sized loculated right pleural effusion with volume loss in the right lower lobe and right middle lobe.  These results were called by telephone at the time of interpretation on 01/18/2015 at 10:37 am to Dr. Wynelle Cleveland , who verbally acknowledged  these results.   Electronically Signed   By: Markus Daft M.D.   On: 01/18/2015 10:41   Dg Chest Right Decubitus  01/16/2015   CLINICAL DATA:  Chest pain and shortness of breath, acute onset. Initial encounter.  EXAM: CHEST - RIGHT DECUBITUS  COMPARISON:  Chest radiograph performed earlier today at 3:39 p.m.  FINDINGS: A moderate right-sided pleural effusion is again noted, demonstrating some degree of layering. Underlying right basilar airspace opacification is again seen. The right hilar prominence is not well assessed due to overlying soft tissues.  The cardiomediastinal silhouette remains grossly normal in size, though difficult to fully characterize. No acute osseous abnormalities are seen.  IMPRESSION: Moderate right-sided pleural effusion again noted, demonstrating some degree of layering. Underlying airspace opacification again seen. Known right hilar prominence not well assessed due to overlying soft tissues. As before, the differential is primarily pneumonia with parapneumonic effusion, versus a malignant effusion. Contrast-enhanced CT of the chest, or diagnostic thoracentesis with cytology, may be helpful for further evaluation.   Electronically Signed   By: Garald Balding M.D.   On: 01/16/2015 19:11   US Renal  01/18/2015   CLINICAL DATA:  Acute renal failure, history hypertension, chronic kidney disease, diabetes mellitus, hyperlipidemia, former smoker  EXAM: RENAL / URINARY TRACT  ULTRASOUND COMPLETE  COMPARISON:  None  FINDINGS: Right Kidney:  Length: 8.6 cm. Normal cortical thickness and echogenicity. No solid mass, hydronephrosis or shadowing calcification. Tiny hypoechoic nodule at inferior pole 11 x 10 x 9 mm question cyst.  Left Kidney:  Length: 10.6 cm. Normal cortical thickness and echogenicity. Multiple cysts largest no solid mass, hydronephrosis or shadowing calcification. 2.2 x 2.0 x 2.3 cm at upper pole.  Bladder:  Grossly normal appearance for partially distended state.  RIGHT pleural effusion noted.  Insula in the noted increased echogenicity liver likely representing fatty infiltration.  IMPRESSION: Probable small BILATERAL renal cysts.  No evidence of solid renal mass or hydronephrosis.  Small RIGHT pleural effusion.  Probable fatty infiltration of liver.   Electronically Signed   By: Lavonia Dana M.D.   On: 01/18/2015 10:56   Dg Chest 2v Repeat Same Day  01/17/2015   CLINICAL DATA:  Post right thoracentesis. Current history of hypertension, diabetes and chronic kidney disease.  EXAM: CHEST - 2 VIEW  COMPARISON:  01/15/2005 and earlier.  FINDINGS: No evidence of a right pneumothorax after thoracentesis. Residual moderately large right pleural effusion, slightly decreased in size. Dense consolidation in the right middle lobe and right lower lobe, unchanged. No new pulmonary parenchymal abnormalities. Right hilar prominence, unchanged.  IMPRESSION: 1. No evidence of pneumothorax after right thoracentesis. 2. Slight decrease in size of the still moderately large right pleural effusion. 3. Dense consolidation in the right middle lobe and right lower lobe. 4. Right hilar prominence as noted on yesterday's examination. 5. No new abnormalities.   Electronically Signed   By: Evangeline Dakin M.D.   On: 01/17/2015 12:32   US Thoracentesis Asp Pleural Space W/img Guide  01/18/2015   CLINICAL DATA:  Right pleural effusion  EXAM: EXAM THORACENTESIS WITH ULTRASOUND  TECHNIQUE: The  procedure, risks (including but not limited to bleeding, infection, organ damage ), benefits, and alternatives were explained to the patient. Questions regarding the procedure were encouraged and answered. The patient understands and consents to the procedure. Survey ultrasound of the right hemithorax was performed and an appropriate skin entry site was localized. Site was marked, prepped with Betadine, draped in usual sterile fashion, infiltrated locally  with 1% lidocaine.  The Saf-T-Centesis needle was advanced into the pleural space. Clear yellow fluid returned. 800 mL was removed. Post procedure imaging shows minimal residual fluid. The patient tolerated procedure well.  COMPLICATIONS: COMPLICATIONS None immediate  IMPRESSION: Technically successful ultrasound-guided right thoracentesis. Follow-up chest radiograph pending.   Electronically Signed   By: Lucrezia Europe M.D.   On: 01/18/2015 09:37     I have independently reviewed the above radiologic studies.  Recent Lab Findings: Lab Results  Component Value Date   WBC 8.7 01/18/2015   HGB 13.3 01/18/2015   HCT 38.5 01/18/2015   PLT 267 01/18/2015   GLUCOSE 153* 01/18/2015   ALT 22 01/18/2015   AST 26 01/18/2015   NA 138 01/18/2015   K 3.7 01/18/2015   CL 101 01/18/2015   CREATININE 2.17* 01/18/2015   BUN 26* 01/18/2015   CO2 25 01/18/2015   INR 1.18 01/18/2015   HGBA1C 5.9 11/10/2011      Assessment / Plan:     Patient presents with shortness of breath and a right pleural effusion. Clinical history of possible pneumonia  -- however CT scan is more consistent with a primary pulmonary malignancy with probable malignant effusion. Thoracentesis has been performed and pleural fluid cytology is still pending. If this is diagnostic of a malignancy then surgical biopsy-tissue sampling may not be necessary. If pleural fluid is negative then I would recommend bronchoscopy and endobronchial biopsy of mediastinal nodes and would consider placement  right Pleurx catheter at same time while patient is under anesthesia.  We'll await results from cytology on thoracentesis of right pleural fluid.   the situation and plan was discussed with the patient and her family.     I '@ME1'$ @ 01/18/2015 3:20 PM

## 2015-01-18 NOTE — Progress Notes (Addendum)
PHARMACY ANTIBIOTIC CONSULT  81yo female c/o worsening cough/SOB over the past week, CXR shows consolidation in right middle and lower lobes, to begin IV ABX.  Will give Levaquin '750mg'$  IV x1 followed by '500mg'$  IV Q48H for CrCl 20 ml/min and monitor CBC and Cx.  Wynona Neat, PharmD, BCPS 01/18/2015 7:19 AM   Addendum:  CVTS has changed abx to primaxin instead.   Change primaxin to '250mg'$  IV q6  Onnie Boer, PharmD Pager: [phone] 01/18/2015 11:24 AM

## 2015-01-18 NOTE — Progress Notes (Signed)
PULMONARY / CRITICAL CARE MEDICINE   Name: April Kennedy MRN: [id number] DOB: 08-04-1933    ADMISSION DATE:  01/16/2015 CONSULTATION DATE:  01/17/15  REFERRING MD :  [facility]   CHIEF COMPLAINT:  Dyspnea  INITIAL PRESENTATION:  80 yobf quit smoking 2014 with no apparent sequelae/ restrictions then "really bad cold" late May 2016 never really got over it then much worse cough/sob x 1 week pta with mostly white mucus production but unable to lie flat due to sob so admitted 7/1 with new R effusion noted and pccm service asked to eval am 7/2      SIGNIFICANT EVENTS R  thoracentesis 7/2 :  800 cc Exudative  With very high LDH and nl glucose / wbc 1666  P> L, no org seen with post procedure cxr with persistent moderate effusion      SUBJECTIVE:  Comfortable sitting up in bed on 02 2.5 lpm 02 but still not comfortable   VITAL SIGNS: Temp:  [97.8 F (36.6 C)-99.6 F (37.6 C)] 99.6 F (37.6 C) (07/03 0628) Pulse Rate:  [93-108] 102 (07/03 0628) Resp:  [18] 18 (07/03 0628) BP: (119-152)/(50-91) 132/91 mmHg (07/03 0923) SpO2:  [96 %-98 %] 96 % (07/03 0628) HEMODYNAMICS:   VENTILATOR SETTINGS:   INTAKE / OUTPUT:  Intake/Output Summary (Last 24 hours) at 01/18/15 1031 Last data filed at 01/18/15 0277  Gross per 24 hour  Intake   1340 ml  Output      0 ml  Net   1340 ml    PHYSICAL EXAMINATION: General:  Minimally uncomfortable appearing elderly bf nad  Pt alert, approp    No jvd Oropharynx clear Neck supple Lungs with a decreased bs dullness at R base  RRR no s3 or or sign murmur Abd obese with excursion >> Extr wam with no edema or clubbing noted Neuro  No motor deficits   LABS:  CBC  Recent Labs Lab 01/16/15 1532 01/17/15 0620 01/18/15 0306  WBC 7.9 9.6 8.7  HGB 12.5 13.3 13.3  HCT 37.0 39.5 38.5  PLT 232 239 267   Coag's  Recent Labs Lab 01/18/15 0306  INR 1.18   BMET  Recent Labs Lab 01/16/15 1532 01/17/15 0620 01/18/15 0306  NA 138 137 138   K 3.9 3.5 3.7  CL 101 100* 101  CO2 '26 28 25  '$ BUN 30* 26* 26*  CREATININE 2.35* 2.08* 2.17*  GLUCOSE 189* 153* 153*   Electrolytes  Recent Labs Lab 01/16/15 1532 01/17/15 0620 01/18/15 0306  CALCIUM 8.8* 8.8* 8.9   Sepsis Markers No results for input(s): LATICACIDVEN, PROCALCITON, O2SATVEN in the last 168 hours. ABG No results for input(s): PHART, PCO2ART, PO2ART in the last 168 hours. Liver Enzymes  Recent Labs Lab 01/18/15 0306  AST 26  ALT 22  ALKPHOS 62  BILITOT 0.5  ALBUMIN 3.3*   Cardiac Enzymes  Recent Labs Lab 01/16/15 2001 01/17/15 0022 01/17/15 0620  TROPONINI 0.18* 0.16* 0.15*   Glucose No results for input(s): GLUCAP in the last 168 hours.  Imaging Dg Chest 2 View  01/18/2015   CLINICAL DATA:  Right-sided thoracentesis. Persistent shortness of breath.  EXAM: CHEST  2 VIEW  COMPARISON:  1 day prior  FINDINGS: Midline trachea. Mild cardiomegaly with a mildly tortuous thoracic aorta. Atherosclerosis in the transverse aorta. A moderate right pleural effusion is not significantly changed. No left-sided pleural fluid. No pneumothorax. Right hilar soft tissue fullness is similar. Clear left lung. Right base airspace disease is not significantly  changed.  IMPRESSION: No significant change since one day prior.  Moderate right pleural effusion with adjacent airspace disease.  Cardiomegaly with transverse aortic atherosclerosis.  Right hilar soft tissue fullness, as before.   Electronically Signed   By: Abigail Miyamoto M.D.   On: 01/18/2015 09:12   Dg Chest 2v Repeat Same Day  01/17/2015   CLINICAL DATA:  Post right thoracentesis. Current history of hypertension, diabetes and chronic kidney disease.  EXAM: CHEST - 2 VIEW  COMPARISON:  01/15/2005 and earlier.  FINDINGS: No evidence of a right pneumothorax after thoracentesis. Residual moderately large right pleural effusion, slightly decreased in size. Dense consolidation in the right middle lobe and right lower lobe,  unchanged. No new pulmonary parenchymal abnormalities. Right hilar prominence, unchanged.  IMPRESSION: 1. No evidence of pneumothorax after right thoracentesis. 2. Slight decrease in size of the still moderately large right pleural effusion. 3. Dense consolidation in the right middle lobe and right lower lobe. 4. Right hilar prominence as noted on yesterday's examination. 5. No new abnormalities.   Electronically Signed   By: Evangeline Dakin M.D.   On: 01/17/2015 12:32   US Thoracentesis Asp Pleural Space W/img Guide  01/18/2015   CLINICAL DATA:  Right pleural effusion  EXAM: EXAM THORACENTESIS WITH ULTRASOUND  TECHNIQUE: The procedure, risks (including but not limited to bleeding, infection, organ damage ), benefits, and alternatives were explained to the patient. Questions regarding the procedure were encouraged and answered. The patient understands and consents to the procedure. Survey ultrasound of the right hemithorax was performed and an appropriate skin entry site was localized. Site was marked, prepped with Betadine, draped in usual sterile fashion, infiltrated locally with 1% lidocaine.  The Saf-T-Centesis needle was advanced into the pleural space. Clear yellow fluid returned. 800 mL was removed. Post procedure imaging shows minimal residual fluid. The patient tolerated procedure well.  COMPLICATIONS: COMPLICATIONS None immediate  IMPRESSION: Technically successful ultrasound-guided right thoracentesis. Follow-up chest radiograph pending.   Electronically Signed   By: Lucrezia Europe M.D.   On: 01/18/2015 09:37     ASSESSMENT / PLAN:   1) Classic late parapneumic effusion on R clearly loculated  - tapped 7/2 > not empyema but still  strongly suspect she'll need vats and appears to be a good candidate   2) former smoker but no obvious sequelae   3) CRI on ACEi  With baseline 1.9 creat 2 y pta > rec continue  hold acei and hydrate and follow serial creat    Dr Lucianne Lei tright following now and I have  nothing to add at this point   Please call if needed    Christinia Gully, MD [facility] [place] [phone] After 5:30 PM or weekends, call [phone]

## 2015-01-19 DIAGNOSIS — R599 Enlarged lymph nodes, unspecified: Secondary | ICD-10-CM | POA: Insufficient documentation

## 2015-01-19 LAB — BASIC METABOLIC PANEL
Anion gap: 9 (ref 5–15)
BUN: 29 mg/dL — AB (ref 6–20)
CO2: 25 mmol/L (ref 22–32)
Calcium: 8.3 mg/dL — ABNORMAL LOW (ref 8.9–10.3)
Chloride: 102 mmol/L (ref 101–111)
Creatinine, Ser: 2.27 mg/dL — ABNORMAL HIGH (ref 0.44–1.00)
GFR calc non Af Amer: 20 mL/min — ABNORMAL LOW (ref >60)
GFR, EST AFRICAN AMERICAN: 23 mL/min — AB (ref >60)
Glucose, Bld: 151 mg/dL — ABNORMAL HIGH (ref 65–99)
Potassium: 4.2 mmol/L (ref 3.5–5.1)
SODIUM: 136 mmol/L (ref 135–145)

## 2015-01-19 LAB — LEGIONELLA ANTIGEN, URINE

## 2015-01-19 MED ORDER — ZOLPIDEM TARTRATE 5 MG PO TABS
5.0000 mg | ORAL_TABLET | Freq: Every evening | ORAL | Status: DC | PRN
  Administered 2015-01-20 (×2): 5 mg via ORAL
  Filled 2015-01-19 (×3): qty 1

## 2015-01-19 MED ORDER — LEVALBUTEROL HCL 0.63 MG/3ML IN NEBU
0.6300 mg | INHALATION_SOLUTION | Freq: Three times a day (TID) | RESPIRATORY_TRACT | Status: DC | PRN
  Administered 2015-01-19 – 2015-01-21 (×4): 0.63 mg via RESPIRATORY_TRACT
  Filled 2015-01-19 (×5): qty 3

## 2015-01-19 MED ORDER — SODIUM CHLORIDE 0.9 % IV SOLN: INTRAVENOUS | Status: AC

## 2015-01-19 MED ORDER — SODIUM CHLORIDE 0.9 % IV SOLN
INTRAVENOUS | Status: DC
  Administered 2015-01-19: 22:00:00 via INTRAVENOUS
  Administered 2015-01-20: 100 mL/h via INTRAVENOUS

## 2015-01-19 MED ORDER — SODIUM CHLORIDE 0.9 % IV SOLN: INTRAVENOUS | Status: DC

## 2015-01-19 NOTE — Progress Notes (Signed)
  Subjective: Cytology still pending from fluid removed by right thoracentesis If cytology is negative we'll proceed with bronchoscopy and endobronchial biopsy of mediastinal nodes, placement of Pleurx catheter. If cytology is positive, recommend requesting cellblock sent for molecular markers by pathology stable  Objective: Vital signs in last 24 hours: Temp:  [98 F (36.7 C)-98.8 F (37.1 C)] 98 F (36.7 C) (07/04 0423) Pulse Rate:  [101-105] 101 (07/04 0423) Cardiac Rhythm:  [-] Sinus tachycardia (07/04 0807) Resp:  [18-19] 18 (07/04 0423) BP: (128-139)/(54-63) 128/54 mmHg (07/04 0423) SpO2:  [96 %-97 %] 97 % (07/04 0423)  Hemodynamic parameters for last 24 hours:    Intake/Output from previous day: 07/03 0701 - 07/04 0700 In: 1080 [P.O.:1080] Out: -  Intake/Output this shift:    No change in exam  Lab Results:  Recent Labs  01/17/15 0620 01/18/15 0306  WBC 9.6 8.7  HGB 13.3 13.3  HCT 39.5 38.5  PLT 239 267   BMET:  Recent Labs  01/18/15 0306 01/19/15 0326  NA 138 136  K 3.7 4.2  CL 101 102  CO2 25 25  GLUCOSE 153* 151*  BUN 26* 29*  CREATININE 2.17* 2.27*  CALCIUM 8.9 8.3*    PT/INR:  Recent Labs  01/18/15 0306  LABPROT 15.2  INR 1.18   ABG No results found for: PHART, HCO3, TCO2, ACIDBASEDEF, O2SAT CBG (last 3)  No results for input(s): GLUCAP in the last 72 hours.  Assessment/Plan: S/P   We'll check on pleural fluid cytology tomorrow I previously discussed bronchoscopy, Pleurx catheter placement with the patient and her daughter.   LOS: 3 days    Tharon Aquas Trigt III 01/19/2015

## 2015-01-19 NOTE — Progress Notes (Signed)
[facility] PROGRESS NOTE Interim History: 81 year old past medical history of hypertension and chronic renal disease baseline creatinine 1.9-2.1, diabetes mellitus type 2 hyperlipidemia was admitted to the hospital for right pleural effusion and a dry cough. Was found to be in acute renal failure underwent thoracocentesis on 7.2 and pulmonary was consulted as it was clearly exudates CT scan of the chest showed multiple lymphadenopathy CT surgery was consulted that recommended to await cytology of pleural fluid and if unrevealing would recommend bronchoscopy an endobronchial biopsy with placement Pleurx catheter.   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 CT surgery consulted and recommended bronchoscopy with endobronchial biopsy if pleural cytology unrevealing of malignancy.  Acute renal failure/ Chronic kidney disease, stage 3  - Most likely prerenal urine sodium less than 10 hold ACE inhibitor start him on IV fluids recheck a basic metabolic panel in the morning.  Essential hypertension: Continue to hold ACE inhibitor, continue Norvasc. Blood pressure stable   Elevated troponin: Elevated troponins no rising to trend no further cardiac workup.  Code Status: full Family Communication: none  Disposition Plan: inpatient unknown day of d/c   Consultants:  CT  Pulmonary  Procedures:  CT chest  CXR  Antibiotics:  Levaquin  HPI/Subjective: Still SOB, specially with ambulation  Objective: Filed Vitals:   01/18/15 0923 01/18/15 1404 01/18/15 2027 01/19/15 0423  BP: 132/91 130/61 139/63 128/54  Pulse:  105 101 101  Temp:  98.1 F (36.7 C) 98.8 F (37.1 C) 98 F (36.7 C)  TempSrc:  Oral Oral Oral  Resp:  '19 18 18  '$ Height:      Weight:      SpO2:  96% 97% 97%    Intake/Output Summary (Last 24 hours) at  01/19/15 0743 Last data filed at 01/18/15 1828  Gross per 24 hour  Intake   1080 ml  Output      0 ml  Net   1080 ml   Filed Weights   01/16/15 1522 01/16/15 1828  Weight: 83.575 kg (184 lb 4 oz) 82.555 kg (182 lb)    Exam:  General: Alert, awake, oriented x3, in no acute distress.  HEENT: No bruits, no goiter.  Heart: Regular rate and rhythm. Lungs: Good air movement, clear Abdomen: Soft, nontender, nondistended, positive bowel sounds.  Neuro: Grossly intact, nonfocal.   Data Reviewed: Basic Metabolic Panel:  Recent Labs Lab 01/16/15 1532 01/17/15 0620 01/18/15 0306 01/19/15 0326  NA 138 137 138 136  K 3.9 3.5 3.7 4.2  CL 101 100* 101 102  CO2 '26 28 25 25  '$ GLUCOSE 189* 153* 153* 151*  BUN 30* 26* 26* 29*  CREATININE 2.35* 2.08* 2.17* 2.27*  CALCIUM 8.8* 8.8* 8.9 8.3*   Liver Function Tests:  Recent Labs Lab 01/17/15 1100 01/18/15 0306  AST  --  26  ALT  --  22  ALKPHOS  --  62  BILITOT  --  0.5  PROT 7.3 7.4  ALBUMIN  --  3.3*   No results for input(s): LIPASE, AMYLASE in the last 168 hours. No results for input(s): AMMONIA in the last 168 hours. CBC:  Recent Labs Lab 01/16/15 1532 01/17/15 0620 01/18/15 0306  WBC 7.9 9.6 8.7  NEUTROABS 5.2  --  5.3  HGB 12.5 13.3 13.3  HCT 37.0 39.5 38.5  MCV 78.4 79.6 77.6*  PLT 232 239 267   Cardiac Enzymes:  Recent Labs Lab 01/16/15 1532 01/16/15 2001 01/17/15 0022 01/17/15 0620  TROPONINI 0.15* 0.18* 0.16* 0.15*   BNP (last 3 results)  Recent Labs  01/16/15 1532  BNP 32.3    ProBNP (last 3 results) No results for input(s): PROBNP in the last 8760 hours.  CBG: No results for input(s): GLUCAP in the last 168 hours.  Recent Results (from the past 240 hour(s))  Culture, body fluid-bottle     Status: None (Preliminary result)   Collection Time: 01/17/15 10:56 AM  Result Value Ref Range Status   Specimen Description FLUID RIGHT PLEURAL  Final   Special Requests NONE  Final   Gram Stain    Final    FEW WBC PRESENT,BOTH PMN AND MONONUCLEAR NO ORGANISMS SEEN NOT CYTOSPUN    Culture NO GROWTH 1 DAY  Final   Report Status PENDING  Incomplete     Studies: Dg Chest 2 View  01/18/2015   CLINICAL DATA:  Right-sided thoracentesis. Persistent shortness of breath.  EXAM: CHEST  2 VIEW  COMPARISON:  1 day prior  FINDINGS: Midline trachea. Mild cardiomegaly with a mildly tortuous thoracic aorta. Atherosclerosis in the transverse aorta. A moderate right pleural effusion is not significantly changed. No left-sided pleural fluid. No pneumothorax. Right hilar soft tissue fullness is similar. Clear left lung. Right base airspace disease is not significantly changed.  IMPRESSION: No significant change since one day prior.  Moderate right pleural effusion with adjacent airspace disease.  Cardiomegaly with transverse aortic atherosclerosis.  Right hilar soft tissue fullness, as before.   Electronically Signed   By: Abigail Miyamoto M.D.   On: 01/18/2015 09:12   Ct Chest Wo Contrast  01/18/2015   CLINICAL DATA:  Worsening cough and shortness of breath.  Empyema.  EXAM: CT CHEST WITHOUT CONTRAST  TECHNIQUE: Multidetector CT imaging of the chest was performed following the standard protocol without IV contrast.  COMPARISON:  Chest radiograph 01/18/2015  FINDINGS: There is extensive mediastinal lymphadenopathy. Right paratracheal lymph node on sequence 201, image 15 measures 1.6 cm in the short axis. Precarinal lymph node measures 1.9 cm on image 22. Enlarged subcarinal tissue measures 2.6 cm in the short axis on image 29. There is also soft tissue fullness in the right suprahilar region on image 27. Evaluation of the hilar disease is difficult without intravenous contrast. There is right pleural fluid throughout the right chest which appears to be complex or mildly loculated. There is no significant left pleural fluid. No evidence for axillary lymphadenopathy. There is bilateral supraclavicular lymphadenopathy.  Largest left supraclavicular lymph node measures 1.4 cm in the short axis on image 5. Images of the upper abdomen suggest scarring or atrophy in the right kidney. Possible cyst involving left kidney upper pole. No gross abnormality in the visualized liver.  The trachea and mainstem bronchi are are patent. There is volume loss in the right lower lobe related to the pleural fluid. There is also volume loss with air bronchograms in the right middle lobe. There are small nodular densities scattered throughout the right upper lobe. Dominant right upper lobe nodule measures 1.0 cm on sequence 205, image 30. In addition there may be a primary mass in the right upper lobe that roughly measures up to 2.4 cm on sequence 205, image 19. This lesion is along the medial right upper lobe. No significant nodularity or airspace disease in the left lung.  No acute bone abnormality.  IMPRESSION: There are scattered right pulmonary nodules and concern for a primary lesion  along the medial right upper lobe. In addition, there is extensive mediastinal, right hilar and supraclavicular lymphadenopathy. Findings are concerning for a neoplastic process and the lesion along the medial right upper lobe could be the primary lesion. The supraclavicular lymphadenopathy may amendable to percutaneous sampling.  Small to moderate sized loculated right pleural effusion with volume loss in the right lower lobe and right middle lobe.  These results were called by telephone at the time of interpretation on 01/18/2015 at 10:37 am to Dr. Wynelle Cleveland , who verbally acknowledged these results.   Electronically Signed   By: Markus Daft M.D.   On: 01/18/2015 10:41   US Renal  01/18/2015   CLINICAL DATA:  Acute renal failure, history hypertension, chronic kidney disease, diabetes mellitus, hyperlipidemia, former smoker  EXAM: RENAL / URINARY TRACT ULTRASOUND COMPLETE  COMPARISON:  None  FINDINGS: Right Kidney:  Length: 8.6 cm. Normal cortical thickness and  echogenicity. No solid mass, hydronephrosis or shadowing calcification. Tiny hypoechoic nodule at inferior pole 11 x 10 x 9 mm question cyst.  Left Kidney:  Length: 10.6 cm. Normal cortical thickness and echogenicity. Multiple cysts largest no solid mass, hydronephrosis or shadowing calcification. 2.2 x 2.0 x 2.3 cm at upper pole.  Bladder:  Grossly normal appearance for partially distended state.  RIGHT pleural effusion noted.  Insula in the noted increased echogenicity liver likely representing fatty infiltration.  IMPRESSION: Probable small BILATERAL renal cysts.  No evidence of solid renal mass or hydronephrosis.  Small RIGHT pleural effusion.  Probable fatty infiltration of liver.   Electronically Signed   By: Lavonia Dana M.D.   On: 01/18/2015 10:56   Dg Chest 2v Repeat Same Day  01/17/2015   CLINICAL DATA:  Post right thoracentesis. Current history of hypertension, diabetes and chronic kidney disease.  EXAM: CHEST - 2 VIEW  COMPARISON:  01/15/2005 and earlier.  FINDINGS: No evidence of a right pneumothorax after thoracentesis. Residual moderately large right pleural effusion, slightly decreased in size. Dense consolidation in the right middle lobe and right lower lobe, unchanged. No new pulmonary parenchymal abnormalities. Right hilar prominence, unchanged.  IMPRESSION: 1. No evidence of pneumothorax after right thoracentesis. 2. Slight decrease in size of the still moderately large right pleural effusion. 3. Dense consolidation in the right middle lobe and right lower lobe. 4. Right hilar prominence as noted on yesterday's examination. 5. No new abnormalities.   Electronically Signed   By: Evangeline Dakin M.D.   On: 01/17/2015 12:32   US Thoracentesis Asp Pleural Space W/img Guide  01/18/2015   CLINICAL DATA:  Right pleural effusion  EXAM: EXAM THORACENTESIS WITH ULTRASOUND  TECHNIQUE: The procedure, risks (including but not limited to bleeding, infection, organ damage ), benefits, and alternatives were  explained to the patient. Questions regarding the procedure were encouraged and answered. The patient understands and consents to the procedure. Survey ultrasound of the right hemithorax was performed and an appropriate skin entry site was localized. Site was marked, prepped with Betadine, draped in usual sterile fashion, infiltrated locally with 1% lidocaine.  The Saf-T-Centesis needle was advanced into the pleural space. Clear yellow fluid returned. 800 mL was removed. Post procedure imaging shows minimal residual fluid. The patient tolerated procedure well.  COMPLICATIONS: COMPLICATIONS None immediate  IMPRESSION: Technically successful ultrasound-guided right thoracentesis. Follow-up chest radiograph pending.   Electronically Signed   By: Lucrezia Europe M.D.   On: 01/18/2015 09:37    Scheduled Meds: . amLODipine  10 mg Oral Daily  . brimonidine  1 drop Both Eyes TID  . heparin  5,000 Units Subcutaneous 3 times per day  . latanoprost  1 drop Both Eyes QHS  . multivitamin with minerals  1 tablet Oral Daily   Continuous Infusions: . sodium chloride      Time Spent: 25 min   Charlynne Cousins  [facility] Pager [phone]. If 7PM-7AM, please contact night-coverage at [email or web address], password [facility] 01/19/2015, 7:43 AM  LOS: 3 days

## 2015-01-19 NOTE — Care Management (Signed)
Important Message  Patient Details  Name: April Kennedy MRN: [id number] Date of Birth: 1933/11/03   Medicare Important Message Given:  Yes-fourth notification given    Loann Quill 01/19/2015, 9:33 AM

## 2015-01-20 ENCOUNTER — Inpatient Hospital Stay (HOSPITAL_COMMUNITY): Payer: Medicare Other

## 2015-01-20 DIAGNOSIS — C8592 Non-Hodgkin lymphoma, unspecified, intrathoracic lymph nodes: Secondary | ICD-10-CM

## 2015-01-20 LAB — BASIC METABOLIC PANEL
Anion gap: 10 (ref 5–15)
BUN: 26 mg/dL — AB (ref 6–20)
CHLORIDE: 103 mmol/L (ref 101–111)
CO2: 24 mmol/L (ref 22–32)
Calcium: 8 mg/dL — ABNORMAL LOW (ref 8.9–10.3)
Creatinine, Ser: 2.03 mg/dL — ABNORMAL HIGH (ref 0.44–1.00)
GFR calc Af Amer: 26 mL/min — ABNORMAL LOW (ref >60)
GFR, EST NON AFRICAN AMERICAN: 22 mL/min — AB (ref >60)
GLUCOSE: 164 mg/dL — AB (ref 65–99)
POTASSIUM: 3.6 mmol/L (ref 3.5–5.1)
Sodium: 137 mmol/L (ref 135–145)

## 2015-01-20 MED ORDER — SODIUM CHLORIDE 0.9 % IV SOLN: INTRAVENOUS | Status: DC

## 2015-01-20 MED ORDER — HYDROCODONE-ACETAMINOPHEN 5-325 MG PO TABS
1.0000 | ORAL_TABLET | Freq: Four times a day (QID) | ORAL | Status: DC | PRN
  Administered 2015-01-20 – 2015-01-26 (×16): 1 via ORAL
  Filled 2015-01-20 (×16): qty 1

## 2015-01-20 NOTE — Progress Notes (Addendum)
[facility] PROGRESS NOTE Interim History: 81 year old past medical history of hypertension and chronic renal disease baseline creatinine 1.9-2.1, diabetes mellitus type 2 hyperlipidemia was admitted to the hospital for right pleural effusion and a dry cough. Was found to be in acute renal failure underwent thoracocentesis on 7.2 and pulmonary was consulted as it was clearly exudates CT scan of the chest showed multiple lymphadenopathy CT surgery was consulted that recommended to await cytology of pleural fluid and if unrevealing would recommend bronchoscopy an endobronchial biopsy with placement Pleurx catheter.   Assessment/Plan: Acute respiratory failue with hypoxia Pleural effusion, right/  lymph nodes in chest Started empirically on IV Levaquin, underwent thoracocentesis showed to be exudate, cytology pending to rule out malignancy. Appreciate CT surgery assistance. She is off antibiotics, no leukocytosis or fever. Sat > 90% on 2L.  Acute renal failure/ Chronic kidney disease, stage 3  - Most likely prerenal, Cr improved with IV fluids. Unknown baseline.  Essential hypertension: Continue to hold ACE inhibitor, continue Norvasc. Blood pressure stable   Elevated troponin: Elevated troponins no rising to trend no further cardiac workup.  Code Status: full Family Communication: none  Disposition Plan: inpatient unknown day of d/c   Consultants:  CT  Pulmonary  Procedures:  CT chest  CXR  Antibiotics:  Levaquin  HPI/Subjective: Still SOB, specially with ambulation.  Objective: Filed Vitals:   01/19/15 1500 01/19/15 2003 01/19/15 2250 01/20/15 0414  BP: 126/86 137/78  133/59  Pulse: 107 109  103  Temp: 97.9 F (36.6 C) 98 F (36.7 C)  98 F (36.7 C)  TempSrc: Oral Oral  Oral  Resp: '20 21  20  '$ Height:      Weight:      SpO2: 94% 96% 100% 97%    Intake/Output Summary (Last 24 hours) at 01/20/15 0745 Last data filed at 01/19/15 1230  Gross per  24 hour  Intake    480 ml  Output      0 ml  Net    480 ml   Filed Weights   01/16/15 1522 01/16/15 1828  Weight: 83.575 kg (184 lb 4 oz) 82.555 kg (182 lb)    Exam:  General: Alert, awake, oriented x3, in no acute distress.  HEENT: No bruits, no goiter.  Heart: Regular rate and rhythm. Lungs: Good air movement, clear Abdomen: Soft, nontender, nondistended, positive bowel sounds.  Neuro: Grossly intact, nonfocal.   Data Reviewed: Basic Metabolic Panel:  Recent Labs Lab 01/16/15 1532 01/17/15 0620 01/18/15 0306 01/19/15 0326 01/20/15 0345  NA 138 137 138 136 137  K 3.9 3.5 3.7 4.2 3.6  CL 101 100* 101 102 103  CO2 '26 28 25 25 24  '$ GLUCOSE 189* 153* 153* 151* 164*  BUN 30* 26* 26* 29* 26*  CREATININE 2.35* 2.08* 2.17* 2.27* 2.03*  CALCIUM 8.8* 8.8* 8.9 8.3* 8.0*   Liver Function Tests:  Recent Labs Lab 01/17/15 1100 01/18/15 0306  AST  --  26  ALT  --  22  ALKPHOS  --  62  BILITOT  --  0.5  PROT 7.3 7.4  ALBUMIN  --  3.3*   No results for input(s): LIPASE, AMYLASE in the last 168 hours. No results for input(s): AMMONIA in the last 168 hours. CBC:  Recent Labs Lab 01/16/15 1532 01/17/15 0620 01/18/15 0306  WBC 7.9 9.6 8.7  NEUTROABS 5.2  --  5.3  HGB 12.5 13.3 13.3  HCT 37.0 39.5 38.5  MCV 78.4 79.6 77.6*  PLT 232  239 267   Cardiac Enzymes:  Recent Labs Lab 01/16/15 1532 01/16/15 2001 01/17/15 0022 01/17/15 0620  TROPONINI 0.15* 0.18* 0.16* 0.15*   BNP (last 3 results)  Recent Labs  01/16/15 1532  BNP 32.3    ProBNP (last 3 results) No results for input(s): PROBNP in the last 8760 hours.  CBG: No results for input(s): GLUCAP in the last 168 hours.  Recent Results (from the past 240 hour(s))  Culture, body fluid-bottle     Status: None (Preliminary result)   Collection Time: 01/17/15 10:56 AM  Result Value Ref Range Status   Specimen Description FLUID RIGHT PLEURAL  Final   Special Requests NONE  Final   Gram Stain   Final      FEW WBC PRESENT,BOTH PMN AND MONONUCLEAR NO ORGANISMS SEEN NOT CYTOSPUN    Culture NO GROWTH 2 DAYS  Final   Report Status PENDING  Incomplete     Studies: Ct Chest Wo Contrast  01/18/2015   CLINICAL DATA:  Worsening cough and shortness of breath.  Empyema.  EXAM: CT CHEST WITHOUT CONTRAST  TECHNIQUE: Multidetector CT imaging of the chest was performed following the standard protocol without IV contrast.  COMPARISON:  Chest radiograph 01/18/2015  FINDINGS: There is extensive mediastinal lymphadenopathy. Right paratracheal lymph node on sequence 201, image 15 measures 1.6 cm in the short axis. Precarinal lymph node measures 1.9 cm on image 22. Enlarged subcarinal tissue measures 2.6 cm in the short axis on image 29. There is also soft tissue fullness in the right suprahilar region on image 27. Evaluation of the hilar disease is difficult without intravenous contrast. There is right pleural fluid throughout the right chest which appears to be complex or mildly loculated. There is no significant left pleural fluid. No evidence for axillary lymphadenopathy. There is bilateral supraclavicular lymphadenopathy. Largest left supraclavicular lymph node measures 1.4 cm in the short axis on image 5. Images of the upper abdomen suggest scarring or atrophy in the right kidney. Possible cyst involving left kidney upper pole. No gross abnormality in the visualized liver.  The trachea and mainstem bronchi are are patent. There is volume loss in the right lower lobe related to the pleural fluid. There is also volume loss with air bronchograms in the right middle lobe. There are small nodular densities scattered throughout the right upper lobe. Dominant right upper lobe nodule measures 1.0 cm on sequence 205, image 30. In addition there may be a primary mass in the right upper lobe that roughly measures up to 2.4 cm on sequence 205, image 19. This lesion is along the medial right upper lobe. No significant nodularity or  airspace disease in the left lung.  No acute bone abnormality.  IMPRESSION: There are scattered right pulmonary nodules and concern for a primary lesion along the medial right upper lobe. In addition, there is extensive mediastinal, right hilar and supraclavicular lymphadenopathy. Findings are concerning for a neoplastic process and the lesion along the medial right upper lobe could be the primary lesion. The supraclavicular lymphadenopathy may amendable to percutaneous sampling.  Small to moderate sized loculated right pleural effusion with volume loss in the right lower lobe and right middle lobe.  These results were called by telephone at the time of interpretation on 01/18/2015 at 10:37 am to Dr. Wynelle Cleveland , who verbally acknowledged these results.   Electronically Signed   By: Markus Daft M.D.   On: 01/18/2015 10:41   US Renal  01/18/2015   CLINICAL DATA:  Acute  renal failure, history hypertension, chronic kidney disease, diabetes mellitus, hyperlipidemia, former smoker  EXAM: RENAL / URINARY TRACT ULTRASOUND COMPLETE  COMPARISON:  None  FINDINGS: Right Kidney:  Length: 8.6 cm. Normal cortical thickness and echogenicity. No solid mass, hydronephrosis or shadowing calcification. Tiny hypoechoic nodule at inferior pole 11 x 10 x 9 mm question cyst.  Left Kidney:  Length: 10.6 cm. Normal cortical thickness and echogenicity. Multiple cysts largest no solid mass, hydronephrosis or shadowing calcification. 2.2 x 2.0 x 2.3 cm at upper pole.  Bladder:  Grossly normal appearance for partially distended state.  RIGHT pleural effusion noted.  Insula in the noted increased echogenicity liver likely representing fatty infiltration.  IMPRESSION: Probable small BILATERAL renal cysts.  No evidence of solid renal mass or hydronephrosis.  Small RIGHT pleural effusion.  Probable fatty infiltration of liver.   Electronically Signed   By: Lavonia Dana M.D.   On: 01/18/2015 10:56    Scheduled Meds: . amLODipine  10 mg Oral Daily  .  brimonidine  1 drop Both Eyes TID  . heparin  5,000 Units Subcutaneous 3 times per day  . latanoprost  1 drop Both Eyes QHS  . multivitamin with minerals  1 tablet Oral Daily   Continuous Infusions: . sodium chloride 100 mL/hr (01/20/15 5573)    Time Spent: 25 min   Charlynne Cousins  [facility] Pager [phone]. If 7PM-7AM, please contact night-coverage at [email or web address], password [facility] 01/20/2015, 7:45 AM  LOS: 4 days

## 2015-01-21 ENCOUNTER — Inpatient Hospital Stay (HOSPITAL_COMMUNITY): Payer: Medicare Other

## 2015-01-21 DIAGNOSIS — J45901 Unspecified asthma with (acute) exacerbation: Secondary | ICD-10-CM

## 2015-01-21 LAB — BASIC METABOLIC PANEL
Anion gap: 14 (ref 5–15)
BUN: 22 mg/dL — AB (ref 6–20)
CHLORIDE: 103 mmol/L (ref 101–111)
CO2: 20 mmol/L — ABNORMAL LOW (ref 22–32)
Calcium: 8.2 mg/dL — ABNORMAL LOW (ref 8.9–10.3)
Creatinine, Ser: 1.94 mg/dL — ABNORMAL HIGH (ref 0.44–1.00)
GFR calc non Af Amer: 24 mL/min — ABNORMAL LOW (ref >60)
GFR, EST AFRICAN AMERICAN: 27 mL/min — AB (ref >60)
Glucose, Bld: 183 mg/dL — ABNORMAL HIGH (ref 65–99)
Potassium: 3.9 mmol/L (ref 3.5–5.1)
SODIUM: 137 mmol/L (ref 135–145)

## 2015-01-21 LAB — BLOOD GAS, ARTERIAL
Acid-base deficit: 2 mmol/L (ref 0.0–2.0)
Bicarbonate: 22.1 mEq/L (ref 20.0–24.0)
DRAWN BY: 312761
O2 CONTENT: 2 L/min
O2 Saturation: 92.4 %
PCO2 ART: 36.3 mmHg (ref 35.0–45.0)
PH ART: 7.402 (ref 7.350–7.450)
Patient temperature: 98.6
TCO2: 23.2 mmol/L (ref 0–100)
pO2, Arterial: 67.2 mmHg — ABNORMAL LOW (ref 80.0–100.0)

## 2015-01-21 LAB — LACTIC ACID, PLASMA: Lactic Acid, Venous: 1.2 mmol/L (ref 0.5–2.0)

## 2015-01-21 LAB — GRAM STAIN

## 2015-01-21 LAB — MRSA PCR SCREENING: MRSA by PCR: NEGATIVE

## 2015-01-21 MED ORDER — LEVALBUTEROL HCL 0.63 MG/3ML IN NEBU: 0.6300 mg | INHALATION_SOLUTION | Freq: Four times a day (QID) | RESPIRATORY_TRACT | Status: DC | PRN

## 2015-01-21 MED ORDER — METHYLPREDNISOLONE SODIUM SUCC 125 MG IJ SOLR: 60.0000 mg | Freq: Three times a day (TID) | INTRAMUSCULAR | Status: DC

## 2015-01-21 MED ORDER — METHYLPREDNISOLONE SODIUM SUCC 40 MG IJ SOLR
40.0000 mg | Freq: Two times a day (BID) | INTRAMUSCULAR | Status: DC
  Administered 2015-01-21 (×2): 40 mg via INTRAVENOUS
  Filled 2015-01-21 (×8): qty 1

## 2015-01-21 MED ORDER — MORPHINE SULFATE 2 MG/ML IJ SOLN: 1.0000 mg | INTRAMUSCULAR | Status: DC | PRN

## 2015-01-21 MED ORDER — LIDOCAINE HCL (PF) 1 % IJ SOLN
INTRAMUSCULAR | Status: AC
  Filled 2015-01-21: qty 10

## 2015-01-21 MED ORDER — MORPHINE SULFATE 2 MG/ML IJ SOLN
0.5000 mg | Freq: Once | INTRAMUSCULAR | Status: AC
  Administered 2015-01-21: 0.5 mg via INTRAVENOUS
  Filled 2015-01-21: qty 1

## 2015-01-21 MED ORDER — METHYLPREDNISOLONE SODIUM SUCC 125 MG IJ SOLR
60.0000 mg | Freq: Once | INTRAMUSCULAR | Status: AC
  Administered 2015-01-21: 60 mg via INTRAVENOUS
  Filled 2015-01-21: qty 0.96

## 2015-01-21 MED ORDER — LEVALBUTEROL HCL 0.63 MG/3ML IN NEBU
0.6300 mg | INHALATION_SOLUTION | RESPIRATORY_TRACT | Status: AC
  Administered 2015-01-21: 0.63 mg via RESPIRATORY_TRACT

## 2015-01-21 MED ORDER — LEVALBUTEROL HCL 0.63 MG/3ML IN NEBU
0.6300 mg | INHALATION_SOLUTION | Freq: Four times a day (QID) | RESPIRATORY_TRACT | Status: DC
  Administered 2015-01-21 – 2015-01-22 (×3): 0.63 mg via RESPIRATORY_TRACT
  Filled 2015-01-21 (×12): qty 3

## 2015-01-21 MED ORDER — MORPHINE SULFATE 2 MG/ML IJ SOLN: 0.5000 mg | INTRAMUSCULAR | Status: DC | PRN

## 2015-01-21 NOTE — Progress Notes (Signed)
RT called to room due to increased WOB and wheezing. Breathing txs x2 given with slight improvement in air movement. Pt states she feels a "tad" better. Rapid Response RN called and MD paged.

## 2015-01-21 NOTE — Progress Notes (Signed)
PULMONARY / CRITICAL CARE MEDICINE   Name: April Kennedy MRN: [id number] DOB: 30-Aug-1933    ADMISSION DATE:  01/16/2015 CONSULTATION DATE:  01/17/15  REFERRING MD :  [facility]   CHIEF COMPLAINT:  Dyspnea  INITIAL PRESENTATION:  81 yo female former smoker with progressive cough, dyspnea.  Found to have exudate Rt pleural effusion.     SIGNIFICANT EVENTS 7/01 Admit 7/02 IR thoracentesis 7/03 TCTS consulted 7/06 To ICU for respiratory distress  STUDIES: 7/02 Rt thoracentesis >> 800 ml, glucose 112, LDH 1546, protein 5, WBC 1666 (61%N) 7/02 Echo >> EF 55 to 50%, grade 1 diastolic dysfx, PAS 36 mmHg 7/03 Renal u/s >> b/l renal cysts 7/03 CT chest >> mediastinal LAN, Rt suprahilar fullness, Rt pleural fluid  SUBJECTIVE:  Still feels short of breath.  Anxious to find out about test results.   VITAL SIGNS: Temp:  [98 F (36.7 C)-98.5 F (36.9 C)] 98.5 F (36.9 C) (07/06 0708) Pulse Rate:  [98-116] 99 (07/06 0700) Resp:  [13-22] 15 (07/06 0700) BP: (141-163)/(62-100) 151/78 mmHg (07/06 0700) SpO2:  [96 %-98 %] 97 % (07/06 0700) INTAKE / OUTPUT:  Intake/Output Summary (Last 24 hours) at 01/21/15 0722 Last data filed at 01/21/15 0700  Gross per 24 hour  Intake    720 ml  Output    150 ml  Net    570 ml    PHYSICAL EXAMINATION: General: mild increase WOB Neuro: Alert, normal strength HEENT: no sinus tenderness Lungs: decreased BS rt base, no wheeze Cards: regular Abd: soft, non-tender MSK: no edema Skin: no rashes  LABS:  CBC  Recent Labs Lab 01/16/15 1532 01/17/15 0620 01/18/15 0306  WBC 7.9 9.6 8.7  HGB 12.5 13.3 13.3  HCT 37.0 39.5 38.5  PLT 232 239 267   Coag's  Recent Labs Lab 01/18/15 0306  INR 1.18   BMET  Recent Labs Lab 01/18/15 0306 01/19/15 0326 01/20/15 0345  NA 138 136 137  K 3.7 4.2 3.6  CL 101 102 103  CO2 '25 25 24  '$ BUN 26* 29* 26*  CREATININE 2.17* 2.27* 2.03*  GLUCOSE 153* 151* 164*   Electrolytes  Recent Labs Lab  01/18/15 0306 01/19/15 0326 01/20/15 0345  CALCIUM 8.9 8.3* 8.0*   Sepsis Markers  Recent Labs Lab 01/21/15 0405  LATICACIDVEN 1.2   ABG  Recent Labs Lab 01/21/15 0250  PHART 7.402  PCO2ART 36.3  PO2ART 67.2*   Liver Enzymes  Recent Labs Lab 01/18/15 0306  AST 26  ALT 22  ALKPHOS 62  BILITOT 0.5  ALBUMIN 3.3*   Cardiac Enzymes  Recent Labs Lab 01/16/15 2001 01/17/15 0022 01/17/15 0620  TROPONINI 0.18* 0.16* 0.15*   Glucose No results for input(s): GLUCAP in the last 168 hours.  Imaging Dg Chest 2 View  01/20/2015   CLINICAL DATA:  Acute respiratory failure with hypoxemia. Right pleural effusion. Lymphoma.  EXAM: CHEST  2 VIEW  COMPARISON:  01/18/2015  FINDINGS: Moderate right pleural effusion shows mild increase in size since previous study. There is increased right basilar compressive atelectasis.  Left lung remains clear. Heart size is stable. Right paratracheal soft tissue fullness again seen, consistent with lymphadenopathy as shown on recent chest CT.  IMPRESSION: Increased size of moderate right pleural effusion and right basilar atelectasis.  Persistent right paratracheal soft tissue fullness, consistent with mediastinal lymphadenopathy.   Electronically Signed   By: Earle Gell M.D.   On: 01/20/2015 08:29   Dg Chest Port 1 View  01/21/2015  CLINICAL DATA:  Shortness of breath  EXAM: PORTABLE CHEST - 1 VIEW  COMPARISON:  01/20/2015  FINDINGS: Stable moderate right pleural effusion with lower lobe atelectasis based on recent chest CT. Mediastinal and right hilar lymphadenopathy is better seen on previous CT imaging. The left lung remains clear. No cardiomegaly.  IMPRESSION: 1. Stable moderate right pleural effusion with lower lobe collapse/consolidation. 2. Suspected intrathoracic malignancy by chest CT 01/18/2015.   Electronically Signed   By: Monte Fantasia M.D.   On: 01/21/2015 02:19     ASSESSMENT / PLAN:  Acute hypoxic respiratory failure 2nd to  asthma exacerbation. Plan: - oxygen to keep SpO2 > 92% - continue xopenex - BiPAP prn - continue solumedrol  Rt hilar fullness, mediastinal LAN, and exudate Rt pleural effusion >> concerning for malignancy. Plan: - f/u pleural fluid cytology - if cytology negative, then TCTS to set up [place], MD [place] 01/21/2015, 7:33 AM Pager:  [phone] After 3pm call: [phone]

## 2015-01-21 NOTE — Progress Notes (Signed)
PULMONARY / CRITICAL CARE MEDICINE   Name: April Kennedy MRN: [id number] DOB: 1933/09/12    ADMISSION DATE:  01/16/2015 CONSULTATION DATE:  01/17/15  REFERRING MD :  [facility]   CHIEF COMPLAINT:  Dyspnea  INITIAL PRESENTATION:  52 yobf quit smoking 2014 with no apparent sequelae/ restrictions then "really bad cold" late May 2016 never really got over it then much worse cough/sob x 1 week pta with mostly white mucus production but unable to lie flat due to sob so admitted 7/1 with new R effusion noted and pccm service asked to eval am 7/2. 7/6 transferred to ICU from floor for respiratory distress. PCCM re-consulted.      SIGNIFICANT EVENTS R  thoracentesis 7/2 :  800 cc Exudative  With very high LDH and nl glucose / wbc 1666  P> L, no org seen with post procedure cxr with persistent moderate effusion     SUBJECTIVE: Patient has had some issues with SOB over the past 2 nights. Last night this was easily resolved with nebulaized bronchodilators. Tonight, 7/6, she developed acute onset dyspnea which was refractory to 2 xopenex nebulizers. TRH evaluated the patient and noted slight wheeze, but definite increased WOB. Solumedrol and lasix were both administered, and patient was moved to ICU for closer monitoring. PCCM asked to re-evaluate. Upon my evaluation she reports SOB which is markedly improved at this point. Episode seemed to have been incited by a coughing episode. Cough was described as dry and hacking, and "originating from abdomen" denies abdominal pain. Has feelings of cold/heat which she thinks might represent fever.     VITAL SIGNS: Temp:  [98 F (36.7 C)-98.1 F (36.7 C)] 98.1 F (36.7 C) (07/05 2119) Pulse Rate:  [102-116] 102 (07/05 2119) Resp:  [20-22] 20 (07/05 2119) BP: (133-163)/(59-72) 154/72 mmHg (07/05 2119) SpO2:  [96 %-98 %] 98 % (07/06 0200) HEMODYNAMICS:   VENTILATOR SETTINGS:   INTAKE / OUTPUT:  Intake/Output Summary (Last 24 hours) at 01/21/15 0334 Last data  filed at 01/20/15 1300  Gross per 24 hour  Intake    720 ml  Output      0 ml  Net    720 ml    PHYSICAL EXAMINATION: General: Elderly female in very minimal respiratory distress. Breathing comfortably.  Able to speak full sentences.  Neuro: Alert, oriented, non-focal HEENT: [place]/AT, PERRL, no JVD noted Lungs: Diminished, particularly R base. Mild expiratory wheeze. No accessory muscle use Cards: Tachy, regular, no MRG Abd: soft, non-tender, non-distended MSK: Trace BLE edema, no acute deformity Skin: diaphoresis, improving, grossly intact  LABS:  CBC  Recent Labs Lab 01/16/15 1532 01/17/15 0620 01/18/15 0306  WBC 7.9 9.6 8.7  HGB 12.5 13.3 13.3  HCT 37.0 39.5 38.5  PLT 232 239 267   Coag's  Recent Labs Lab 01/18/15 0306  INR 1.18   BMET  Recent Labs Lab 01/18/15 0306 01/19/15 0326 01/20/15 0345  NA 138 136 137  K 3.7 4.2 3.6  CL 101 102 103  CO2 '25 25 24  '$ BUN 26* 29* 26*  CREATININE 2.17* 2.27* 2.03*  GLUCOSE 153* 151* 164*   Electrolytes  Recent Labs Lab 01/18/15 0306 01/19/15 0326 01/20/15 0345  CALCIUM 8.9 8.3* 8.0*   Sepsis Markers No results for input(s): LATICACIDVEN, PROCALCITON, O2SATVEN in the last 168 hours. ABG  Recent Labs Lab 01/21/15 0250  PHART 7.402  PCO2ART 36.3  PO2ART 67.2*   Liver Enzymes  Recent Labs Lab 01/18/15 0306  AST 26  ALT 22  ALKPHOS 62  BILITOT 0.5  ALBUMIN 3.3*   Cardiac Enzymes  Recent Labs Lab 01/16/15 2001 01/17/15 0022 01/17/15 0620  TROPONINI 0.18* 0.16* 0.15*   Glucose No results for input(s): GLUCAP in the last 168 hours.  Imaging Dg Chest 2 View  01/20/2015   CLINICAL DATA:  Acute respiratory failure with hypoxemia. Right pleural effusion. Lymphoma.  EXAM: CHEST  2 VIEW  COMPARISON:  01/18/2015  FINDINGS: Moderate right pleural effusion shows mild increase in size since previous study. There is increased right basilar compressive atelectasis.  Left lung remains clear. Heart size is  stable. Right paratracheal soft tissue fullness again seen, consistent with lymphadenopathy as shown on recent chest CT.  IMPRESSION: Increased size of moderate right pleural effusion and right basilar atelectasis.  Persistent right paratracheal soft tissue fullness, consistent with mediastinal lymphadenopathy.   Electronically Signed   By: Earle Gell M.D.   On: 01/20/2015 08:29   Dg Chest Port 1 View  01/21/2015   CLINICAL DATA:  Shortness of breath  EXAM: PORTABLE CHEST - 1 VIEW  COMPARISON:  01/20/2015  FINDINGS: Stable moderate right pleural effusion with lower lobe atelectasis based on recent chest CT. Mediastinal and right hilar lymphadenopathy is better seen on previous CT imaging. The left lung remains clear. No cardiomegaly.  IMPRESSION: 1. Stable moderate right pleural effusion with lower lobe collapse/consolidation. 2. Suspected intrathoracic malignancy by chest CT 01/18/2015.   Electronically Signed   By: Monte Fantasia M.D.   On: 01/21/2015 02:19     ASSESSMENT / PLAN:  Acute hypoxemic respiratory failure >> improving etiology uncertain at this point. ddx includes pulmonary edema, worsening effusion, acute exacerbation asthma Hx Asthma  Former smoker but no obvious sequelae  - improved with lasix, solumedrol - supplemental O2 - start I&Os - Consider BiPAP if worsens - PRN bronchodilators - Would hold off on scheduled steroids - Follow CXR - am labs pending  Classic late parapneumic effusion on R clearly loculated  - tapped 7/2 > not empyema but still  strongly suspect she'll need vats and appears to be a good candidate - TCTS following, will re-evaluate once pleural cytology results - Possibly for pleurex  Georgann Housekeeper, AGACNP-BC [facility] Pulmonology/Critical Care Pager [phone] or [phone]  01/21/2015 3:59 AM

## 2015-01-21 NOTE — Progress Notes (Signed)
Shift event: Brief HPI: Pt admitted on 01/16/15 with one week of SOB found to have pleural effusion. Had thoracentesis with 800cc drained on 01/17/15. Suspicion for malignancy based on CT chest. Still awaiting cytology. TCTS is following daily. Bronchoscopy, VATS, and pleurx catheter have been discussed with pt and daughter by [employer].  Tonight, RN paged this NP stating pt had become acutely more SOB with wheezing and increased WOB. RT at bedside suggesting additional neb and steroids. Xopenex x 2, CXR, and Solumedrol '60mg'$  IV ordered and NP to bedside. S: pt endorses SOB and wheezing. Says event was triggered by a coughing spell. Says she is a tad better since the breathing treatments. Per RN, pt was OOB when event occurred and O2 line became disconnected for a second or two. Never desaturated, just appeared labored and struggling. O2 at 2L and increased to 3L during event. Pt's daughter said that Nils Pyle had mentioned that the "fluid looked like it was coming back". Also, daughter thinks her mother looks as bad tonight as she did when she was admitted.  O: Upon arrival to the pt's room, she is sitting in a chair and appears quite toxic at present. Alert and oriented. She is audibly wheezing and using accessory muscles to breathe. O2 sat 94% on 3L. Resp effort 25 and labored. Unable to speak in full sentences. Lungs are diminished but air exchange is fair to good. Some wheezing. No rhonchi or crackles noted. RRR. Non focal neuro. A/P: 1. Acute respiratory distress. Moved to SDU urgently in anticipation of Bipap. Improved somewhat after transfer with xopenex, morphine, and solumedrol. CXR done shows similar effusion in RLL. ABG normal except for 68 PO2.  2. Pleural effusion, thought to be malignant. TCTS following. VATS, pleurx and bronchoscopy have been discussed. Awaiting cytology from thoracentesis. Discussed calling TCTS with Dr. Arnoldo Morale of [facility] who agreed it wasn't necessary at this point. Will make  sure TCTS is aware of transfer and overnight events at 0700. Will get PCCM consult. PCCM called and came to consult. Discussed case with P. Heber Sheffield, NP at bedside. May need bipap if pt declines. Daughter updated about plan.  Clance Boll, NP  [facility]

## 2015-01-21 NOTE — Progress Notes (Signed)
RN called to bedside due to pt having increased SOB. Upon assessment pt DOE; very SOB and wheezing. Oxygen saturation at this time 94-97%. Pt placed on 2L N/C. RT called to bedside to further assess pt and to administer breathing treatment. RT called Rapid Response RN to receive additional order. RRN placed order for STAT CXR; RRN at bedside to assess pt. RN paged [place],NP. Baltazar Najjar informed of pt current condition and asked to come see pt; new orders placed at this time for additional breathing treatment and Solu-Medrol '60mg'$  IV. Baltazar Najjar, NP at bedside to assess pt. STAT CXR performed. Results reviewed by Baltazar Najjar, NP and new orders placed to transfer pt. Report called to Naida Sleight, RN on 2S. Pt breathing status imporved, but still wheezing and using accessory muscles. Pt transported to 2S15 with bedside RN and RRN. Personal belongings sent with pt. Pt's daughter updated on plan of care.

## 2015-01-21 NOTE — Care Management Note (Signed)
Case Management Note  Patient Details  Name: April Kennedy MRN: [id number] Date of Birth: [birth year]  Subjective/Objective:          Lives at home with son, has daughters around also.  Colletta [place], one of her daughters in room now.  States if patient needs assistance on discharge there are enough family members to care for her.  Patient independent prior to admission. Drives self, no DME at home.  PCP - Dr. Kevan Ny.           Action/Plan:   Expected Discharge Date:                  Expected Discharge Plan:     In-House Referral:     Discharge planning Services     Post Acute Care Choice:    Choice offered to:     DME Arranged:    DME Agency:     HH Arranged:    [place] Agency:     Status of Service:     Medicare Important Message Given:  Yes-fourth notification given Date Medicare IM Given:    Medicare IM give by:    Date Additional Medicare IM Given:    Additional Medicare Important Message give by:     If discussed at [place] of Stay Meetings, dates discussed:    Additional Comments:  Vergie Living, RN 01/21/2015, 11:26 AM

## 2015-01-21 NOTE — Progress Notes (Signed)
Called per RT at 0150 regarding Pt with worsening SOB with severe wheezing, unimproved with one Xopenex Neb treatment. RT advised to give second NEB treatment while awaiting [facility] NP paged to bedside. RRT RN unable to come right away due to prior RRT call. CXR ordered per protocol for Pt in respiratory distress while RRT en route. Once at bedside 0215 Pt found resting in chair, awake alert oriented. Pt appears uncomfortable sitting in chair, RR 20-25, some accessory muscle use seen. Po2 94-96 on 2 LNC. Lung sound diminished in right base worse than left, good air movement in upper lobes and expiratory wheeze heard in upper airway. Per RT lung sound improved after second NEB treatment. IVP Methylprednisolone 60 mg ordered per [facility] NP and given at [place] NP at bedside to assess 0220.  Pt. Morphine 0.'5mg'$  ordered and given at 0242 for pain and increased WOB. Pt with some improvement shortly after morphine, WOB decreased. PCCM called per NP. Pt assisted back to bed, increased WOB and wheeze with exertion. Once back in bed Po2 remained 94% and above. Pt transferred to 2S15 st 0315, order for SDU however admitted to ICU as a SD overflow Pt. Pt settled in bed per [place] staff, hand off provided to [employer].

## 2015-01-21 NOTE — Procedures (Signed)
Successful US guided right thoracentesis. Yielded 1100 mls of bloody fluid. Pt tolerated procedure well. No immediate complications.  Specimen was sent for labs. CXR ordered.  Dustyn Armbrister S Isabele Lollar PA-C 01/21/2015 2:36 PM

## 2015-01-22 ENCOUNTER — Inpatient Hospital Stay (HOSPITAL_COMMUNITY): Payer: Medicare Other

## 2015-01-22 DIAGNOSIS — C801 Malignant (primary) neoplasm, unspecified: Principal | ICD-10-CM

## 2015-01-22 DIAGNOSIS — J90 Pleural effusion, not elsewhere classified: Secondary | ICD-10-CM

## 2015-01-22 DIAGNOSIS — N19 Unspecified kidney failure: Secondary | ICD-10-CM

## 2015-01-22 DIAGNOSIS — J4541 Moderate persistent asthma with (acute) exacerbation: Secondary | ICD-10-CM

## 2015-01-22 LAB — CULTURE, BODY FLUID-BOTTLE: CULTURE: NO GROWTH

## 2015-01-22 LAB — BASIC METABOLIC PANEL
ANION GAP: 10 (ref 5–15)
BUN: 26 mg/dL — ABNORMAL HIGH (ref 6–20)
CALCIUM: 8.4 mg/dL — AB (ref 8.9–10.3)
CO2: 22 mmol/L (ref 22–32)
Chloride: 105 mmol/L (ref 101–111)
Creatinine, Ser: 1.91 mg/dL — ABNORMAL HIGH (ref 0.44–1.00)
GFR calc Af Amer: 28 mL/min — ABNORMAL LOW (ref >60)
GFR, EST NON AFRICAN AMERICAN: 24 mL/min — AB (ref >60)
Glucose, Bld: 182 mg/dL — ABNORMAL HIGH (ref 65–99)
Potassium: 4.5 mmol/L (ref 3.5–5.1)
SODIUM: 137 mmol/L (ref 135–145)

## 2015-01-22 LAB — CULTURE, BODY FLUID W GRAM STAIN -BOTTLE

## 2015-01-22 LAB — CBC
HCT: 36.4 % (ref 36.0–46.0)
HEMOGLOBIN: 12.4 g/dL (ref 12.0–15.0)
MCH: 26.9 pg (ref 26.0–34.0)
MCHC: 34.1 g/dL (ref 30.0–36.0)
MCV: 79 fL (ref 78.0–100.0)
Platelets: ADEQUATE 10*3/uL (ref 150–400)
RBC: 4.61 MIL/uL (ref 3.87–5.11)
RDW: 13.4 % (ref 11.5–15.5)
WBC: 12.5 10*3/uL — ABNORMAL HIGH (ref 4.0–10.5)

## 2015-01-22 MED ORDER — LEVALBUTEROL HCL 0.63 MG/3ML IN NEBU: 0.6300 mg | INHALATION_SOLUTION | Freq: Four times a day (QID) | RESPIRATORY_TRACT | Status: DC | PRN

## 2015-01-22 MED ORDER — PREDNISONE 20 MG PO TABS
30.0000 mg | ORAL_TABLET | Freq: Every day | ORAL | Status: DC
  Administered 2015-01-22 – 2015-01-27 (×6): 30 mg via ORAL
  Filled 2015-01-22 (×12): qty 1

## 2015-01-22 NOTE — Consult Note (Addendum)
[facility] CONSULT NOTE  Patient Care Team: Rogers Blocker, MD as PCP - General (Internal Medicine)  CHIEF COMPLAINTS/PURPOSE OF CONSULTATION:  Newly diagnosed lung adenocarcinoma  HISTORY OF PRESENTING ILLNESS:  April Kennedy 81 y.o. female is admitted to the hospital when a chest x-ray revealed pleural effusion. She was having shortness of breath for one week. The x-ray showed right-sided pleural effusion. She also had elevated serum creatinine of 2.35 on admission. She is a prior smoker quit in 2014 and had some kind of symptoms of cough with expectoration periodically for a while. She underwent thoracentesis on 01/17/2015 and the final pathology came back as adenocarcinoma. CT of the chest revealed multiple lung nodules as well as extensive mediastinal lymphadenopathy right paratracheal 1.6 cm, precarinal 1.9 cm subcarinal 2.6 cm right suprahilar fullness right pleural effusion, bilateral supraclavicular lymphadenopathy left 1.4 cm, small nodules throughout right lung dominant right upper lobe nodule 1 cm and 2.4 cm.  I reviewed her records extensively and collaborated the history with the patient.  MEDICAL HISTORY:  Past Medical History  Diagnosis Date  . Hypertension   . Glaucoma   . Chronic kidney disease   . Neuromuscular disorder   . Diabetes mellitus without complication   . Asthma   . Hyperlipidemia     SURGICAL HISTORY: History reviewed. No pertinent past surgical history.  SOCIAL HISTORY: History   Social History  . Marital Status: Widowed    Spouse Name: N/A  . Number of Children: N/A  . Years of Education: N/A   Occupational History  . Not on file.   Social History Main Topics  . Smoking status: Former Research scientist (life sciences)  . Smokeless tobacco: Not on file  . Alcohol Use: No  . Drug Use: Not on file  . Sexual Activity: Not on file   Other Topics Concern  . Not on file   Social History Narrative    FAMILY HISTORY: Family History  Problem Relation  Age of Onset  . Hypertension      ALLERGIES:  is allergic to sulfa antibiotics.  MEDICATIONS:  Current Facility-Administered Medications  Medication Dose Route Frequency Provider Last Rate Last Dose  . acetaminophen (TYLENOL) tablet 650 mg  650 mg Oral Q6H PRN Geradine Girt, DO   650 mg at 01/20/15 1354  . amLODipine (NORVASC) tablet 10 mg  10 mg Oral Daily Geradine Girt, DO   10 mg at 01/22/15 1053  . brimonidine (ALPHAGAN) 0.2 % ophthalmic solution 1 drop  1 drop Both Eyes TID Geradine Girt, DO   1 drop at 01/22/15 1051  . heparin injection 5,000 Units  5,000 Units Subcutaneous 3 times per day Geradine Girt, DO   5,000 Units at 01/22/15 0545  . hydrALAZINE (APRESOLINE) injection 10 mg  10 mg Intravenous Q6H PRN Geradine Girt, DO      . HYDROcodone-acetaminophen (NORCO/VICODIN) 5-325 MG per tablet 1 tablet  1 tablet Oral Q6H PRN Gardiner Barefoot, NP   1 tablet at 01/21/15 1825  . latanoprost (XALATAN) 0.005 % ophthalmic solution 1 drop  1 drop Both Eyes QHS Geradine Girt, DO   1 drop at 01/21/15 2255  . levalbuterol (XOPENEX) nebulizer solution 0.63 mg  0.63 mg Nebulization Q6H Gardiner Barefoot, NP   0.63 mg at 01/22/15 0224  . morphine 2 MG/ML injection 1 mg  1 mg Intravenous Q3H PRN Gardiner Barefoot, NP      . multivitamin with minerals tablet 1 tablet  1 tablet Oral Daily Geradine Girt, DO   1 tablet at 01/22/15 1053  . predniSONE (DELTASONE) tablet 30 mg  30 mg Oral Q breakfast Juanito Doom, MD   30 mg at 01/22/15 1052  . zolpidem (AMBIEN) tablet 5 mg  5 mg Oral QHS PRN Gardiner Barefoot, NP   5 mg at 01/20/15 2345    REVIEW OF SYSTEMS:   Constitutional: Denies fevers, chills or abnormal night sweats Eyes: Denies blurriness of vision, double vision or watery eyes Ears, nose, mouth, throat, and face: Denies mucositis or sore throat Respiratory: Cough expectoration and shortness of breath Cardiovascular: Denies palpitation, chest discomfort or lower extremity  swelling Gastrointestinal:  Denies nausea, heartburn or change in bowel habits Skin: Denies abnormal skin rashes Lymphatics: Denies new lymphadenopathy or easy bruising Neurological:Denies numbness, tingling or new weaknesses Behavioral/Psych: Mood is stable, no new changes  All other systems were reviewed with the patient and are negative.  PHYSICAL EXAMINATION: ECOG PERFORMANCE STATUS: 3 - Symptomatic, >50% confined to bed  Filed Vitals:   01/22/15 1100  BP:   Pulse:   Temp: 98.3 F (36.8 C)  Resp:    Filed Weights   01/16/15 1522 01/16/15 1828  Weight: 184 lb 4 oz (83.575 kg) 182 lb (82.555 kg)    GENERAL:alert, no distress and comfortable SKIN: skin color, texture, turgor are normal, no rashes or significant lesions EYES: normal, conjunctiva are pink and non-injected, sclera clear OROPHARYNX:no exudate, no erythema and lips, buccal mucosa, and tongue normal  NECK: supple, thyroid normal size, non-tender, without nodularity LYMPH:  no palpable lymphadenopathy in the cervical, axillary or inguinal LUNGS: Diminished breath sounds right lung base HEART: regular rate & rhythm and no murmurs and no lower extremity edema ABDOMEN:abdomen soft, non-tender and normal bowel sounds Musculoskeletal:no cyanosis of digits and no clubbing  PSYCH: alert & oriented x 3 with fluent speech NEURO: no focal motor/sensory deficits  LABORATORY DATA:  I have reviewed the data as listed Lab Results  Component Value Date   WBC 12.5* 01/22/2015   HGB 12.4 01/22/2015   HCT 36.4 01/22/2015   MCV 79.0 01/22/2015   PLT  01/22/2015    PLATELET CLUMPS NOTED ON SMEAR, COUNT APPEARS ADEQUATE   Lab Results  Component Value Date   NA 137 01/22/2015   K 4.5 01/22/2015   CL 105 01/22/2015   CO2 22 01/22/2015    RADIOGRAPHIC STUDIES: I have personally reviewed the radiological reports and agreed with the findings in the report. CT chest reviewed and summarized as above  ASSESSMENT AND PLAN:   1. High grade malignancy: I spoke with the pathologist and they are still working on the cytology to definitively diagnose this. Apparently the number of cells that are abnormal are not adequate at this time to give a definite diagnosis. Dr.Rund is working on control get a second cell block for further evaluation.  This information was obtained after I spent significant amount of time discussing with the patient about lung adenocarcinoma and staging and treatment options.  I recommend waiting till we get full pathology diagnosis before proceeding with additional investigations. If the tissues not adequate, we might have to obtain a supraclavicular lymph node biopsy to get more definite diagnosis.   2. Renal Failure: In 2013, she had a creatinine of 1.9 (shes probably at her baseline)   At this point in time we will hold off on ordering any additional scans like MRIs of the brain or PET scans until I  know for sure what the diagnosis is.   Rulon Eisenmenger, MD 2:20 PM

## 2015-01-22 NOTE — Progress Notes (Addendum)
PULMONARY / CRITICAL CARE MEDICINE   Name: April Kennedy MRN: [id number] DOB: 11-27-1933    ADMISSION DATE:  01/16/2015 CONSULTATION DATE:  01/17/15  REFERRING MD :  [facility]   CHIEF COMPLAINT:  Dyspnea  INITIAL PRESENTATION:  81 yo female former smoker with progressive cough, dyspnea.  Found to have exudate Rt pleural effusion.     SIGNIFICANT EVENTS 7/01 Admit 7/02 IR thoracentesis 7/03 TCTS consulted 7/06 To ICU for respiratory distress 7/7 repeat IR thoracentesis> 1100cc removed  STUDIES: 7/02 Rt thoracentesis >> 800 ml, glucose 112, LDH 1546, protein 5, WBC 1666 (61%N) 7/02 Echo >> EF 55 to 78%, grade 1 diastolic dysfx, PAS 36 mmHg 7/03 Renal u/s >> b/l renal cysts 7/03 CT chest >> mediastinal LAN, Rt suprahilar fullness, Rt pleural fluid  SUBJECTIVE:  Feels OK this morning Thoracentesis yesterday  VITAL SIGNS: Temp:  [97.8 F (36.6 C)-98.6 F (37 C)] 98.4 F (36.9 C) (07/07 0700) Pulse Rate:  [77-106] 97 (07/07 0700) Resp:  [11-29] 15 (07/07 0700) BP: (122-163)/(55-81) 131/66 mmHg (07/07 0800) SpO2:  [93 %-100 %] 97 % (07/07 0700) INTAKE / OUTPUT:  Intake/Output Summary (Last 24 hours) at 01/22/15 0842 Last data filed at 01/22/15 0600  Gross per 24 hour  Intake      0 ml  Output   1375 ml  Net  -1375 ml    PHYSICAL EXAMINATION: General: no distress Neuro: Alert, normal strength HEENT: NCAT EOMi Lungs: diminished R base, few crackles R base, otherwise clear Cards: RRR, no mgr Abd: soft, non-tender MSK: no edema Skin: no rashes  LABS:  CBC  Recent Labs Lab 01/17/15 0620 01/18/15 0306 01/22/15 0219  WBC 9.6 8.7 12.5*  HGB 13.3 13.3 12.4  HCT 39.5 38.5 36.4  PLT 239 267 PLATELET CLUMPS NOTED ON SMEAR, COUNT APPEARS ADEQUATE   Coag's  Recent Labs Lab 01/18/15 0306  INR 1.18   BMET  Recent Labs Lab 01/20/15 0345 01/21/15 0635 01/22/15 0219  NA 137 137 137  K 3.6 3.9 4.5  CL 103 103 105  CO2 24 20* 22  BUN 26* 22* 26*   CREATININE 2.03* 1.94* 1.91*  GLUCOSE 164* 183* 182*   Electrolytes  Recent Labs Lab 01/20/15 0345 01/21/15 0635 01/22/15 0219  CALCIUM 8.0* 8.2* 8.4*   Sepsis Markers  Recent Labs Lab 01/21/15 0405  LATICACIDVEN 1.2   ABG  Recent Labs Lab 01/21/15 0250  PHART 7.402  PCO2ART 36.3  PO2ART 67.2*   Liver Enzymes  Recent Labs Lab 01/18/15 0306  AST 26  ALT 22  ALKPHOS 62  BILITOT 0.5  ALBUMIN 3.3*   Cardiac Enzymes  Recent Labs Lab 01/16/15 2001 01/17/15 0022 01/17/15 0620  TROPONINI 0.18* 0.16* 0.15*   Glucose No results for input(s): GLUCAP in the last 168 hours.  Imaging 7/7 CXR images personally reviewed> moderate R lung effusion   ASSESSMENT / PLAN:  Acute hypoxic respiratory failure 2nd to asthma exacerbation > improved Plan: - oxygen to keep SpO2 > 92% - continue xopenex - change solumedrol to prednisone, wean off over 5 days  Rt hilar fullness, mediastinal LAN, and exudate Rt pleural effusion >> concerning for malignancy. Cytology still pending, per path result likely out today Plan:  - f/u pleural fluid cytology - if cytology negative, then TCTS to set up EBUS  AKI: improving - repeat BMET in AM    Transfer to med - surg today, TRH service, PCCM off  Roselie Awkward, MD [facility] Pager: [phone] Cell: [phone] After  3pm or if no response, call [phone]

## 2015-01-22 NOTE — Progress Notes (Signed)
Dr. Lake Bells informed patient, daughter and grand-daughter of pathology results (adenocarcinoma in pleural effusions). Emotional support given to patient and family during and after meeting. Spent time in prayer with family and allowed patient and family to express what they were feeling.

## 2015-01-22 NOTE — Progress Notes (Signed)
[facility]  Called by pathology, pleural fluid positive for adenocarcinoma. Updated patient and her daughter and granddaughter at bedside Have placed oncology consult Will update Dr. Prescott Gum  Roselie Awkward, MD [facility] Pager: [phone] Cell: [phone] After 3pm or if no response, call [phone]

## 2015-01-22 NOTE — Plan of Care (Signed)
Problem: Phase I Progression Outcomes Goal: Discharge plan established Outcome: Completed/Met Date Met:  01/22/15 D/c to home. Large family to assist as needed.

## 2015-01-22 NOTE — Progress Notes (Signed)
Pt resting comfortably on nasal cannula at this time, in no distress and in no need of BiPAP at this time. RT will continue to monitor

## 2015-01-23 ENCOUNTER — Other Ambulatory Visit: Payer: Self-pay | Admitting: *Deleted

## 2015-01-23 DIAGNOSIS — J90 Pleural effusion, not elsewhere classified: Secondary | ICD-10-CM

## 2015-01-23 LAB — BASIC METABOLIC PANEL
Anion gap: 10 (ref 5–15)
BUN: 31 mg/dL — ABNORMAL HIGH (ref 6–20)
CALCIUM: 8.3 mg/dL — AB (ref 8.9–10.3)
CO2: 23 mmol/L (ref 22–32)
CREATININE: 1.93 mg/dL — AB (ref 0.44–1.00)
Chloride: 105 mmol/L (ref 101–111)
GFR calc Af Amer: 28 mL/min — ABNORMAL LOW (ref >60)
GFR calc non Af Amer: 24 mL/min — ABNORMAL LOW (ref >60)
GLUCOSE: 151 mg/dL — AB (ref 65–99)
Potassium: 4.3 mmol/L (ref 3.5–5.1)
Sodium: 138 mmol/L (ref 135–145)

## 2015-01-23 LAB — OTHER BODY FLUID CHEMISTRY

## 2015-01-23 MED ORDER — CEFUROXIME SODIUM 1.5 G IJ SOLR
1.5000 g | Freq: Three times a day (TID) | INTRAMUSCULAR | Status: DC
  Filled 2015-01-23 (×2): qty 1.5

## 2015-01-23 NOTE — Progress Notes (Signed)
Procedure(s) (LRB): INSERTION PLEURAL DRAINAGE CATHETER (Right) Subjective: Will plan R pleurx cath -pacement Mon 7-11 Recurrent R malignant effusion Objective: Vital signs in last 24 hours: Temp:  [97.4 F (36.3 C)-97.9 F (36.6 C)] 97.7 F (36.5 C) (07/08 0518) Pulse Rate:  [92-114] 92 (07/08 0800) Cardiac Rhythm:  [-] Sinus tachycardia;Normal sinus rhythm (07/08 0800) Resp:  [16-22] 19 (07/08 0800) BP: (130-155)/(54-116) 130/69 mmHg (07/08 0800) SpO2:  [95 %-97 %] 95 % (07/08 0800) Weight:  [186 lb 6.4 oz (84.55 kg)] 186 lb 6.4 oz (84.55 kg) (07/08 0518)  Hemodynamic parameters for last 24 hours:  stable  Intake/Output from previous day: 07/07 0701 - 07/08 0700 In: 780 [P.O.:780] Out: 750 [Urine:750] Intake/Output this shift:      Lab Results:  Recent Labs  01/22/15 0219  WBC 12.5*  HGB 12.4  HCT 36.4  PLT PLATELET CLUMPS NOTED ON SMEAR, COUNT APPEARS ADEQUATE   BMET:  Recent Labs  01/22/15 0219 01/23/15 0345  NA 137 138  K 4.5 4.3  CL 105 105  CO2 22 23  GLUCOSE 182* 151*  BUN 26* 31*  CREATININE 1.91* 1.93*  CALCIUM 8.4* 8.3*    PT/INR: No results for input(s): LABPROT, INR in the last 72 hours. ABG    Component Value Date/Time   PHART 7.402 01/21/2015 0250   HCO3 22.1 01/21/2015 0250   TCO2 23.2 01/21/2015 0250   ACIDBASEDEF 2.0 01/21/2015 0250   O2SAT 92.4 01/21/2015 0250   CBG (last 3)  No results for input(s): GLUCAP in the last 72 hours.  Assessment/Plan: S/P Procedure(s) (LRB): INSERTION PLEURAL DRAINAGE CATHETER (Right) pleurx cath 7-11 mid day in OR   LOS: 7 days    Tharon Aquas Trigt III 01/23/2015

## 2015-01-23 NOTE — Care Management (Signed)
Important Message  Patient Details  Name: April Kennedy MRN: [id number] Date of Birth: 1934-06-28   Medicare Important Message Given:  Yes-second notification given    Delorse Lek 01/23/2015, 10:37 AM

## 2015-01-23 NOTE — Progress Notes (Signed)
April Kennedy HCW:[id number] DOB: 09/01/1933 DOA: 01/16/2015 PCP: Kevan Ny, MD  Brief narrative:  81 y/o ? DMt tyII, CKD stgII, HLD, HTN independent at home, Admitted from PCP office as OP 2 vw CXR suggestive of Pleural effusion.  PAteitn was noted to have been sick ~ 1 wk prior to admission with cough/cold, and started having a cough/cold ~ may when her granddaughter graduated apparently was Rx with Abx 3/7 She was thoracocentesed  Past medical history-As per Problem list Chart reviewed as below- reviewed  Consultants:   Pulmonary  Procedures:  [place] thoracocentesis  STUDIES: 7/02 Rt thoracentesis >> 800 ml, glucose 112, LDH 1546, protein 5, WBC 1666 (61%N) 7/02 Echo >> EF 55 to 17%, grade 1 diastolic dysfx, PAS 36 mmHg 7/03 Renal u/s >> b/l renal cysts 7/03 CT chest >> mediastinal LAN, Rt suprahilar fullness, Rt pleural fluid     Subjective   Well Overall feels fair Many q's about cancer diagnosis No cp, no fever ,, no chills ,no n,v   Objective    Interim History:   Telemetry: PVC's   Objective: Filed Vitals:   01/23/15 0029 01/23/15 0518 01/23/15 0654 01/23/15 0800  BP:   136/66 130/69  Pulse:    92  Temp: 97.4 F (36.3 C) 97.7 F (36.5 C)    TempSrc: Oral Oral    Resp:    19  Height:      Weight:  84.55 kg (186 lb 6.4 oz)    SpO2:    95%    Intake/Output Summary (Last 24 hours) at 01/23/15 1235 Last data filed at 01/22/15 1900  Gross per 24 hour  Intake    480 ml  Output    500 ml  Net    -20 ml    Exam:  General: eomi, ncat, no pallor ict Cardiovascular: s1 s2 no m/r Respiratory: clear but ? fremitus/resonance to R post lung fields Abdomen: soft nt nd no rebound nor gaurd   Data Reviewed: Basic Metabolic Panel:  Recent Labs Lab 01/19/15 0326 01/20/15 0345 01/21/15 0635 01/22/15 0219 01/23/15 0345  NA 136 137 137 137 138  K 4.2 3.6 3.9 4.5 4.3  CL 102 103 103 105 105  CO2 25 24 20* 22 23  GLUCOSE 151* 164* 183* 182*  151*  BUN 29* 26* 22* 26* 31*  CREATININE 2.27* 2.03* 1.94* 1.91* 1.93*  CALCIUM 8.3* 8.0* 8.2* 8.4* 8.3*   Liver Function Tests:  Recent Labs Lab 01/17/15 1100 01/18/15 0306  AST  --  26  ALT  --  22  ALKPHOS  --  62  BILITOT  --  0.5  PROT 7.3 7.4  ALBUMIN  --  3.3*   No results for input(s): LIPASE, AMYLASE in the last 168 hours. No results for input(s): AMMONIA in the last 168 hours. CBC:  Recent Labs Lab 01/16/15 1532 01/17/15 0620 01/18/15 0306 01/22/15 0219  WBC 7.9 9.6 8.7 12.5*  NEUTROABS 5.2  --  5.3  --   HGB 12.5 13.3 13.3 12.4  HCT 37.0 39.5 38.5 36.4  MCV 78.4 79.6 77.6* 79.0  PLT 232 239 267 PLATELET CLUMPS NOTED ON SMEAR, COUNT APPEARS ADEQUATE   Cardiac Enzymes:  Recent Labs Lab 01/16/15 1532 01/16/15 2001 01/17/15 0022 01/17/15 0620  TROPONINI 0.15* 0.18* 0.16* 0.15*   BNP: Invalid input(s): POCBNP CBG: No results for input(s): GLUCAP in the last 168 hours.  Recent Results (from the past 240 hour(s))  Culture, body fluid-bottle  Status: None   Collection Time: 01/17/15 10:56 AM  Result Value Ref Range Status   Specimen Description FLUID RIGHT PLEURAL  Final   Special Requests NONE  Final   Culture NO GROWTH 5 DAYS  Final   Report Status 01/22/2015 FINAL  Final  Gram stain     Status: None   Collection Time: 01/17/15 10:56 AM  Result Value Ref Range Status   Specimen Description FLUID RIGHT PLEURAL  Final   Special Requests NONE  Final   Gram Stain   Final    FEW WBC PRESENT,BOTH PMN AND MONONUCLEAR NO ORGANISMS SEEN    Report Status 01/21/2015 FINAL  Final  MRSA PCR Screening     Status: None   Collection Time: 01/21/15  3:15 AM  Result Value Ref Range Status   MRSA by PCR NEGATIVE NEGATIVE Final    Comment:        The GeneXpert MRSA Assay (FDA approved for NASAL specimens only), is one component of a comprehensive MRSA colonization surveillance program. It is not intended to diagnose MRSA infection nor to guide  or monitor treatment for MRSA infections.      Studies:              All Imaging reviewed and is as per above notation   Scheduled Meds: . amLODipine  10 mg Oral Daily  . brimonidine  1 drop Both Eyes TID  . heparin  5,000 Units Subcutaneous 3 times per day  . latanoprost  1 drop Both Eyes QHS  . multivitamin with minerals  1 tablet Oral Daily  . predniSONE  30 mg Oral Q breakfast   Continuous Infusions:    Assessment/Plan:  Principal Problem:  Acute hypoxic resp failure -Tx to ICU transiently Much improved  Malignant Pleural effusion-Likely Lung/breast Primary  Appreciate pulm input, CVTS input and Oncologist input Will need Pleur-x drain palced Monday  Further rec's as per Dr. Lindi Adie are recommendations for Mammogram as well as further OP stag Rpt CBC + Diff in am-Prednisone and steroids liekly cause of elevated wbc    Glaucoma continue Alphagan +Latanaprost drops   Tobacco abuse co-cessation counseling may be needed   Hyperlipidemia Continue statin as OP   Hyperglycemia-monitor on am labs-no coverage right now   Elevated troponin-no CP-likely demand ischemia vs spurious cause in setting CKD-no further work-up CP-likely demand ischemia vs spurious 2/2 to CKDge   Acute kidney injury superimposed upon CKD (chronic kidney disease)Estimated Creatinine Clearance: 25.3 mL/min (by C-G formula based on Cr of 1.93).  -hold nephrotoxinx and monitor -Renal function holding steady    Code Status: full Family Communication:  Discussed with daughter bedside Disposition Plan:  inpatient    Verneita Griffes, MD  [facility] Pager [phone] 01/23/2015, 12:35 PM    LOS: 7 days

## 2015-01-23 NOTE — Care Management (Signed)
Important Message  Patient Details  Name: April Kennedy MRN: [id number] Date of Birth: [birth year]   Medicare Important Message Given:  Yes-third notification given    Nathen May 01/23/2015, 9:59 AM

## 2015-01-24 DIAGNOSIS — J910 Malignant pleural effusion: Secondary | ICD-10-CM

## 2015-01-24 LAB — CBC WITH DIFFERENTIAL/PLATELET
Basophils Absolute: 0 10*3/uL (ref 0.0–0.1)
Basophils Relative: 0 % (ref 0–1)
EOS PCT: 0 % (ref 0–5)
Eosinophils Absolute: 0 10*3/uL (ref 0.0–0.7)
HCT: 37.6 % (ref 36.0–46.0)
Hemoglobin: 12.6 g/dL (ref 12.0–15.0)
LYMPHS ABS: 1.9 10*3/uL (ref 0.7–4.0)
Lymphocytes Relative: 15 % (ref 12–46)
MCH: 26.4 pg (ref 26.0–34.0)
MCHC: 33.5 g/dL (ref 30.0–36.0)
MCV: 78.7 fL (ref 78.0–100.0)
Monocytes Absolute: 1.5 10*3/uL — ABNORMAL HIGH (ref 0.1–1.0)
Monocytes Relative: 12 % (ref 3–12)
NEUTROS ABS: 9.2 10*3/uL — AB (ref 1.7–7.7)
NEUTROS PCT: 73 % (ref 43–77)
Platelets: 313 10*3/uL (ref 150–400)
RBC: 4.78 MIL/uL (ref 3.87–5.11)
RDW: 13.5 % (ref 11.5–15.5)
WBC: 12.6 10*3/uL — ABNORMAL HIGH (ref 4.0–10.5)

## 2015-01-24 NOTE — Progress Notes (Signed)
SUBJECTIVE: Patient to get Pleurx catheter placement on Monday, shortness of breath is stable   OBJECTIVE PHYSICAL EXAMINATION: ECOG PERFORMANCE STATUS: 2 - Symptomatic, <50% confined to bed  Filed Vitals:   01/24/15 0723  BP:   Pulse:   Temp: 98.3 F (36.8 C)  Resp:    Filed Weights   01/16/15 1522 01/16/15 1828 01/23/15 0518  Weight: 184 lb 4 oz (83.575 kg) 182 lb (82.555 kg) 186 lb 6.4 oz (84.55 kg)    GENERAL:alert, no distress and comfortable SKIN: skin color, texture, turgor are normal, no rashes or significant lesions EYES: normal, Conjunctiva are pink and non-injected, sclera clear OROPHARYNX:no exudate, no erythema and lips, buccal mucosa, and tongue normal  NECK: supple, thyroid normal size, non-tender, without nodularity LUNGS: Diminished breath sounds at lung bases HEART: regular rate & rhythm and no murmurs and no lower extremity edema ABDOMEN:abdomen soft, non-tender and normal bowel sounds NEURO: alert & oriented x 3 with fluent speech, no focal motor/sensory deficits  LABORATORY DATA:  I have reviewed the data as listed CMP Latest Ref Rng 01/23/2015 01/22/2015 01/21/2015  Glucose 65 - 99 mg/dL 151(H) 182(H) 183(H)  BUN 6 - 20 mg/dL 31(H) 26(H) 22(H)  Creatinine 0.44 - 1.00 mg/dL 1.93(H) 1.91(H) 1.94(H)  Sodium 135 - 145 mmol/L 138 137 137  Potassium 3.5 - 5.1 mmol/L 4.3 4.5 3.9  Chloride 101 - 111 mmol/L 105 105 103  CO2 22 - 32 mmol/L 23 22 20(L)  Calcium 8.9 - 10.3 mg/dL 8.3(L) 8.4(L) 8.2(L)  Total Protein 6.5 - 8.1 g/dL - - -  Total Bilirubin 0.3 - 1.2 mg/dL - - -  Alkaline Phos 38 - 126 U/L - - -  AST 15 - 41 U/L - - -  ALT 14 - 54 U/L - - -      Lab Results  Component Value Date   WBC 12.6* 01/24/2015   HGB 12.6 01/24/2015   HCT 37.6 01/24/2015   MCV 78.7 01/24/2015   PLT 313 01/24/2015   NEUTROABS 9.2* 01/24/2015    ASSESSMENT AND PLAN:  1. Stage IV adenocarcinoma: Both the pleural fluid analysis came back as adenocarcinoma. Because the  immunohistochemistry markers are not conclusive, the differential diagnosis is between lung versus breast versus GI. Based on radiological findings of primarily lung involvement with hilar and mediastinal lymphadenopathy, I suspect this is primary lung. In order to be thorough I would like to send for a mammogram as an outpatient.  2. Family meeting: This morning I met with all of her family members including her daughters and her son. I discussed with all of them finding so radiology and pathology. Given that it is stage IV disease, surgery and radiation are not options. Systemic therapy with either biologic therapy or chemotherapy would be indicated.  3. In order to determine the best treatment plan, I requested pathology distended for Foundation 1 testing to see if she has any actionable mutations for oral therapies that we have for lung adenocarcinoma. These include EGFR, ALK, Ross 1. If there are no mutations, the only treatment would be with systemic chemotherapy. Carboplatin with Alimta would be a consideration.  Plan: 1. After discharge we will obtain PET CT scan, brain MRI, mammogram 2. Await the results of Foundation 1 testing  3. Follow-up in my clinic in the next 1-2 weeks with the results of the above tests. All of their questions have been answered.

## 2015-01-24 NOTE — Progress Notes (Signed)
April Kennedy PYK:[id number] DOB: 1934/03/14 DOA: 01/16/2015 PCP: Kevan Ny, MD  Brief narrative:  81 y/o ? DMt tyII, CKD stgII, HLD, HTN independent at home, Admitted from PCP office as OP 2 vw CXR suggestive of Pleural effusion.  Patient was noted to have been sick ~ 1 wk prior to admission with cough/cold, and started having a cough/cold ~ may when her granddaughter graduated apparently was Rx with Abx 3/7 She was thoracocentesed and ultimately the cytology proved top be concerning for Adenao Ca, unknown cause She is scheduled for Thoracocentesis  Past medical history-As per Problem list Chart reviewed as below- reviewed  Consultants:   Pulmonary  Procedures:  [place] thoracocentesis  STUDIES: 7/02 Rt thoracentesis >> 800 ml, glucose 112, LDH 1546, protein 5, WBC 1666 (61%N) 7/02 Echo >> EF 55 to 53%, grade 1 diastolic dysfx, PAS 36 mmHg 7/03 Renal u/s >> b/l renal cysts 7/03 CT chest >> mediastinal LAN, Rt suprahilar fullness, Rt pleural fluid     Subjective   Doing good Mild DOE No CP No n/v No unilateral weakness   Objective    Interim History:   Telemetry: PVC's   Objective: Filed Vitals:   01/24/15 0723 01/24/15 1000 01/24/15 1015 01/24/15 1129  BP:  136/59 150/56 153/60  Pulse:    99  Temp: 98.3 F (36.8 C)   97.8 F (36.6 C)  TempSrc: Oral   Oral  Resp:    15  Height:      Weight:      SpO2:    97%    Intake/Output Summary (Last 24 hours) at 01/24/15 1511 Last data filed at 01/24/15 1300  Gross per 24 hour  Intake    620 ml  Output      0 ml  Net    620 ml    Exam:  General: eomi, ncat, no pallor ict Cardiovascular: s1 s2 no m/r Respiratory: clear no rales no rhonci Abdomen: soft nt nd no rebound nor gaurd   Data Reviewed: Basic Metabolic Panel:  Recent Labs Lab 01/19/15 0326 01/20/15 0345 01/21/15 0635 01/22/15 0219 01/23/15 0345  NA 136 137 137 137 138  K 4.2 3.6 3.9 4.5 4.3  CL 102 103 103 105 105  CO2 25 24 20*  22 23  GLUCOSE 151* 164* 183* 182* 151*  BUN 29* 26* 22* 26* 31*  CREATININE 2.27* 2.03* 1.94* 1.91* 1.93*  CALCIUM 8.3* 8.0* 8.2* 8.4* 8.3*   Liver Function Tests:  Recent Labs Lab 01/18/15 0306  AST 26  ALT 22  ALKPHOS 62  BILITOT 0.5  PROT 7.4  ALBUMIN 3.3*   No results for input(s): LIPASE, AMYLASE in the last 168 hours. No results for input(s): AMMONIA in the last 168 hours. CBC:  Recent Labs Lab 01/18/15 0306 01/22/15 0219 01/24/15 0334  WBC 8.7 12.5* 12.6*  NEUTROABS 5.3  --  9.2*  HGB 13.3 12.4 12.6  HCT 38.5 36.4 37.6  MCV 77.6* 79.0 78.7  PLT 267 PLATELET CLUMPS NOTED ON SMEAR, COUNT APPEARS ADEQUATE 313   Cardiac Enzymes: No results for input(s): CKTOTAL, CKMB, CKMBINDEX, TROPONINI in the last 168 hours. BNP: Invalid input(s): POCBNP CBG: No results for input(s): GLUCAP in the last 168 hours.  Recent Results (from the past 240 hour(s))  Culture, body fluid-bottle     Status: None   Collection Time: 01/17/15 10:56 AM  Result Value Ref Range Status   Specimen Description FLUID RIGHT PLEURAL  Final   Special Requests NONE  Final   Culture NO GROWTH 5 DAYS  Final   Report Status 01/22/2015 FINAL  Final  Gram stain     Status: None   Collection Time: 01/17/15 10:56 AM  Result Value Ref Range Status   Specimen Description FLUID RIGHT PLEURAL  Final   Special Requests NONE  Final   Gram Stain   Final    FEW WBC PRESENT,BOTH PMN AND MONONUCLEAR NO ORGANISMS SEEN    Report Status 01/21/2015 FINAL  Final  MRSA PCR Screening     Status: None   Collection Time: 01/21/15  3:15 AM  Result Value Ref Range Status   MRSA by PCR NEGATIVE NEGATIVE Final    Comment:        The GeneXpert MRSA Assay (FDA approved for NASAL specimens only), is one component of a comprehensive MRSA colonization surveillance program. It is not intended to diagnose MRSA infection nor to guide or monitor treatment for MRSA infections.      Studies:              All Imaging  reviewed and is as per above notation   Scheduled Meds: . amLODipine  10 mg Oral Daily  . brimonidine  1 drop Both Eyes TID  . [START ON 01/26/2015] cefUROXime  1.5 g Intravenous Q8H  . heparin  5,000 Units Subcutaneous 3 times per day  . latanoprost  1 drop Both Eyes QHS  . multivitamin with minerals  1 tablet Oral Daily  . predniSONE  30 mg Oral Q breakfast   Continuous Infusions:    Assessment/Plan:  Principal Problem:  Acute hypoxic resp failure -Tx to ICU transiently Much improved  Malignant Pleural effusion-Likely Lung/breast Primary  Appreciate pulm input, CVTS input and Oncologist input Will need Pleur-x drain placed Monday  Further rec's as per Dr. Lindi Adie Rpt CBC + Diff in am-Prednisone and steroids likely cause of elevated wbc L CXR 01/22/15 shows persisting Stool    Glaucoma continue Alphagan +Latanaprost drops   Tobacco abuse co-cessation counseling may be needed   Hyperlipidemia Continue statin as OP   Hyperglycemia-monitor on am labs-no coverage right now   Elevated troponin-no CP-likely demand ischemia vs spurious cause in setting CKD-no further work-up CP-likely demand ischemia vs spurious 2/2 to CKDge   Acute kidney injury superimposed upon CKD (chronic kidney disease)Estimated Creatinine Clearance: 25.3 mL/min (by C-G formula based on Cr of 1.93).  -hold nephrotoxinx and monitor -Renal function holding steady    Code Status: full Family Communication:  Discussed with daughter bedside Disposition Plan:  inpatient    Verneita Griffes, MD  [facility] Pager [phone] 01/24/2015, 3:11 PM    LOS: 8 days

## 2015-01-25 LAB — COMPREHENSIVE METABOLIC PANEL
ALK PHOS: 59 U/L (ref 38–126)
ALT: 46 U/L (ref 14–54)
ANION GAP: 8 (ref 5–15)
AST: 27 U/L (ref 15–41)
Albumin: 2.9 g/dL — ABNORMAL LOW (ref 3.5–5.0)
BILIRUBIN TOTAL: 0.3 mg/dL (ref 0.3–1.2)
BUN: 30 mg/dL — ABNORMAL HIGH (ref 6–20)
CO2: 26 mmol/L (ref 22–32)
Calcium: 8.4 mg/dL — ABNORMAL LOW (ref 8.9–10.3)
Chloride: 103 mmol/L (ref 101–111)
Creatinine, Ser: 1.98 mg/dL — ABNORMAL HIGH (ref 0.44–1.00)
GFR calc non Af Amer: 23 mL/min — ABNORMAL LOW (ref >60)
GFR, EST AFRICAN AMERICAN: 27 mL/min — AB (ref >60)
GLUCOSE: 131 mg/dL — AB (ref 65–99)
Potassium: 4.4 mmol/L (ref 3.5–5.1)
SODIUM: 137 mmol/L (ref 135–145)
Total Protein: 6.6 g/dL (ref 6.5–8.1)

## 2015-01-25 LAB — CBC
HCT: 38.4 % (ref 36.0–46.0)
HEMOGLOBIN: 12.9 g/dL (ref 12.0–15.0)
MCH: 26.7 pg (ref 26.0–34.0)
MCHC: 33.6 g/dL (ref 30.0–36.0)
MCV: 79.3 fL (ref 78.0–100.0)
Platelets: 264 10*3/uL (ref 150–400)
RBC: 4.84 MIL/uL (ref 3.87–5.11)
RDW: 13.8 % (ref 11.5–15.5)
WBC: 13 10*3/uL — ABNORMAL HIGH (ref 4.0–10.5)

## 2015-01-25 NOTE — Progress Notes (Signed)
April Kennedy TSV:[id number] DOB: 11-04-1933 DOA: 01/16/2015 PCP: Kevan Ny, MD  Brief narrative:  81 y/o ? DMt tyII, CKD stgII, HLD, HTN independent at home, Admitted from PCP office as OP 2 vw CXR suggestive of Pleural effusion.  Patient was noted to have been sick ~ 1 wk prior to admission with cough/cold, and started having a cough/cold ~ may when her granddaughter graduated apparently was Rx with Abx 3/7 She was thoracocentesed and ultimately the cytology proved top be concerning for Adenao Ca, unknown cause She is scheduled for Thoracocentesis  Past medical history-As per Problem list Chart reviewed as below- reviewed  Consultants:   Pulmonary  Procedures:  [place] thoracocentesis  STUDIES: 7/02 Rt thoracentesis >> 800 ml, glucose 112, LDH 1546, protein 5, WBC 1666 (61%N) 7/02 Echo >> EF 55 to 92%, grade 1 diastolic dysfx, PAS 36 mmHg 7/03 Renal u/s >> b/l renal cysts 7/03 CT chest >> mediastinal LAN, Rt suprahilar fullness, Rt pleural fluid     Subjective   Well  Mild DOE with walking this morning Tolerating diet No chest pain Has some bad to pain which is being medicated   Objective    Interim History:   Telemetry: PVC's still   Objective: Filed Vitals:   01/24/15 2000 01/25/15 0012 01/25/15 0415 01/25/15 0721  BP: 176/61 141/74 139/73 128/58  Pulse: 106 85 93 87  Temp: 97.7 F (36.5 C) 97.5 F (36.4 C) 98.2 F (36.8 C) 97.9 F (36.6 C)  TempSrc: Oral Oral Oral Oral  Resp:   16 19  Height:      Weight:   85.14 kg (187 lb 11.2 oz)   SpO2: 95% 95% 99% 97%    Intake/Output Summary (Last 24 hours) at 01/25/15 1045 Last data filed at 01/25/15 0759  Gross per 24 hour  Intake    620 ml  Output      0 ml  Net    620 ml    Exam:  General: eomi, ncat, no pallor ict Cardiovascular: s1 s2 no m/r Respiratory: clear no rales no rhonci, increased fremitus and resonance to right lung fields Abdomen: soft nt nd no rebound nor gaurd   Data  Reviewed: Basic Metabolic Panel:  Recent Labs Lab 01/20/15 0345 01/21/15 0635 01/22/15 0219 01/23/15 0345 01/25/15 0349  NA 137 137 137 138 137  K 3.6 3.9 4.5 4.3 4.4  CL 103 103 105 105 103  CO2 24 20* '22 23 26  '$ GLUCOSE 164* 183* 182* 151* 131*  BUN 26* 22* 26* 31* 30*  CREATININE 2.03* 1.94* 1.91* 1.93* 1.98*  CALCIUM 8.0* 8.2* 8.4* 8.3* 8.4*   Liver Function Tests:  Recent Labs Lab 01/25/15 0349  AST 27  ALT 46  ALKPHOS 59  BILITOT 0.3  PROT 6.6  ALBUMIN 2.9*   No results for input(s): LIPASE, AMYLASE in the last 168 hours. No results for input(s): AMMONIA in the last 168 hours. CBC:  Recent Labs Lab 01/22/15 0219 01/24/15 0334 01/25/15 0349  WBC 12.5* 12.6* 13.0*  NEUTROABS  --  9.2*  --   HGB 12.4 12.6 12.9  HCT 36.4 37.6 38.4  MCV 79.0 78.7 79.3  PLT PLATELET CLUMPS NOTED ON SMEAR, COUNT APPEARS ADEQUATE 313 264   Cardiac Enzymes: No results for input(s): CKTOTAL, CKMB, CKMBINDEX, TROPONINI in the last 168 hours. BNP: Invalid input(s): POCBNP CBG: No results for input(s): GLUCAP in the last 168 hours.  Recent Results (from the past 240 hour(s))  Culture, body fluid-bottle  Status: None   Collection Time: 01/17/15 10:56 AM  Result Value Ref Range Status   Specimen Description FLUID RIGHT PLEURAL  Final   Special Requests NONE  Final   Culture NO GROWTH 5 DAYS  Final   Report Status 01/22/2015 FINAL  Final  Gram stain     Status: None   Collection Time: 01/17/15 10:56 AM  Result Value Ref Range Status   Specimen Description FLUID RIGHT PLEURAL  Final   Special Requests NONE  Final   Gram Stain   Final    FEW WBC PRESENT,BOTH PMN AND MONONUCLEAR NO ORGANISMS SEEN    Report Status 01/21/2015 FINAL  Final  MRSA PCR Screening     Status: None   Collection Time: 01/21/15  3:15 AM  Result Value Ref Range Status   MRSA by PCR NEGATIVE NEGATIVE Final    Comment:        The GeneXpert MRSA Assay (FDA approved for NASAL specimens only), is  one component of a comprehensive MRSA colonization surveillance program. It is not intended to diagnose MRSA infection nor to guide or monitor treatment for MRSA infections.      Studies:              All Imaging reviewed and is as per above notation   Scheduled Meds: . amLODipine  10 mg Oral Daily  . brimonidine  1 drop Both Eyes TID  . [START ON 01/26/2015] cefUROXime  1.5 g Intravenous Q8H  . heparin  5,000 Units Subcutaneous 3 times per day  . latanoprost  1 drop Both Eyes QHS  . multivitamin with minerals  1 tablet Oral Daily  . predniSONE  30 mg Oral Q breakfast   Continuous Infusions:    Assessment/Plan:  Principal Problem:  Acute hypoxic resp failure -Tx to ICU transiently Much improved  Malignant Pleural effusion-Likely Lung/breast Primary  Appreciate pulm input, CVTS input and Oncologist input Will need Pleur-x drain placed Monday by CVT S  Further rec's as per Dr. Lindi Adie Rpt CBC + Diff in am-Prednisone and steroids likely cause of elevated wbc L CXR 01/22/15 shows persisting effusion    Glaucoma continue Alphagan +Latanaprost drops   Tobacco abuse co-cessation counseling may be needed   Hyperlipidemia Continue statin as OP Ingrown toenail right toe-medicated with oral pain meds Norco every 6 when necessary   Hyperglycemia-monitor on am labs-no coverage right now   Elevated troponin-no CP-likely demand ischemia vs spurious cause in setting CKD-no further work-up CP-likely demand ischemia vs spurious 2/2 to CKDge   Acute kidney injury superimposed upon CKD (chronic kidney disease)Estimated Creatinine Clearance: 24.7 mL/min (by C-G formula based on Cr of 1.98).  -hold nephrotoxinx and monitor -Renal function holding steady    Code Status: full Family Communication:  Discussed with patient Disposition Plan:  inpatient    Verneita Griffes, MD  [facility] Pager [phone] 01/25/2015, 10:45 AM    LOS: 9 days

## 2015-01-26 ENCOUNTER — Encounter (HOSPITAL_COMMUNITY)
Admission: EM | Disposition: A | Discharge status: Home Health | Payer: Self-pay | Source: Home / Self Care | Attending: Family Medicine

## 2015-01-26 ENCOUNTER — Inpatient Hospital Stay (HOSPITAL_COMMUNITY): Payer: Medicare Other | Admitting: Certified Registered Nurse Anesthetist

## 2015-01-26 ENCOUNTER — Other Ambulatory Visit: Payer: Self-pay

## 2015-01-26 ENCOUNTER — Inpatient Hospital Stay (HOSPITAL_COMMUNITY): Payer: Medicare Other

## 2015-01-26 ENCOUNTER — Encounter (HOSPITAL_COMMUNITY): Payer: Self-pay | Admitting: Certified Registered Nurse Anesthetist

## 2015-01-26 DIAGNOSIS — J90 Pleural effusion, not elsewhere classified: Secondary | ICD-10-CM

## 2015-01-26 DIAGNOSIS — J910 Malignant pleural effusion: Secondary | ICD-10-CM

## 2015-01-26 HISTORY — PX: CHEST TUBE INSERTION: SHX231

## 2015-01-26 LAB — PROTIME-INR
INR: 1.18 (ref 0.00–1.49)
PROTHROMBIN TIME: 15.2 s (ref 11.6–15.2)

## 2015-01-26 LAB — GLUCOSE, CAPILLARY: Glucose-Capillary: 171 mg/dL — ABNORMAL HIGH (ref 65–99)

## 2015-01-26 SURGERY — INSERTION, PLEURAL DRAINAGE CATHETER
Anesthesia: Monitor Anesthesia Care | Site: Chest | Laterality: Right

## 2015-01-26 MED ORDER — LIDOCAINE HCL 1 % IJ SOLN
INTRAMUSCULAR | Status: DC | PRN
  Administered 2015-01-26: 10 mL

## 2015-01-26 MED ORDER — MIDAZOLAM HCL 5 MG/5ML IJ SOLN
INTRAMUSCULAR | Status: DC | PRN
  Administered 2015-01-26: 2 mg via INTRAVENOUS

## 2015-01-26 MED ORDER — SODIUM CHLORIDE 0.9 % IV SOLN
INTRAVENOUS | Status: DC
  Administered 2015-01-26 (×2): via INTRAVENOUS

## 2015-01-26 MED ORDER — HYDROMORPHONE HCL 1 MG/ML IJ SOLN
0.2500 mg | INTRAMUSCULAR | Status: DC | PRN
  Administered 2015-01-26 (×2): 0.5 mg via INTRAVENOUS

## 2015-01-26 MED ORDER — PROPOFOL 10 MG/ML IV BOLUS
INTRAVENOUS | Status: AC
  Filled 2015-01-26: qty 20

## 2015-01-26 MED ORDER — 0.9 % SODIUM CHLORIDE (POUR BTL) OPTIME
TOPICAL | Status: DC | PRN
  Administered 2015-01-26: 1000 mL

## 2015-01-26 MED ORDER — FENTANYL CITRATE (PF) 250 MCG/5ML IJ SOLN
INTRAMUSCULAR | Status: AC
  Filled 2015-01-26: qty 5

## 2015-01-26 MED ORDER — HYDROCODONE-ACETAMINOPHEN 5-325 MG PO TABS
1.0000 | ORAL_TABLET | ORAL | Status: DC | PRN
  Administered 2015-01-26 – 2015-01-27 (×4): 1 via ORAL
  Filled 2015-01-26 (×4): qty 1

## 2015-01-26 MED ORDER — HYDROMORPHONE HCL 1 MG/ML IJ SOLN
INTRAMUSCULAR | Status: AC
  Administered 2015-01-26: 0.5 mg via INTRAVENOUS
  Filled 2015-01-26: qty 1

## 2015-01-26 MED ORDER — FENTANYL CITRATE (PF) 100 MCG/2ML IJ SOLN
INTRAMUSCULAR | Status: DC | PRN
  Administered 2015-01-26: 25 ug via INTRAVENOUS

## 2015-01-26 MED ORDER — MEPERIDINE HCL 25 MG/ML IJ SOLN: 6.2500 mg | INTRAMUSCULAR | Status: DC | PRN

## 2015-01-26 MED ORDER — PHENYLEPHRINE HCL 10 MG/ML IJ SOLN
INTRAMUSCULAR | Status: DC | PRN
  Administered 2015-01-26 (×5): 80 ug via INTRAVENOUS

## 2015-01-26 MED ORDER — MIDAZOLAM HCL 2 MG/2ML IJ SOLN
INTRAMUSCULAR | Status: AC
  Filled 2015-01-26: qty 2

## 2015-01-26 MED ORDER — PROPOFOL INFUSION 10 MG/ML OPTIME
INTRAVENOUS | Status: DC | PRN
  Administered 2015-01-26: 50 ug/kg/min via INTRAVENOUS

## 2015-01-26 MED ORDER — PROMETHAZINE HCL 25 MG/ML IJ SOLN: 6.2500 mg | INTRAMUSCULAR | Status: DC | PRN

## 2015-01-26 MED ORDER — DEXTROSE 5 % IV SOLN
1.5000 g | INTRAVENOUS | Status: AC
  Administered 2015-01-26: 1.5 g via INTRAVENOUS
  Filled 2015-01-26: qty 1.5

## 2015-01-26 MED ORDER — ONDANSETRON HCL 4 MG/2ML IJ SOLN
INTRAMUSCULAR | Status: AC
  Filled 2015-01-26: qty 2

## 2015-01-26 MED ORDER — HYDROMORPHONE HCL 1 MG/ML IJ SOLN: 0.5000 mg | INTRAMUSCULAR | Status: DC | PRN

## 2015-01-26 SURGICAL SUPPLY — 31 items
CANISTER SUCTION 2500CC (MISCELLANEOUS) ×3 IMPLANT
COVER SURGICAL LIGHT HANDLE (MISCELLANEOUS) ×3 IMPLANT
DERMABOND ADVANCED (GAUZE/BANDAGES/DRESSINGS) ×2
DERMABOND ADVANCED .7 DNX12 (GAUZE/BANDAGES/DRESSINGS) ×1 IMPLANT
DRAPE C-ARM 42X72 X-RAY (DRAPES) ×3 IMPLANT
DRAPE LAPAROSCOPIC ABDOMINAL (DRAPES) ×3 IMPLANT
FILTER STRAW FLUID ASPIR (MISCELLANEOUS) ×3 IMPLANT
GLOVE BIO SURGEON STRL SZ7.5 (GLOVE) ×6 IMPLANT
GLOVE ECLIPSE 7.5 STRL STRAW (GLOVE) ×3 IMPLANT
GOWN STRL REUS W/ TWL LRG LVL3 (GOWN DISPOSABLE) ×1 IMPLANT
GOWN STRL REUS W/ TWL XL LVL3 (GOWN DISPOSABLE) ×1 IMPLANT
GOWN STRL REUS W/TWL LRG LVL3 (GOWN DISPOSABLE) ×2
GOWN STRL REUS W/TWL XL LVL3 (GOWN DISPOSABLE) ×2
KIT BASIN OR (CUSTOM PROCEDURE TRAY) ×3 IMPLANT
KIT PLEURX DRAIN CATH 1000ML (MISCELLANEOUS) ×9 IMPLANT
KIT PLEURX DRAIN CATH 15.5FR (DRAIN) ×3 IMPLANT
KIT PLEURX DRAIN CATH 500ML (KITS) ×3 IMPLANT
KIT ROOM TURNOVER OR (KITS) ×3 IMPLANT
NEEDLE HYPO 25GX1X1/2 BEV (NEEDLE) ×3 IMPLANT
NS IRRIG 1000ML POUR BTL (IV SOLUTION) ×3 IMPLANT
PACK GENERAL/GYN (CUSTOM PROCEDURE TRAY) ×3 IMPLANT
PAD ARMBOARD 7.5X6 YLW CONV (MISCELLANEOUS) ×6 IMPLANT
SET DRAINAGE LINE (MISCELLANEOUS) IMPLANT
SUT ETHILON 3 0 PS 1 (SUTURE) ×3 IMPLANT
SUT SILK 2 0 SH (SUTURE) ×3 IMPLANT
SUT VIC AB 3-0 SH 8-18 (SUTURE) ×3 IMPLANT
SYR CONTROL 10ML LL (SYRINGE) ×3 IMPLANT
TOWEL OR 17X24 6PK STRL BLUE (TOWEL DISPOSABLE) ×3 IMPLANT
TOWEL OR 17X26 10 PK STRL BLUE (TOWEL DISPOSABLE) ×3 IMPLANT
VALVE REPLACEMENT CAP (MISCELLANEOUS) IMPLANT
WATER STERILE IRR 1000ML POUR (IV SOLUTION) ×3 IMPLANT

## 2015-01-26 NOTE — Progress Notes (Signed)
The patient was examined and preop studies reviewed. There has been no change from the prior exam and the patient is ready for surgery.   Plan Right Pleurx catrheter on L L[place]owitz

## 2015-01-26 NOTE — Anesthesia Preprocedure Evaluation (Addendum)
Anesthesia Evaluation    Airway Mallampati: II  TM Distance: >3 FB Neck ROM: Full    Dental no notable dental hx. (+) Dental Advisory Given   Pulmonary asthma , former smoker,  breath sounds clear to auscultation  Pulmonary exam normal       Cardiovascular hypertension, Pt. on medications Normal cardiovascular examRhythm:Regular Rate:Normal     Neuro/Psych    GI/Hepatic   Endo/Other  diabetes, Type 2Morbid obesity  Renal/GU CRFRenal disease     Musculoskeletal   Abdominal   Peds  Hematology   Anesthesia Other Findings   Reproductive/Obstetrics                            Anesthesia Physical Anesthesia Plan  ASA: III  Anesthesia Plan: MAC   Post-op Pain Management:    Induction: Intravenous  Airway Management Planned:   Additional Equipment:   Intra-op Plan:   Post-operative Plan:   Informed Consent: I have reviewed the patients History and Physical, chart, labs and discussed the procedure including the risks, benefits and alternatives for the proposed anesthesia with the patient or authorized representative who has indicated his/her understanding and acceptance.   Dental advisory given  Plan Discussed with: CRNA  Anesthesia Plan Comments:         Anesthesia Quick Evaluation

## 2015-01-26 NOTE — Care Management Note (Signed)
Case Management Note  Patient Details  Name: April Kennedy MRN: [id number] Date of Birth: 07/01/1934  Subjective/Objective:     Pt post Pleurx Drain. Plan for d/c home 01-26-15               Action/Plan: CM did set pt up with [facility] services with [facility]. Referral made and SOC to begin within 24-48 hrs post d/c. Pleurx packet to be mailed off in am. No further needs from CM at this time.   Expected Discharge Date:                  Expected Discharge Plan:  [place]  In-House Referral:     Discharge planning Services  CM Consult  Post Acute Care Choice:  Home Health Choice offered to:     DME Arranged:    DME Agency:     HH Arranged:  RN [place] Agency:  [place]  Status of Service:  Completed, signed off  Medicare Important Message Given:  Yes-second notification given Date Medicare IM Given:    Medicare IM give by:    Date Additional Medicare IM Given:    Additional Medicare Important Message give by:     If discussed at [place] of Stay Meetings, dates discussed:    Additional Comments:  Bethena Roys, RN 01/26/2015, 5:11 PM

## 2015-01-26 NOTE — Care Management (Signed)
Important Message  Patient Details  Name: April Kennedy MRN: [id number] Date of Birth: June 14, 1934   Medicare Important Message Given:  Yes-second notification given    Nathen May 01/26/2015, 2:57 PM

## 2015-01-26 NOTE — Care Management Note (Signed)
Case Management Note  Patient Details  Name: FLOY ANGERT MRN: [id number] Date of Birth: October 03, 1933  Subjective/Objective: Pt admitted for SOB. Positive Pleural Effusion. 01-16-15 post thoracentesis. Pt with recurrent R malignant effusion. Plan for R Pleurx cath to be placed today.                    Action/Plan: CM will continue to monitor for disposition needs post procedure.    Expected Discharge Date:                  Expected Discharge Plan:  [place]  In-House Referral:     Discharge planning Services  CM Consult  Post Acute Care Choice:    Choice offered to:     DME Arranged:    DME Agency:     HH Arranged:    [place] Agency:     Status of Service:  In process, will continue to follow  Medicare Important Message Given:  Yes-second notification given Date Medicare IM Given:    Medicare IM give by:    Date Additional Medicare IM Given:    Additional Medicare Important Message give by:     If discussed at [place] of Stay Meetings, dates discussed:    Additional Comments:  Bethena Roys, RN 01/26/2015, 1:44 PM

## 2015-01-26 NOTE — Progress Notes (Signed)
Nurse practitioner Schorr paged regarding patient has no IV and IV team has attempted to obtain one unsuccessfully. Also patient is med-surg level of care and question her regarding if she needs to continued to be on tele monitoring. Await response. [facility] [employer]

## 2015-01-26 NOTE — Transfer of Care (Signed)
Immediate Anesthesia Transfer of Care Note  Patient: April Kennedy  Procedure(s) Performed: Procedure(s): INSERTION RIGHT PLEURAL DRAINAGE CATHETER (Right)  Patient Location: PACU  Anesthesia Type:MAC  Level of Consciousness: awake, alert  and oriented  Airway & Oxygen Therapy: Patient Spontanous Breathing and Patient connected to nasal cannula oxygen  Post-op Assessment: Report given to RN, Post -op Vital signs reviewed and stable and Patient moving all extremities X 4  Post vital signs: Reviewed and stable  Last Vitals:  Filed Vitals:   01/26/15 0500  BP: 145/66  Pulse: 93  Temp: 36.9 C  Resp: 20    Complications: No apparent anesthesia complications

## 2015-01-26 NOTE — Progress Notes (Signed)
April Kennedy NID:[id number] DOB: Feb 25, 1934 DOA: 01/16/2015 PCP: Kevan Ny, MD  Brief narrative:  81 y/o ? DMt tyII, CKD stgII, HLD, HTN independent at home, Admitted from PCP office as OP 2 vw CXR suggestive of Pleural effusion.  Patient was noted to have been sick ~ 1 wk prior to admission with cough/cold, and started having a cough/cold ~ may when her granddaughter graduated apparently was Rx with Abx 3/7 She was thoracocentesed and ultimately the cytology proved top be concerning for Adenao Ca, unknown cause She underwent Pleur-x Cath with 1.745 L drawn out per Dr. Nils Pyle 01/26/15  Past medical history-As per Problem list Chart reviewed as below- reviewed  Consultants:   Pulmonary  Procedures:  [place] thoracocentesis  STUDIES: 7/02 Rt thoracentesis >> 800 ml, glucose 112, LDH 1546, protein 5, WBC 1666 (61%N) 7/02 Echo >> EF 55 to 14%, grade 1 diastolic dysfx, PAS 36 mmHg 7/03 Renal u/s >> b/l renal cysts 7/03 CT chest >> mediastinal LAN, Rt suprahilar fullness, Rt pleural fluid     Subjective   DO much better No CP n/v Toe pain resolved  hungry and eating   Objective    Interim History:   Telemetry: PVC's still   Objective: Filed Vitals:   01/26/15 1545 01/26/15 1600 01/26/15 1634 01/26/15 1649  BP: 123/64 125/65 154/68 123/53  Pulse: 82 72 101 85  Temp:  98.2 F (36.8 C)    TempSrc:      Resp: '17 13 23 17  '$ Height:      Weight:      SpO2: 98% 97% 94% 95%    Intake/Output Summary (Last 24 hours) at 01/26/15 1725 Last data filed at 01/26/15 1634  Gross per 24 hour  Intake     50 ml  Output    200 ml  Net   -150 ml    Exam:  General: eomi, ncat, no pallor ict Cardiovascular: s1 s2 no m/r Respiratory: clear no rales no rhonci Abdomen: soft nt nd no rebound nor gaurd   Data Reviewed: Basic Metabolic Panel:  Recent Labs Lab 01/20/15 0345 01/21/15 0635 01/22/15 0219 01/23/15 0345 01/25/15 0349  NA 137 137 137 138 137  K 3.6 3.9  4.5 4.3 4.4  CL 103 103 105 105 103  CO2 24 20* '22 23 26  '$ GLUCOSE 164* 183* 182* 151* 131*  BUN 26* 22* 26* 31* 30*  CREATININE 2.03* 1.94* 1.91* 1.93* 1.98*  CALCIUM 8.0* 8.2* 8.4* 8.3* 8.4*   Liver Function Tests:  Recent Labs Lab 01/25/15 0349  AST 27  ALT 46  ALKPHOS 59  BILITOT 0.3  PROT 6.6  ALBUMIN 2.9*   No results for input(s): LIPASE, AMYLASE in the last 168 hours. No results for input(s): AMMONIA in the last 168 hours. CBC:  Recent Labs Lab 01/22/15 0219 01/24/15 0334 01/25/15 0349  WBC 12.5* 12.6* 13.0*  NEUTROABS  --  9.2*  --   HGB 12.4 12.6 12.9  HCT 36.4 37.6 38.4  MCV 79.0 78.7 79.3  PLT PLATELET CLUMPS NOTED ON SMEAR, COUNT APPEARS ADEQUATE 313 264   Cardiac Enzymes: No results for input(s): CKTOTAL, CKMB, CKMBINDEX, TROPONINI in the last 168 hours. BNP: Invalid input(s): POCBNP CBG:  Recent Labs Lab 01/26/15 1538  GLUCAP 171*    Recent Results (from the past 240 hour(s))  Culture, body fluid-bottle     Status: None   Collection Time: 01/17/15 10:56 AM  Result Value Ref Range Status   Specimen Description FLUID  RIGHT PLEURAL  Final   Special Requests NONE  Final   Culture NO GROWTH 5 DAYS  Final   Report Status 01/22/2015 FINAL  Final  Gram stain     Status: None   Collection Time: 01/17/15 10:56 AM  Result Value Ref Range Status   Specimen Description FLUID RIGHT PLEURAL  Final   Special Requests NONE  Final   Gram Stain   Final    FEW WBC PRESENT,BOTH PMN AND MONONUCLEAR NO ORGANISMS SEEN    Report Status 01/21/2015 FINAL  Final  MRSA PCR Screening     Status: None   Collection Time: 01/21/15  3:15 AM  Result Value Ref Range Status   MRSA by PCR NEGATIVE NEGATIVE Final    Comment:        The GeneXpert MRSA Assay (FDA approved for NASAL specimens only), is one component of a comprehensive MRSA colonization surveillance program. It is not intended to diagnose MRSA infection nor to guide or monitor treatment for MRSA  infections.      Studies:              All Imaging reviewed and is as per above notation   Scheduled Meds: . amLODipine  10 mg Oral Daily  . brimonidine  1 drop Both Eyes TID  . heparin  5,000 Units Subcutaneous 3 times per day  . latanoprost  1 drop Both Eyes QHS  . multivitamin with minerals  1 tablet Oral Daily  . predniSONE  30 mg Oral Q breakfast   Continuous Infusions: . sodium chloride 10 mL/hr at 01/26/15 1340     Assessment/Plan:  Principal Problem:  Acute hypoxic resp failure -Tx to ICU transiently Much improved  Malignant Pleural effusion-Likely Lung/breast Primary  Appreciate pulm input, CVTS input and Oncologist input Will need Pleur-x drain placed Monday by CVT S  Further rec's as per Dr. Lindi Adie Rpt CBC + Diff in am-Prednisone and steroids likely cause of elevated wbc L CXR 01/22/15 shows persisting effusion Pleur-xDrain placed 7/11--If am CXR wnnl-likely d/c home to follow up with CVTS/Oncology and follow up Path as OP    Glaucoma continue Alphagan +Latanaprost drops   Tobacco abuse co-cessation counseling may be needed   Hyperlipidemia Continue statin as OP Ingrown toenail right toe-medicated with oral pain meds Norco every 6 when necessary   Hyperglycemia-monitor on am labs-no coverage right now   Elevated troponin-no CP-likely demand ischemia vs spurious cause in setting CKD-no further work-up CP-likely demand ischemia vs spurious 2/2 to CKDge   Acute kidney injury superimposed upon CKD (chronic kidney disease)Estimated Creatinine Clearance: 24.4 mL/min (by C-G formula based on Cr of 1.98).  -hold nephrotoxinx and monitor -Renal function holding steady  15 min  Code Status: full Family Communication:  Discussed with patient a.one Disposition Plan:  Inpatient-d/c am 7/12    Verneita Griffes, MD  [facility] Pager [phone] 01/26/2015, 5:25 PM    LOS: 10 days

## 2015-01-26 NOTE — Brief Op Note (Signed)
01/16/2015 - 01/26/2015  3:25 PM  PATIENT:  April Kennedy  81 y.o. female  PRE-OPERATIVE DIAGNOSIS:  RIGHT PLEURAL EFFUSION  POST-OPERATIVE DIAGNOSIS:  RIGHT PLEURAL EFFUSION  PROCEDURE:  Procedure(s): INSERTION RIGHT PLEURAL DRAINAGE CATHETER (Right) and drainage 1.5 Liter R pleural effusion  SURGEON:  Surgeon(s) and Role:    * Ivin Poot, MD - Primary  PHYSICIAN ASSISTANT:   ASSISTANTS: none   ANESTHESIA:   local and IV sedation  EBL:  Total I/O In: 50 [I.V.:50] Out: -   BLOOD ADMINISTERED:none  DRAINS: pleurx cathetetr  LOCAL MEDICATIONS USED:  XYLOCAINE  and Amount: 8 ml  SPECIMEN:  No Specimen  DISPOSITION OF SPECIMEN:  N/A  COUNTS:  YES  TOURNIQUET:  * No tourniquets in log *  DICTATION: .Dragon Dictation  PLAN OF CARE: Admit to inpatient   PATIENT DISPOSITION:  PACU - hemodynamically stable.   Delay start of Pharmacological VTE agent (>24hrs) due to surgical blood loss or risk of bleeding: yes

## 2015-01-27 ENCOUNTER — Encounter (HOSPITAL_COMMUNITY): Payer: Self-pay | Admitting: Cardiothoracic Surgery

## 2015-01-27 ENCOUNTER — Inpatient Hospital Stay (HOSPITAL_COMMUNITY): Payer: Medicare Other

## 2015-01-27 LAB — BASIC METABOLIC PANEL
Anion gap: 7 (ref 5–15)
BUN: 28 mg/dL — AB (ref 6–20)
CALCIUM: 8.3 mg/dL — AB (ref 8.9–10.3)
CO2: 26 mmol/L (ref 22–32)
CREATININE: 1.86 mg/dL — AB (ref 0.44–1.00)
Chloride: 107 mmol/L (ref 101–111)
GFR calc Af Amer: 29 mL/min — ABNORMAL LOW (ref >60)
GFR calc non Af Amer: 25 mL/min — ABNORMAL LOW (ref >60)
GLUCOSE: 114 mg/dL — AB (ref 65–99)
Potassium: 4.8 mmol/L (ref 3.5–5.1)
Sodium: 140 mmol/L (ref 135–145)

## 2015-01-27 MED ORDER — HYDROCODONE-ACETAMINOPHEN 5-325 MG PO TABS: 1.0000 | ORAL_TABLET | ORAL | Status: AC | PRN

## 2015-01-27 MED ORDER — AMLODIPINE BESYLATE 10 MG PO TABS: 10.0000 mg | ORAL_TABLET | Freq: Every day | ORAL | Status: AC

## 2015-01-27 NOTE — Care Management (Signed)
01-27-15 1219 Pt to go to [facility] Store for [employer]. Pt is aware of cost. No further needs at this time. Bethena Roys, RN, BSN [phone]

## 2015-01-27 NOTE — Anesthesia Postprocedure Evaluation (Signed)
Anesthesia Post Note  Patient: April Kennedy  Procedure(s) Performed: Procedure(s) (LRB): INSERTION RIGHT PLEURAL DRAINAGE CATHETER (Right)  Anesthesia type: MAC  Patient location: PACU  Post pain: Pain level controlled  Post assessment: Post-op Vital signs reviewed  Last Vitals: BP 143/55 mmHg  Pulse 86  Temp(Src) 36.6 C (Oral)  Resp 18  Ht '5\' 4"'$  (1.626 m)  Wt 181 lb 10.5 oz (82.4 kg)  BMI 31.17 kg/m2  SpO2 96%  Post vital signs: Reviewed  Level of consciousness: awake  Complications: No apparent anesthesia complications

## 2015-01-27 NOTE — Op Note (Signed)
April Kennedy, April Kennedy NO.:  [id number]  MEDICAL RECORD NO.:  [id number]  LOCATION:  [id number]                        FACILITY:  [facility]  PHYSICIAN:  Ivin Poot, M.D.  DATE OF BIRTH:  Feb 21, 1934  DATE OF PROCEDURE:  01/26/2015 DATE OF DISCHARGE:                              OPERATIVE REPORT   OPERATION:  Placement of right PleurX catheter.  SURGEON:  Ivin Poot, M.D.  PREOPERATIVE DIAGNOSIS:  Recurrent malignant right pleural effusion, history of lung cancer.  POSTOPERATIVE DIAGNOSIS:  Recurrent malignant right pleural effusion, history of lung cancer.  ANESTHESIA:  Local 1% lidocaine with IV conscious monitored sedation by anesthesia team.  CLINICAL NOTE:  The patient is a 81 year old female who presented to the hospital with cough, shortness of breath, and a chest x-ray showing a fairly large pleural effusion.  She was started on antibiotics.  A thoracentesis was performed.  It took several days for the cytology to return as positive for adenocarcinoma.  The effusion recurred during this period of time and she required a second thoracentesis.  I saw the patient in consultation and recommended a PleurX catheter to help control the recurrent malignant effusion while she was been evaluated by Oncology.  I discussed the indications, benefits, and alternatives to a PleurX catheter, and she understood and agreed to proceed with surgery.  OPERATIVE PROCEDURE:  The patient was brought to the operating room and placed supine on the operating table with the right side turned up under a soft blanket.  The right chest and upper abdomen were prepped and draped as a sterile field.  The patient was given intravenous sedation and monitored by anesthesia team.  A proper time-out was performed. After the prep and drape, the lidocaine was infiltrated in the anterior axillary line at the fifth interspace for the insertion site and at the costal margin at the  midclavicular line for the exit site.  Two small incisions were made in these areas that were locally anesthetized.  To the upper incision, a  needle with sheath was inserted in the pleural space and immediately serosanguineous fluid was withdrawn and the needle was withdrawn and the sheath was left in place.  Through the sheath, a guidewire was passed into the pleural space and confirmed by C-arm.  The catheter was then tunneled from the lower incision to the upper incision.  Over the guidewire, the dilator and then the dilator sheath was inserted in the upper incision.  This immediately drained large amounts of pleural fluid under pressure.  Through the tear-away sheath, the PleurX catheter was advanced into pleural space and the sheath was removed.  The catheter was connected to a vacuum bottle and 1.5 L of fluid was removed.  The upper incision was closed with interrupted Vicryl and then interrupted nylon for the skin.  The lower incision was closed with a silk suture which was used to secure the catheter site to the skin.  A sterile dressing was applied after the drainage was performed, and the patient returned to recovery room in stable condition.     Ivin Poot, M.D.     PV/MEDQ  D:  01/26/2015  T:  01/27/2015  Job:  [id number]

## 2015-01-27 NOTE — Progress Notes (Signed)
Responded to page to assist patient with advance directives.  Documents were complete and copies given to patient and nurse for chart. Will follow as needed

## 2015-01-27 NOTE — Discharge Summary (Signed)
Physician Discharge Summary  April Kennedy YPP:[id number] DOB: [birth year] DOA: 01/16/2015  PCP: Kevan Ny, MD  Admit date: 01/16/2015 Discharge date: 01/27/2015  Time spent: 40 minutes  Recommendations for Outpatient Follow-up:  1. Will probably need a complete metabolic panel, CBC in about one week 2. Consider chest x-ray 1 week 3. Awaiting further cytology to determine primary cancer-as per Dr. Lindi Adie will be copied on this note 4. Recommend no further steroid use   Discharge Diagnoses:  Principal Problem:   Pleural effusion, right Active Problems:   Glaucoma   Elevated troponin   Essential hypertension   Acute renal failure   Chronic kidney disease, stage 3   Enlarged lymph nodes   Discharge Condition: good  Diet recommendation: Heart healthy low-salt  Filed Weights   01/25/15 0415 01/26/15 0500 01/27/15 0519  Weight: 85.14 kg (187 lb 11.2 oz) 82.555 kg (182 lb) 82.4 kg (181 lb 10.5 oz)    History of present illness:  81 y/o ? DMt tyII, CKD stgII, HLD, HTN independent at home, Admitted from PCP office as OP 2 vw CXR suggestive of Pleural effusion.  Patient was noted to have been sick ~ 1 wk prior to admission with cough/cold, and started having a cough/cold ~ may when her granddaughter graduated apparently was Rx with Abx 3/7 She had a transient stay on step down unit She was thoracocentesed and ultimately the cytology proved top be concerning for [place] Ca, unknown cause She underwent Pleur-x Cath with 1.745 L drawn out per Dr. 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 Nils Pyle and 1.7 L was removed The final pathology is still pending [allegedly was sent to [place]]  - Further rec's as per Dr. Lindi Adie  oncologist as an outpatient who we will schedule a one-week follow-up appointment with -Patient may need to be discussed subsequent to this with tumor board  Rpt CBC + Diff in am-Prednisone and steroids likely cause of elevated wbc -We did discontinue sclerae on day of discharge Pleur-xDrain placed 7/11-- 01/27/15 am CXR wnl with improvement of effusion -d/c home to follow up with CVTS/Oncology and follow up Path as OP    Glaucoma continue Alphagan +Latanaprost drops   Tobacco abuse co-cessation counseling may be needed   Hyperlipidemia Continue statin as OP Ingrown toenail right toe-medicated with oral pain meds Norco every 6 when necessary -Patient states she'll follow-up with her own PCP as an outpatient   Hyperglycemia-monitor on am labs-no coverage right now   Elevated troponin-no CP -likely demand ischemia vs spurious cause in setting CKD-no further work-up CP-likely demand ischemia vs spurious 2/2 to CKDge   Acute kidney injury superimposed upon CKD (chronic kidney disease)Estimated Creatinine Clearance: 24.4 mL/min (by C-G formula based on Cr of 1.98).  -hold nephrotoxinx and monitor -Renal function holding steady   Procedures:  [place] thoracocentesis  Pleurx drain catheter with pigtail placed 01/26/59  STUDIES: 7/02 Rt thoracentesis >> 800 ml, glucose 112, LDH 1546, protein 5, WBC 1666 (61%N) 7/02 Echo >> EF 55 to 45%, grade 1 diastolic dysfx, PAS 36 mmHg 7/03 Renal u/s >> b/l renal cysts 7/03 CT chest >> mediastinal LAN, Rt suprahilar fullness, Rt pleural fluid   Discharge Exam: Filed Vitals:   01/27/15 0519  BP: 143/55  Pulse: 86  Temp: 97.8 F (36.6 C)  Resp: 18    Genera alert pleasant oriented  Cardiovascular: s1 s2 no m/r/g Respiratory: imporved  Discharge Instructions   Discharge Instructions    Diet - low sodium heart healthy    Complete by:  As directed      Discharge instructions    Complete by:  As directed   Please follow-up with oncologist for  further instructions about your care He will need further lab work as well as further management of her chronic medical conditions I would recommend getting a home health nurse to come out and see you and manage your chest tube     Increase activity slowly    Complete by:  As directed           Current Discharge Medication List    START taking these medications   Details  amLODipine (NORVASC) 10 MG tablet Take 1 tablet (10 mg total) by mouth daily. Qty: 30 tablet, Refills: 0    HYDROcodone-acetaminophen (NORCO/VICODIN) 5-325 MG per tablet Take 1 tablet by mouth every 4 (four) hours as needed for moderate pain or severe pain. Qty: 30 tablet, Refills: 0      CONTINUE these medications which have NOT CHANGED   Details  bimatoprost (LUMIGAN) 0.01 % SOLN Place 1 drop into both eyes at bedtime.     brimonidine (ALPHAGAN P) 0.1 % SOLN Place 1 drop into both eyes 3 (three) times daily.     Multiple Vitamin (MULTIVITAMIN) tablet Take 1 tablet by mouth daily.      STOP taking these medications     amLODipine-benazepril (LOTREL) 10-20 MG per capsule        Allergies  Allergen Reactions  . Sulfa Antibiotics     Hives/ itching    Follow-up Information    Follow up with [facility].   Why:  Registered Nurse   Contact information:   [address] [place] [address] [phone]        The results of significant diagnostics from this hospitalization (including imaging, microbiology, ancillary and laboratory) are listed below for reference.    Significant Diagnostic Studies: Dg Chest 1 View  01/21/2015   CLINICAL DATA:  Post right-sided thoracentesis  EXAM: CHEST  1 VIEW  COMPARISON:  7 scratch 01/21/2015  FINDINGS: Interval slight decrease in now moderate-sized right pleural effusion obscuring the right lung base. No pneumothorax. Minimal prominence of the interstitial markings could indicate borderline interstitial edema. Trace left pleural fluid is  stable. Heart size mildly enlarged.  IMPRESSION: Decrease in size of now moderate right pleural effusion without pneumothorax after thoracentesis.   Electronically Signed   By: Conchita Paris M.D.   On: 01/21/2015 13:37   Dg Chest 2 View  01/27/2015   CLINICAL DATA:  Indwelling PleurX drainage catheter for a malignant right pleural effusion. Followup right pleural effusion and atelectasis versus pneumonia involving the right lower lobe.  EXAM: CHEST  2 VIEW  COMPARISON:  01/26/2015 and earlier.  FINDINGS: PleurX drainage catheter in the right pleural space. Moderate-sized likely partially loculated right pleural effusion, slightly improved since the 1542 hr examination yesterday, significantly improved since the 1123 hr examination yesterday. Associated consolidation in the right middle lobe and right lower lobe, unchanged. Mass in the medial right upper lobe in the right suprahilar region as noted on the prior CT. Asymmetric interstitial pulmonary opacities in the right lung, unchanged when compared to the prior CT. Left lung remains clear. No new abnormalities.  Cardiac silhouette normal in size, unchanged. Thoracic aorta mildly tortuous and atherosclerotic, unchanged. Mediastinal lymphadenopathy as noted on the prior CT.  IMPRESSION: 1. Slight improvement in the likely at least partially loculated right pleural effusion since yesterday. 2. Stable passive atelectasis and/or pneumonia in the right middle lobe and right lower lobe. 3. Stable asymmetric interstitial opacities throughout the right lung, query lymphangitic spread of tumor. 4. No new abnormalities.   Electronically Signed   By: Evangeline Dakin M.D.   On: 01/27/2015 10:35   Dg Chest 2 View  01/20/2015   CLINICAL DATA:  Acute respiratory failure with hypoxemia. Right pleural effusion. Lymphoma.  EXAM: CHEST  2 VIEW  COMPARISON:  01/18/2015  FINDINGS: Moderate right pleural effusion shows mild increase in size since previous study. There is  increased right basilar compressive atelectasis.  Left lung remains clear. Heart size is stable. Right paratracheal soft tissue fullness again seen, consistent with lymphadenopathy as shown on recent chest CT.  IMPRESSION: Increased size of moderate right pleural effusion and right basilar atelectasis.  Persistent right paratracheal soft tissue fullness, consistent with mediastinal lymphadenopathy.   Electronically Signed   By: Earle Gell M.D.   On: 01/20/2015 08:29   Dg Chest 2 View  01/18/2015   CLINICAL DATA:  Right-sided thoracentesis. Persistent shortness of breath.  EXAM: CHEST  2 VIEW  COMPARISON:  1 day prior  FINDINGS: Midline trachea. Mild cardiomegaly with a mildly tortuous thoracic aorta. Atherosclerosis in the transverse aorta. A moderate right pleural effusion is not significantly changed. No left-sided pleural fluid. No pneumothorax. Right hilar soft tissue fullness is similar. Clear left lung. Right base airspace disease is not significantly changed.  IMPRESSION: No significant change since one day prior.  Moderate right pleural effusion with adjacent airspace disease.  Cardiomegaly with transverse aortic atherosclerosis.  Right hilar soft tissue fullness, as before.   Electronically Signed   By: Abigail Miyamoto M.D.   On: 01/18/2015 09:12   Dg Chest 2 View  01/16/2015   CLINICAL DATA:  Fluid on the RIGHT lung. Short of breath. Cough for 1 week.  EXAM: CHEST  2 VIEW  COMPARISON:  01/16/2015 chest radiograph at 1154 hours.  FINDINGS: There is a moderate RIGHT pleural effusion. Collapse/consolidation at the RIGHT lung base involving the RIGHT middle and RIGHT lower lobe. LEFT lung appears within normal limits aside from basilar atelectasis. Cardiopericardial silhouette partially obscured. Aortic arch atherosclerosis.  There is fullness of the RIGHT hilum, which may represent relaxation atelectasis and retraction of the lung however a RIGHT hilar mass cannot be excluded.  IMPRESSION: Moderate  unilateral RIGHT pleural effusion with collapse/consolidation of the RIGHT middle and RIGHT lower lobes. RIGHT hilar fullness. The differential considerations are primarily pneumonia with parapneumonic effusion or malignant effusion. Consider thoracentesis with cytology contrast-enhanced chest CT may be useful for further evaluation.   Electronically Signed   By: Dereck Ligas M.D.   On: 01/16/2015 15:57   Dg Chest 2 View  01/16/2015   CLINICAL DATA:  Shortness of breath with exertion, chest tightness and fatigue for 10 days; cold symptoms 4 weeks ago without full recovery; history of asthma and diabetes, discontinued smoking 2 years ago.  EXAM: CHEST  2 VIEW  COMPARISON:  PA and lateral chest of Dec 03, 2010  FINDINGS: There is volume loss on the right occupying approximately 1-1/3 to 1/2 of the lung volume. There is no mediastinal shift. The right upper lobe is clear as is the left lung. The heart and  pulmonary vascularity are normal. The bony thorax is unremarkable.  IMPRESSION: Findings compatible with large right pleural effusion. Underlying parenchymal consolidation is suspected as well. Further evaluation with ultrasound would be useful to judge if patient is a candidate for thoracentesis. Chest CT scanning will likely be needed to evaluate the right pleural space more completely.   Electronically Signed   By: David  Martinique M.D.   On: 01/16/2015 11:50   Ct Chest Wo Contrast  01/18/2015   CLINICAL DATA:  Worsening cough and shortness of breath.  Empyema.  EXAM: CT CHEST WITHOUT CONTRAST  TECHNIQUE: Multidetector CT imaging of the chest was performed following the standard protocol without IV contrast.  COMPARISON:  Chest radiograph 01/18/2015  FINDINGS: There is extensive mediastinal lymphadenopathy. Right paratracheal lymph node on sequence 201, image 15 measures 1.6 cm in the short axis. Precarinal lymph node measures 1.9 cm on image 22. Enlarged subcarinal tissue measures 2.6 cm in the short axis on  image 29. There is also soft tissue fullness in the right suprahilar region on image 27. Evaluation of the hilar disease is difficult without intravenous contrast. There is right pleural fluid throughout the right chest which appears to be complex or mildly loculated. There is no significant left pleural fluid. No evidence for axillary lymphadenopathy. There is bilateral supraclavicular lymphadenopathy. Largest left supraclavicular lymph node measures 1.4 cm in the short axis on image 5. Images of the upper abdomen suggest scarring or atrophy in the right kidney. Possible cyst involving left kidney upper pole. No gross abnormality in the visualized liver.  The trachea and mainstem bronchi are are patent. There is volume loss in the right lower lobe related to the pleural fluid. There is also volume loss with air bronchograms in the right middle lobe. There are small nodular densities scattered throughout the right upper lobe. Dominant right upper lobe nodule measures 1.0 cm on sequence 205, image 30. In addition there may be a primary mass in the right upper lobe that roughly measures up to 2.4 cm on sequence 205, image 19. This lesion is along the medial right upper lobe. No significant nodularity or airspace disease in the left lung.  No acute bone abnormality.  IMPRESSION: There are scattered right pulmonary nodules and concern for a primary lesion along the medial right upper lobe. In addition, there is extensive mediastinal, right hilar and supraclavicular lymphadenopathy. Findings are concerning for a neoplastic process and the lesion along the medial right upper lobe could be the primary lesion. The supraclavicular lymphadenopathy may amendable to percutaneous sampling.  Small to moderate sized loculated right pleural effusion with volume loss in the right lower lobe and right middle lobe.  These results were called by telephone at the time of interpretation on 01/18/2015 at 10:37 am to Dr. Wynelle Cleveland , who  verbally acknowledged these results.   Electronically Signed   By: Markus Daft M.D.   On: 01/18/2015 10:41   Dg Chest Right Decubitus  01/16/2015   CLINICAL DATA:  Chest pain and shortness of breath, acute onset. Initial encounter.  EXAM: CHEST - RIGHT DECUBITUS  COMPARISON:  Chest radiograph performed earlier today at 3:39 p.m.  FINDINGS: A moderate right-sided pleural effusion is again noted, demonstrating some degree of layering. Underlying right basilar airspace opacification is again seen. The right hilar prominence is not well assessed due to overlying soft tissues.  The cardiomediastinal silhouette remains grossly normal in size, though difficult to fully characterize. No acute osseous abnormalities are seen.  IMPRESSION: Moderate  right-sided pleural effusion again noted, demonstrating some degree of layering. Underlying airspace opacification again seen. Known right hilar prominence not well assessed due to overlying soft tissues. As before, the differential is primarily pneumonia with parapneumonic effusion, versus a malignant effusion. Contrast-enhanced CT of the chest, or diagnostic thoracentesis with cytology, may be helpful for further evaluation.   Electronically Signed   By: Garald Balding M.D.   On: 01/16/2015 19:11   US Renal  01/18/2015   CLINICAL DATA:  Acute renal failure, history hypertension, chronic kidney disease, diabetes mellitus, hyperlipidemia, former smoker  EXAM: RENAL / URINARY TRACT ULTRASOUND COMPLETE  COMPARISON:  None  FINDINGS: Right Kidney:  Length: 8.6 cm. Normal cortical thickness and echogenicity. No solid mass, hydronephrosis or shadowing calcification. Tiny hypoechoic nodule at inferior pole 11 x 10 x 9 mm question cyst.  Left Kidney:  Length: 10.6 cm. Normal cortical thickness and echogenicity. Multiple cysts largest no solid mass, hydronephrosis or shadowing calcification. 2.2 x 2.0 x 2.3 cm at upper pole.  Bladder:  Grossly normal appearance for partially distended  state.  RIGHT pleural effusion noted.  Insula in the noted increased echogenicity liver likely representing fatty infiltration.  IMPRESSION: Probable small BILATERAL renal cysts.  No evidence of solid renal mass or hydronephrosis.  Small RIGHT pleural effusion.  Probable fatty infiltration of liver.   Electronically Signed   By: Lavonia Dana M.D.   On: 01/18/2015 10:56   Dg Chest 2v Repeat Same Day  01/17/2015   CLINICAL DATA:  Post right thoracentesis. Current history of hypertension, diabetes and chronic kidney disease.  EXAM: CHEST - 2 VIEW  COMPARISON:  01/15/2005 and earlier.  FINDINGS: No evidence of a right pneumothorax after thoracentesis. Residual moderately large right pleural effusion, slightly decreased in size. Dense consolidation in the right middle lobe and right lower lobe, unchanged. No new pulmonary parenchymal abnormalities. Right hilar prominence, unchanged.  IMPRESSION: 1. No evidence of pneumothorax after right thoracentesis. 2. Slight decrease in size of the still moderately large right pleural effusion. 3. Dense consolidation in the right middle lobe and right lower lobe. 4. Right hilar prominence as noted on yesterday's examination. 5. No new abnormalities.   Electronically Signed   By: Evangeline Dakin M.D.   On: 01/17/2015 12:32   Dg Chest Port 1 View  01/26/2015   CLINICAL DATA:  Right pleural effusion.  Chest tube insertion.  EXAM: PORTABLE CHEST - 1 VIEW at 3:42 p.m.;  DG C-ARM 1-60 MIN - NRPT [facility]  COMPARISON:  Chest x-ray dated 01/26/2015 at 11:23 a.m.  FINDINGS: Right-sided chest tube is been inserted and appears in good position at the lung base. There has been a marked reduction right pleural effusion with a small residual right effusion and slight atelectasis at the right base. Left lung is clear. Heart size and vascularity are normal. Calcification in the thoracic aorta. No acute osseous abnormality. No pneumothorax.  IMPRESSION: Decreased right pleural effusion. Chest tube  appears in good position.  Aortic atherosclerosis.   Electronically Signed   By: Lorriane Shire M.D.   On: 01/26/2015 15:49   Dg Chest Port 1 View  01/26/2015   CLINICAL DATA:  Right pleural effusion  EXAM: PORTABLE CHEST - 1 VIEW  COMPARISON:  January 22, 2015  FINDINGS: There is a right pleural effusion which has increased in size. There is right middle and lower lobe atelectasis/ consolidation. Left lung is clear. Heart size and pulmonary vascularity are normal. No adenopathy. There is atherosclerotic change in  aorta. No bone lesions.  IMPRESSION: Right pleural effusion has increased in size compared to recent prior study. There is right pole and lower lobe atelectasis/consolidation superimposed. Left lung clear. No change in cardiac silhouette.   Electronically Signed   By: Lowella Grip III M.D.   On: 01/26/2015 11:32   Dg Chest Port 1 View  01/22/2015   CLINICAL DATA:  Pleural effusion.  EXAM: PORTABLE CHEST - 1 VIEW  COMPARISON:  01/21/2015.  CT 01/18/2015.  FINDINGS: Mediastinal fullness present consistent with known adenopathy. Stable cardiomegaly. Right lower lobe pulmonary atelectasis and/or infiltrate and right pleural effusion. Represent made to prior chest CT report for discussion of pulmonary nodules present. Pneumothorax. No acute bony abnormality .  IMPRESSION: 1. Right lower lobe atelectasis and/or infiltrate with right pleural effusion, no interim change. 2. Mediastinal fullness consistent with known adenopathy. Reference made to prior CT report of 01/18/2015 for discussion of pulmonary nodules present. 3. Stable cardiomegaly.  No pulmonary venous congestion.   Electronically Signed   By: Marcello Moores  Register   On: 01/22/2015 07:46   Dg Chest Port 1 View  01/21/2015   CLINICAL DATA:  Shortness of breath  EXAM: PORTABLE CHEST - 1 VIEW  COMPARISON:  01/20/2015  FINDINGS: Stable moderate right pleural effusion with lower lobe atelectasis based on recent chest CT. Mediastinal and right hilar  lymphadenopathy is better seen on previous CT imaging. The left lung remains clear. No cardiomegaly.  IMPRESSION: 1. Stable moderate right pleural effusion with lower lobe collapse/consolidation. 2. Suspected intrathoracic malignancy by chest CT 01/18/2015.   Electronically Signed   By: Monte Fantasia M.D.   On: 01/21/2015 02:19   Dg C-arm 1-60 Min-no Report  01/26/2015   CLINICAL DATA: surgery   C-ARM 1-60 MINUTES  Fluoroscopy was utilized by the requesting physician.  No radiographic  interpretation.    US Thoracentesis Asp Pleural Space W/img Guide  01/21/2015   CLINICAL DATA:  Right Pleural Effusion  EXAM: ULTRASOUND GUIDED RIGHT THORACENTESIS  COMPARISON:  None.  PROCEDURE: An ultrasound guided thoracentesis was thoroughly discussed with the patient and questions answered. The benefits, risks, alternatives and complications were also discussed. The patient understands and wishes to proceed with the procedure. Written consent was obtained.  Ultrasound was performed to localize and mark an adequate pocket of fluid in the right chest. The area was then prepped and draped in the normal sterile fashion. 1% Lidocaine was used for local anesthesia. Under ultrasound guidance a 19 gauge Yueh catheter was introduced. Thoracentesis was performed. The catheter was removed and a dressing applied.  COMPLICATIONS: None.  FINDINGS: A total of approximately 1100 mls of bloody fluid was removed. A fluid sample wassent for laboratory analysis.  IMPRESSION: Successful ultrasound guided right thoracentesis yielding 1100 mls of pleural fluid.  Read By:  Gareth Eagle, PA-C   Electronically Signed   By: Jerilynn Mages.  Shick M.D.   On: 01/21/2015 14:38   US Thoracentesis Asp Pleural Space W/img Guide  01/18/2015   CLINICAL DATA:  Right pleural effusion  EXAM: EXAM THORACENTESIS WITH ULTRASOUND  TECHNIQUE: The procedure, risks (including but not limited to bleeding, infection, organ damage ), benefits, and alternatives were explained to the  patient. Questions regarding the procedure were encouraged and answered. The patient understands and consents to the procedure. Survey ultrasound of the right hemithorax was performed and an appropriate skin entry site was localized. Site was marked, prepped with Betadine, draped in usual sterile fashion, infiltrated locally with 1% lidocaine.  The Saf-T-Centesis needle  was advanced into the pleural space. Clear yellow fluid returned. 800 mL was removed. Post procedure imaging shows minimal residual fluid. The patient tolerated procedure well.  COMPLICATIONS: COMPLICATIONS None immediate  IMPRESSION: Technically successful ultrasound-guided right thoracentesis. Follow-up chest radiograph pending.   Electronically Signed   By: Lucrezia Europe M.D.   On: 01/18/2015 09:37    Microbiology: Recent Results (from the past 240 hour(s))  MRSA PCR Screening     Status: None   Collection Time: 01/21/15  3:15 AM  Result Value Ref Range Status   MRSA by PCR NEGATIVE NEGATIVE Final    Comment:        The GeneXpert MRSA Assay (FDA approved for NASAL specimens only), is one component of a comprehensive MRSA colonization surveillance program. It is not intended to diagnose MRSA infection nor to guide or monitor treatment for MRSA infections.      Labs: Basic Metabolic Panel:  Recent Labs Lab 01/21/15 0635 01/22/15 0219 01/23/15 0345 01/25/15 0349 01/27/15 0510  NA 137 137 138 137 140  K 3.9 4.5 4.3 4.4 4.8  CL 103 105 105 103 107  CO2 20* '22 23 26 26  '$ GLUCOSE 183* 182* 151* 131* 114*  BUN 22* 26* 31* 30* 28*  CREATININE 1.94* 1.91* 1.93* 1.98* 1.86*  CALCIUM 8.2* 8.4* 8.3* 8.4* 8.3*   Liver Function Tests:  Recent Labs Lab 01/25/15 0349  AST 27  ALT 46  ALKPHOS 59  BILITOT 0.3  PROT 6.6  ALBUMIN 2.9*   No results for input(s): LIPASE, AMYLASE in the last 168 hours. No results for input(s): AMMONIA in the last 168 hours. CBC:  Recent Labs Lab 01/22/15 0219 01/24/15 0334  01/25/15 0349  WBC 12.5* 12.6* 13.0*  NEUTROABS  --  9.2*  --   HGB 12.4 12.6 12.9  HCT 36.4 37.6 38.4  MCV 79.0 78.7 79.3  PLT PLATELET CLUMPS NOTED ON SMEAR, COUNT APPEARS ADEQUATE 313 264   Cardiac Enzymes: No results for input(s): CKTOTAL, CKMB, CKMBINDEX, TROPONINI in the last 168 hours. BNP: BNP (last 3 results)  Recent Labs  01/16/15 1532  BNP 32.3    ProBNP (last 3 results) No results for input(s): PROBNP in the last 8760 hours.  CBG:  Recent Labs Lab 01/26/15 1538  GLUCAP 171*       SignedNita Sells  [facility] 01/27/2015, 11:29 AM

## 2015-01-28 ENCOUNTER — Other Ambulatory Visit: Payer: Self-pay

## 2015-01-28 ENCOUNTER — Other Ambulatory Visit: Payer: Self-pay | Admitting: Hematology and Oncology

## 2015-01-28 DIAGNOSIS — C50919 Malignant neoplasm of unspecified site of unspecified female breast: Secondary | ICD-10-CM

## 2015-01-29 ENCOUNTER — Telehealth: Payer: Self-pay | Admitting: Hematology and Oncology

## 2015-01-29 ENCOUNTER — Ambulatory Visit
Admission: RE | Admit: 2015-01-29 | Discharge: 2015-01-29 | Disposition: A | Discharge status: Home / Self Care | Payer: Medicare Other | Source: Ambulatory Visit | Attending: Hematology and Oncology | Admitting: Hematology and Oncology

## 2015-01-29 DIAGNOSIS — C50919 Malignant neoplasm of unspecified site of unspecified female breast: Secondary | ICD-10-CM

## 2015-01-29 NOTE — Telephone Encounter (Signed)
COnfirmed appointment for 07/15 MRI/PET

## 2015-01-30 ENCOUNTER — Encounter (HOSPITAL_COMMUNITY)
Admission: RE | Admit: 2015-01-30 | Discharge: 2015-01-30 | Disposition: A | Discharge status: Home / Self Care | Payer: Medicare Other | Source: Ambulatory Visit | Attending: Hematology and Oncology | Admitting: Hematology and Oncology

## 2015-01-30 ENCOUNTER — Encounter (HOSPITAL_COMMUNITY): Payer: Self-pay | Admitting: Family Medicine

## 2015-01-30 ENCOUNTER — Ambulatory Visit (HOSPITAL_COMMUNITY)
Admission: RE | Admit: 2015-01-30 | Discharge: 2015-01-30 | Disposition: A | Discharge status: Home / Self Care | Payer: Medicare Other | Source: Ambulatory Visit | Attending: Hematology and Oncology | Admitting: Hematology and Oncology

## 2015-01-30 ENCOUNTER — Telehealth: Payer: Self-pay

## 2015-01-30 ENCOUNTER — Inpatient Hospital Stay (HOSPITAL_COMMUNITY)
Admission: EM | Admit: 2015-01-30 | Discharge: 2015-02-16 | DRG: 270 | Disposition: E | Payer: Medicare Other | Attending: Internal Medicine | Admitting: Internal Medicine

## 2015-01-30 ENCOUNTER — Encounter (HOSPITAL_COMMUNITY): Payer: Self-pay | Admitting: Emergency Medicine

## 2015-01-30 ENCOUNTER — Emergency Department (HOSPITAL_COMMUNITY): Payer: Medicare Other

## 2015-01-30 ENCOUNTER — Emergency Department (HOSPITAL_BASED_OUTPATIENT_CLINIC_OR_DEPARTMENT_OTHER): Payer: Medicare Other

## 2015-01-30 ENCOUNTER — Emergency Department (INDEPENDENT_AMBULATORY_CARE_PROVIDER_SITE_OTHER)
Admission: EM | Admit: 2015-01-30 | Discharge: 2015-01-30 | Disposition: A | Discharge status: Hospice | Payer: Medicare Other | Source: Home / Self Care | Attending: Family Medicine | Admitting: Family Medicine

## 2015-01-30 DIAGNOSIS — E872 Acidosis: Secondary | ICD-10-CM | POA: Diagnosis not present

## 2015-01-30 DIAGNOSIS — I70261 Atherosclerosis of native arteries of extremities with gangrene, right leg: Secondary | ICD-10-CM | POA: Diagnosis present

## 2015-01-30 DIAGNOSIS — N390 Urinary tract infection, site not specified: Secondary | ICD-10-CM | POA: Diagnosis not present

## 2015-01-30 DIAGNOSIS — J90 Pleural effusion, not elsewhere classified: Secondary | ICD-10-CM

## 2015-01-30 DIAGNOSIS — Z882 Allergy status to sulfonamides status: Secondary | ICD-10-CM

## 2015-01-30 DIAGNOSIS — I70212 Atherosclerosis of native arteries of extremities with intermittent claudication, left leg: Secondary | ICD-10-CM

## 2015-01-30 DIAGNOSIS — J9601 Acute respiratory failure with hypoxia: Secondary | ICD-10-CM | POA: Diagnosis not present

## 2015-01-30 DIAGNOSIS — N183 Chronic kidney disease, stage 3 unspecified: Secondary | ICD-10-CM | POA: Diagnosis present

## 2015-01-30 DIAGNOSIS — H409 Unspecified glaucoma: Secondary | ICD-10-CM | POA: Diagnosis present

## 2015-01-30 DIAGNOSIS — J45909 Unspecified asthma, uncomplicated: Secondary | ICD-10-CM | POA: Diagnosis present

## 2015-01-30 DIAGNOSIS — J910 Malignant pleural effusion: Secondary | ICD-10-CM

## 2015-01-30 DIAGNOSIS — M79672 Pain in left foot: Secondary | ICD-10-CM | POA: Diagnosis not present

## 2015-01-30 DIAGNOSIS — R000 Tachycardia, unspecified: Secondary | ICD-10-CM | POA: Diagnosis not present

## 2015-01-30 DIAGNOSIS — I739 Peripheral vascular disease, unspecified: Secondary | ICD-10-CM | POA: Diagnosis present

## 2015-01-30 DIAGNOSIS — I129 Hypertensive chronic kidney disease with stage 1 through stage 4 chronic kidney disease, or unspecified chronic kidney disease: Secondary | ICD-10-CM | POA: Diagnosis present

## 2015-01-30 DIAGNOSIS — Z515 Encounter for palliative care: Secondary | ICD-10-CM

## 2015-01-30 DIAGNOSIS — F419 Anxiety disorder, unspecified: Secondary | ICD-10-CM | POA: Diagnosis not present

## 2015-01-30 DIAGNOSIS — G8918 Other acute postprocedural pain: Secondary | ICD-10-CM | POA: Diagnosis not present

## 2015-01-30 DIAGNOSIS — L600 Ingrowing nail: Secondary | ICD-10-CM | POA: Diagnosis present

## 2015-01-30 DIAGNOSIS — I10 Essential (primary) hypertension: Secondary | ICD-10-CM | POA: Diagnosis present

## 2015-01-30 DIAGNOSIS — E1122 Type 2 diabetes mellitus with diabetic chronic kidney disease: Secondary | ICD-10-CM | POA: Diagnosis present

## 2015-01-30 DIAGNOSIS — N170 Acute kidney failure with tubular necrosis: Secondary | ICD-10-CM | POA: Diagnosis not present

## 2015-01-30 DIAGNOSIS — Z7189 Other specified counseling: Secondary | ICD-10-CM | POA: Diagnosis not present

## 2015-01-30 DIAGNOSIS — N184 Chronic kidney disease, stage 4 (severe): Secondary | ICD-10-CM | POA: Diagnosis present

## 2015-01-30 DIAGNOSIS — Z9889 Other specified postprocedural states: Secondary | ICD-10-CM

## 2015-01-30 DIAGNOSIS — A419 Sepsis, unspecified organism: Secondary | ICD-10-CM | POA: Diagnosis not present

## 2015-01-30 DIAGNOSIS — R0602 Shortness of breath: Secondary | ICD-10-CM

## 2015-01-30 DIAGNOSIS — M47892 Other spondylosis, cervical region: Secondary | ICD-10-CM | POA: Insufficient documentation

## 2015-01-30 DIAGNOSIS — C3490 Malignant neoplasm of unspecified part of unspecified bronchus or lung: Secondary | ICD-10-CM | POA: Diagnosis present

## 2015-01-30 DIAGNOSIS — Z8249 Family history of ischemic heart disease and other diseases of the circulatory system: Secondary | ICD-10-CM | POA: Diagnosis not present

## 2015-01-30 DIAGNOSIS — R0689 Other abnormalities of breathing: Secondary | ICD-10-CM | POA: Diagnosis not present

## 2015-01-30 DIAGNOSIS — N179 Acute kidney failure, unspecified: Secondary | ICD-10-CM | POA: Insufficient documentation

## 2015-01-30 DIAGNOSIS — I70201 Unspecified atherosclerosis of native arteries of extremities, right leg: Secondary | ICD-10-CM | POA: Diagnosis present

## 2015-01-30 DIAGNOSIS — M7989 Other specified soft tissue disorders: Secondary | ICD-10-CM

## 2015-01-30 DIAGNOSIS — K5900 Constipation, unspecified: Secondary | ICD-10-CM | POA: Diagnosis not present

## 2015-01-30 DIAGNOSIS — C771 Secondary and unspecified malignant neoplasm of intrathoracic lymph nodes: Secondary | ICD-10-CM | POA: Diagnosis present

## 2015-01-30 DIAGNOSIS — R6889 Other general symptoms and signs: Secondary | ICD-10-CM

## 2015-01-30 DIAGNOSIS — J9600 Acute respiratory failure, unspecified whether with hypoxia or hypercapnia: Secondary | ICD-10-CM | POA: Insufficient documentation

## 2015-01-30 DIAGNOSIS — J95821 Acute postprocedural respiratory failure: Secondary | ICD-10-CM | POA: Diagnosis not present

## 2015-01-30 DIAGNOSIS — Z87891 Personal history of nicotine dependence: Secondary | ICD-10-CM | POA: Insufficient documentation

## 2015-01-30 DIAGNOSIS — M79676 Pain in unspecified toe(s): Secondary | ICD-10-CM

## 2015-01-30 DIAGNOSIS — M79674 Pain in right toe(s): Secondary | ICD-10-CM | POA: Diagnosis present

## 2015-01-30 DIAGNOSIS — R1310 Dysphagia, unspecified: Secondary | ICD-10-CM | POA: Diagnosis not present

## 2015-01-30 DIAGNOSIS — I509 Heart failure, unspecified: Secondary | ICD-10-CM | POA: Diagnosis not present

## 2015-01-30 DIAGNOSIS — C384 Malignant neoplasm of pleura: Secondary | ICD-10-CM | POA: Diagnosis present

## 2015-01-30 DIAGNOSIS — E785 Hyperlipidemia, unspecified: Secondary | ICD-10-CM | POA: Diagnosis present

## 2015-01-30 DIAGNOSIS — I5032 Chronic diastolic (congestive) heart failure: Secondary | ICD-10-CM | POA: Diagnosis present

## 2015-01-30 DIAGNOSIS — Z85118 Personal history of other malignant neoplasm of bronchus and lung: Secondary | ICD-10-CM | POA: Diagnosis not present

## 2015-01-30 DIAGNOSIS — I998 Other disorder of circulatory system: Secondary | ICD-10-CM | POA: Diagnosis present

## 2015-01-30 DIAGNOSIS — Z79899 Other long term (current) drug therapy: Secondary | ICD-10-CM | POA: Insufficient documentation

## 2015-01-30 DIAGNOSIS — Z978 Presence of other specified devices: Secondary | ICD-10-CM

## 2015-01-30 DIAGNOSIS — L03031 Cellulitis of right toe: Secondary | ICD-10-CM

## 2015-01-30 DIAGNOSIS — M79609 Pain in unspecified limb: Secondary | ICD-10-CM

## 2015-01-30 DIAGNOSIS — L03116 Cellulitis of left lower limb: Secondary | ICD-10-CM | POA: Diagnosis present

## 2015-01-30 DIAGNOSIS — Z66 Do not resuscitate: Secondary | ICD-10-CM | POA: Diagnosis present

## 2015-01-30 DIAGNOSIS — E1152 Type 2 diabetes mellitus with diabetic peripheral angiopathy with gangrene: Secondary | ICD-10-CM | POA: Diagnosis present

## 2015-01-30 DIAGNOSIS — Z4659 Encounter for fitting and adjustment of other gastrointestinal appliance and device: Secondary | ICD-10-CM

## 2015-01-30 DIAGNOSIS — L03115 Cellulitis of right lower limb: Secondary | ICD-10-CM | POA: Diagnosis not present

## 2015-01-30 DIAGNOSIS — L0390 Cellulitis, unspecified: Secondary | ICD-10-CM | POA: Diagnosis present

## 2015-01-30 DIAGNOSIS — D649 Anemia, unspecified: Secondary | ICD-10-CM | POA: Diagnosis present

## 2015-01-30 DIAGNOSIS — I70222 Atherosclerosis of native arteries of extremities with rest pain, left leg: Secondary | ICD-10-CM | POA: Diagnosis not present

## 2015-01-30 HISTORY — DX: Secondary malignant neoplasm of unspecified site: C79.9

## 2015-01-30 LAB — COMPREHENSIVE METABOLIC PANEL
ALBUMIN: 2.9 g/dL — AB (ref 3.5–5.0)
ALT: 33 U/L (ref 14–54)
AST: 33 U/L (ref 15–41)
Alkaline Phosphatase: 59 U/L (ref 38–126)
Anion gap: 12 (ref 5–15)
BILIRUBIN TOTAL: 0.7 mg/dL (ref 0.3–1.2)
BUN: 19 mg/dL (ref 6–20)
CALCIUM: 8.6 mg/dL — AB (ref 8.9–10.3)
CO2: 24 mmol/L (ref 22–32)
CREATININE: 1.86 mg/dL — AB (ref 0.44–1.00)
Chloride: 102 mmol/L (ref 101–111)
GFR calc non Af Amer: 25 mL/min — ABNORMAL LOW (ref >60)
GFR, EST AFRICAN AMERICAN: 29 mL/min — AB (ref >60)
Glucose, Bld: 153 mg/dL — ABNORMAL HIGH (ref 65–99)
POTASSIUM: 4.1 mmol/L (ref 3.5–5.1)
Sodium: 138 mmol/L (ref 135–145)
TOTAL PROTEIN: 6.7 g/dL (ref 6.5–8.1)

## 2015-01-30 LAB — CBC WITH DIFFERENTIAL/PLATELET
Basophils Absolute: 0 10*3/uL (ref 0.0–0.1)
Basophils Relative: 0 % (ref 0–1)
EOS ABS: 0.2 10*3/uL (ref 0.0–0.7)
Eosinophils Relative: 2 % (ref 0–5)
HCT: 38 % (ref 36.0–46.0)
HEMOGLOBIN: 12.9 g/dL (ref 12.0–15.0)
Lymphocytes Relative: 14 % (ref 12–46)
Lymphs Abs: 1.8 10*3/uL (ref 0.7–4.0)
MCH: 26.7 pg (ref 26.0–34.0)
MCHC: 33.9 g/dL (ref 30.0–36.0)
MCV: 78.5 fL (ref 78.0–100.0)
MONO ABS: 1.2 10*3/uL — AB (ref 0.1–1.0)
Monocytes Relative: 10 % (ref 3–12)
NEUTROS PCT: 74 % (ref 43–77)
Neutro Abs: 9 10*3/uL — ABNORMAL HIGH (ref 1.7–7.7)
PLATELETS: 127 10*3/uL — AB (ref 150–400)
RBC: 4.84 MIL/uL (ref 3.87–5.11)
RDW: 14.2 % (ref 11.5–15.5)
WBC: 12.2 10*3/uL — ABNORMAL HIGH (ref 4.0–10.5)

## 2015-01-30 LAB — GLUCOSE, CAPILLARY: Glucose-Capillary: 170 mg/dL — ABNORMAL HIGH (ref 65–99)

## 2015-01-30 LAB — I-STAT CG4 LACTIC ACID, ED: Lactic Acid, Venous: 1.71 mmol/L (ref 0.5–2.0)

## 2015-01-30 LAB — I-STAT TROPONIN, ED: Troponin i, poc: 0.03 ng/mL (ref 0.00–0.08)

## 2015-01-30 MED ORDER — GADOBENATE DIMEGLUMINE 529 MG/ML IV SOLN
10.0000 mL | Freq: Once | INTRAVENOUS | Status: AC | PRN
  Administered 2015-01-30: 7 mL via INTRAVENOUS

## 2015-01-30 MED ORDER — BRIMONIDINE TARTRATE 0.2 % OP SOLN
1.0000 [drp] | Freq: Three times a day (TID) | OPHTHALMIC | Status: DC
  Administered 2015-01-31 – 2015-02-05 (×16): 1 [drp] via OPHTHALMIC
  Filled 2015-01-30 (×3): qty 5

## 2015-01-30 MED ORDER — ENOXAPARIN SODIUM 30 MG/0.3ML ~~LOC~~ SOLN
30.0000 mg | SUBCUTANEOUS | Status: DC
  Administered 2015-01-31 – 2015-02-01 (×2): 30 mg via SUBCUTANEOUS
  Filled 2015-01-30 (×3): qty 0.3

## 2015-01-30 MED ORDER — ACETAMINOPHEN 325 MG PO TABS: 650.0000 mg | ORAL_TABLET | Freq: Four times a day (QID) | ORAL | Status: DC | PRN

## 2015-01-30 MED ORDER — FLUDEOXYGLUCOSE F - 18 (FDG) INJECTION
9.0000 | Freq: Once | INTRAVENOUS | Status: AC | PRN
  Administered 2015-01-30: 9 via INTRAVENOUS

## 2015-01-30 MED ORDER — ALBUTEROL SULFATE (2.5 MG/3ML) 0.083% IN NEBU
3.0000 mL | INHALATION_SOLUTION | Freq: Four times a day (QID) | RESPIRATORY_TRACT | Status: DC | PRN
  Administered 2015-02-04: 3 mL via RESPIRATORY_TRACT
  Filled 2015-01-30: qty 3

## 2015-01-30 MED ORDER — ONDANSETRON HCL 4 MG PO TABS: 4.0000 mg | ORAL_TABLET | Freq: Four times a day (QID) | ORAL | Status: DC | PRN

## 2015-01-30 MED ORDER — LATANOPROST 0.005 % OP SOLN
1.0000 [drp] | Freq: Every day | OPHTHALMIC | Status: DC
Start: 2015-01-30 — End: 2015-02-05
  Administered 2015-01-31 – 2015-02-04 (×6): 1 [drp] via OPHTHALMIC
  Filled 2015-01-30 (×3): qty 2.5

## 2015-01-30 MED ORDER — PIPERACILLIN-TAZOBACTAM 3.375 G IVPB
3.3750 g | Freq: Three times a day (TID) | INTRAVENOUS | Status: DC
  Administered 2015-01-31 – 2015-02-04 (×13): 3.375 g via INTRAVENOUS
  Filled 2015-01-30 (×19): qty 50

## 2015-01-30 MED ORDER — SODIUM CHLORIDE 0.9 % IV SOLN
INTRAVENOUS | Status: AC
  Administered 2015-01-31: via INTRAVENOUS

## 2015-01-30 MED ORDER — HYDROCODONE-ACETAMINOPHEN 5-325 MG PO TABS
1.0000 | ORAL_TABLET | ORAL | Status: DC | PRN
  Administered 2015-01-31: 1 via ORAL
  Filled 2015-01-30: qty 1

## 2015-01-30 MED ORDER — SODIUM CHLORIDE 0.9 % IJ SOLN
3.0000 mL | Freq: Two times a day (BID) | INTRAMUSCULAR | Status: DC
  Administered 2015-01-31 – 2015-02-07 (×4): 3 mL via INTRAVENOUS
  Administered 2015-02-07 – 2015-02-08 (×2): 10 mL via INTRAVENOUS
  Administered 2015-02-11: 3 mL via INTRAVENOUS

## 2015-01-30 MED ORDER — PIPERACILLIN-TAZOBACTAM 3.375 G IVPB 30 MIN
3.3750 g | Freq: Once | INTRAVENOUS | Status: AC
  Administered 2015-01-31: 3.375 g via INTRAVENOUS
  Filled 2015-01-30: qty 50

## 2015-01-30 MED ORDER — ASPIRIN EC 81 MG PO TBEC
81.0000 mg | DELAYED_RELEASE_TABLET | Freq: Every day | ORAL | Status: DC
Start: 2015-01-31 — End: 2015-02-08
  Administered 2015-01-31 – 2015-02-06 (×6): 81 mg via ORAL
  Filled 2015-01-30 (×10): qty 1

## 2015-01-30 MED ORDER — INSULIN ASPART 100 UNIT/ML ~~LOC~~ SOLN
0.0000 [IU] | SUBCUTANEOUS | Status: DC
  Administered 2015-01-31 (×4): 1 [IU] via SUBCUTANEOUS

## 2015-01-30 MED ORDER — ONDANSETRON HCL 4 MG/2ML IJ SOLN: 4.0000 mg | Freq: Four times a day (QID) | INTRAMUSCULAR | Status: DC | PRN

## 2015-01-30 MED ORDER — VANCOMYCIN HCL IN DEXTROSE 750-5 MG/150ML-% IV SOLN
750.0000 mg | INTRAVENOUS | Status: DC
  Administered 2015-01-31 – 2015-02-05 (×5): 750 mg via INTRAVENOUS
  Filled 2015-01-30 (×8): qty 150

## 2015-01-30 MED ORDER — DORZOLAMIDE HCL 2 % OP SOLN
1.0000 [drp] | Freq: Three times a day (TID) | OPHTHALMIC | Status: DC
  Administered 2015-01-31 – 2015-02-05 (×17): 1 [drp] via OPHTHALMIC
  Filled 2015-01-30 (×3): qty 10

## 2015-01-30 MED ORDER — OXYCODONE-ACETAMINOPHEN 5-325 MG PO TABS
1.0000 | ORAL_TABLET | Freq: Once | ORAL | Status: AC
  Administered 2015-01-30: 1 via ORAL
  Filled 2015-01-30: qty 1

## 2015-01-30 MED ORDER — VANCOMYCIN HCL IN DEXTROSE 1-5 GM/200ML-% IV SOLN
1000.0000 mg | INTRAVENOUS | Status: AC
  Administered 2015-01-31: 1000 mg via INTRAVENOUS
  Filled 2015-01-30: qty 200

## 2015-01-30 MED ORDER — ACETAMINOPHEN 650 MG RE SUPP: 650.0000 mg | Freq: Four times a day (QID) | RECTAL | Status: DC | PRN

## 2015-01-30 MED ORDER — CLINDAMYCIN PHOSPHATE 600 MG/50ML IV SOLN
600.0000 mg | Freq: Once | INTRAVENOUS | Status: AC
  Administered 2015-01-30: 600 mg via INTRAVENOUS
  Filled 2015-01-30: qty 50

## 2015-01-30 NOTE — ED Notes (Signed)
Hospitalist at bedside 

## 2015-01-30 NOTE — Progress Notes (Signed)
VASCULAR LAB PRELIMINARY  PRELIMINARY  PRELIMINARY  PRELIMINARY  Right lower extremity venous duplex completed.    Preliminary report:  Right:  No evidence of DVT, superficial thrombosis, or Baker's cyst.  Jahaira Earnhart, RVS 02/09/2015, 7:24 PM

## 2015-01-30 NOTE — H&P (Addendum)
PCP: Kevan Ny, MD  Oncology [facility]  Referring provider [place]   Chief Complaint:  Toe redenss  HPI: April Kennedy is a 81 y.o. female   has a past medical history of Hypertension; Glaucoma; Chronic kidney disease; Neuromuscular disorder; Diabetes mellitus without complication; Asthma; and Hyperlipidemia.   Presented with  Patient has had ingrown right  toe nail for about 2 weeks. He's been having worsening pain on the right. Was treated with Norco but pain continued to increased. She did not have any fevers no nausea vomiting. He is been having worsening her right leg swelling. She presented to emergency department due to ongoing pain. She was noted to have somewhat elevated blood blood cell contact 12.2. Of note she has been recently on steroids. Plain imaging of the leg shown no air and no fractures. Venous doppler negative for DVT  Chest x-ray was done showing small right pleural effusion with right basilar airspace disease unchanged from prior atelectasis versus pneumonia. Of note no pneumonia was noted on the PET scan every day today  She also noted today that her LEFT toes feeling tingling, burning, with worsening severe pain. She had some discomfort on the left yesterday as well but it was intermittent. In ER unable to get a pulse on the left. Pulse dopplered on the Right toes appeared swollen and slightly dusky but warm. On the Left toes are dusky with bluish  toe nails foot is cool to the touch. Reports loss of sensation to the toes and worsening pain progressive since she has been sitting in ER.  Vascular surgery has been consulted. ER provider spoke to Dr. Scot Dock who will see patient in ER. Vascular study has been ordered.   Patient recently hospitalized for hypoxic failure due to pleural effusion. Undergone thoracentesis of pleural effusion and now sp Pleur-x cath by Dr. Nils Pyle on 01/26/15. Preliminary studies showed adeno carcinoma. PET scan done today showed  hypermetabolic pleural thickening with thin entirety of the right hemithorax concerning transpleural spread of lung carcinoma. Metastatic spread to the lung, mediastinum and left supraclavicular hypermetabolic node. MRI of the brain was done that showed no metastatic spread.   Vernard Gambles that has history of chronic kidney disease and was recently noted to have acute renal failure  During her hospitalization her troponin was noted to be elevated which was thought to be due to demand ischemia today troponin Down to 0.03  Of note on chart patient has hx  Of DM but states have been taken off her meds and have been doing fine. She is unsure if she is diabetic.    Hospitalist was called for admission for right leg cellulitis and left leg cyanosis  Review of Systems:    Pertinent positives include: Bilateral leg, toe pain,   Constitutional:  No weight loss, night sweats, Fevers, chills, fatigue, weight loss  HEENT:  No headaches, Difficulty swallowing,Tooth/dental problems,Sore throat,  No sneezing, itching, ear ache, nasal congestion, post nasal drip,  Cardio-vascular:  No chest pain, Orthopnea, PND, anasarca, dizziness, palpitations.no Bilateral lower extremity swelling  GI:  No heartburn, indigestion, abdominal pain, nausea, vomiting, diarrhea, change in bowel habits, loss of appetite, melena, blood in stool, hematemesis Resp:  no shortness of breath at rest. No dyspnea on exertion, No excess mucus, no productive cough, No non-productive cough, No coughing up of blood.No change in color of mucus.No wheezing. Skin:  no rash or lesions. No jaundice GU:  no dysuria, change in color of urine, no urgency or frequency.  No straining to urinate.  No flank pain.  Musculoskeletal:  No joint pain or no joint swelling. No decreased range of motion. No back pain.  Psych:  No change in mood or affect. No depression or anxiety. No memory loss.  Neuro: no localizing neurological complaints, no tingling, no  weakness, no double vision, no gait abnormality, no slurred speech, no confusion  Otherwise ROS are negative except for above, 10 systems were reviewed  Past Medical History: Past Medical History  Diagnosis Date  . Hypertension   . Glaucoma   . Chronic kidney disease   . Neuromuscular disorder   . Diabetes mellitus without complication   . Asthma   . Hyperlipidemia    Past Surgical History  Procedure Laterality Date  . Chest tube insertion Right 01/26/2015    Procedure: INSERTION RIGHT PLEURAL DRAINAGE CATHETER;  Surgeon: Ivin Poot, MD;  Location: [facility] OR;  Service: Thoracic;  Laterality: Right;     Medications: Prior to Admission medications   Medication Sig Start Date End Date Taking? Authorizing Provider  amLODipine (NORVASC) 10 MG tablet Take 1 tablet (10 mg total) by mouth daily. 01/27/15  Yes Nita Sells, MD  bimatoprost (LUMIGAN) 0.01 % SOLN Place 1 drop into both eyes at bedtime.    Yes Historical Provider, MD  brimonidine (ALPHAGAN P) 0.1 % SOLN Place 1 drop into both eyes 3 (three) times daily.    Yes Historical Provider, MD  dorzolamide (TRUSOPT) 2 % ophthalmic solution Place 1 drop into the right eye 3 (three) times daily. 01/01/15  Yes Historical Provider, MD  HYDROcodone-acetaminophen (NORCO/VICODIN) 5-325 MG per tablet Take 1 tablet by mouth every 4 (four) hours as needed for moderate pain or severe pain. 01/27/15  Yes Nita Sells, MD  Multiple Vitamin (MULTIVITAMIN) tablet Take 1 tablet by mouth daily.   Yes Historical Provider, MD  PROAIR HFA 108 (90 BASE) MCG/ACT inhaler Inhale 1-2 puffs into the lungs every 6 (six) hours as needed for wheezing or shortness of breath.  01/12/15  Yes Historical Provider, MD    Allergies:   Allergies  Allergen Reactions  . Sulfa Antibiotics     Hives/ itching     Social History:  Ambulatory   Independently  Lives at home alone but family close by     reports that she has quit smoking. She does not have  any smokeless tobacco history on file. She reports that she does not drink alcohol or use illicit drugs.    Family History: family history includes Hypertension in an other family member.    Physical Exam: Patient Vitals for the past 24 hrs:  BP Temp Temp src Pulse Resp SpO2  02/08/2015 2000 (!) 137/51 mmHg - - 110 25 91 %  02/08/2015 1954 151/56 mmHg - - 108 22 92 %  02/11/2015 1830 159/75 mmHg - - 102 26 94 %  01/16/2015 1703 151/64 mmHg 98.3 F (36.8 C) Oral 110 16 99 %    1. General:  in No Acute distress 2. Psychological: Alert and Oriented 3. Head/ENT:   Moist   Mucous Membranes                          Head Non traumatic, neck supple                          Normal   Dentition 4. SKIN:   decreased Skin turgor,  Skin clean Dry  red right toe swelling noted 5. Heart: Regular rate and rhythm no Murmur, Rub or gallop 6. Lungs:  no wheezes or crackles    decreased breath sounds on the right 7. Abdomen: Soft, non-tender, Non distended 8. Lower extremities: no clubbing, + cyanosis left worse that right, 1+ edema on the right only 9. Neurologically Grossly intact, moving all 4 extremities equally 10. MSK: Normal range of motion  body mass index is unknown because there is no weight on file.   Labs on Admission:   Results for orders placed or performed during the hospital encounter of 02/15/2015 (from the past 24 hour(s))  CBC with Differential     Status: Abnormal   Collection Time: 01/27/2015  5:00 PM  Result Value Ref Range   WBC 12.2 (H) 4.0 - 10.5 K/uL   RBC 4.84 3.87 - 5.11 MIL/uL   Hemoglobin 12.9 12.0 - 15.0 g/dL   HCT 38.0 36.0 - 46.0 %   MCV 78.5 78.0 - 100.0 fL   MCH 26.7 26.0 - 34.0 pg   MCHC 33.9 30.0 - 36.0 g/dL   RDW 14.2 11.5 - 15.5 %   Platelets 127 (L) 150 - 400 K/uL   Neutrophils Relative % 74 43 - 77 %   Neutro Abs 9.0 (H) 1.7 - 7.7 K/uL   Lymphocytes Relative 14 12 - 46 %   Lymphs Abs 1.8 0.7 - 4.0 K/uL   Monocytes Relative 10 3 - 12 %   Monocytes Absolute  1.2 (H) 0.1 - 1.0 K/uL   Eosinophils Relative 2 0 - 5 %   Eosinophils Absolute 0.2 0.0 - 0.7 K/uL   Basophils Relative 0 0 - 1 %   Basophils Absolute 0.0 0.0 - 0.1 K/uL  Comprehensive metabolic panel     Status: Abnormal   Collection Time: 02/13/2015  5:00 PM  Result Value Ref Range   Sodium 138 135 - 145 mmol/L   Potassium 4.1 3.5 - 5.1 mmol/L   Chloride 102 101 - 111 mmol/L   CO2 24 22 - 32 mmol/L   Glucose, Bld 153 (H) 65 - 99 mg/dL   BUN 19 6 - 20 mg/dL   Creatinine, Ser 1.86 (H) 0.44 - 1.00 mg/dL   Calcium 8.6 (L) 8.9 - 10.3 mg/dL   Total Protein 6.7 6.5 - 8.1 g/dL   Albumin 2.9 (L) 3.5 - 5.0 g/dL   AST 33 15 - 41 U/L   ALT 33 14 - 54 U/L   Alkaline Phosphatase 59 38 - 126 U/L   Total Bilirubin 0.7 0.3 - 1.2 mg/dL   GFR calc non Af Amer 25 (L) >60 mL/min   GFR calc Af Amer 29 (L) >60 mL/min   Anion gap 12 5 - 15  I-stat troponin, ED     Status: None   Collection Time: 02/15/2015  5:24 PM  Result Value Ref Range   Troponin i, poc 0.03 0.00 - 0.08 ng/mL   Comment 3          I-Stat CG4 Lactic Acid, ED     Status: None   Collection Time: 01/26/2015  8:33 PM  Result Value Ref Range   Lactic Acid, Venous 1.71 0.5 - 2.0 mmol/L    UA not obtained  Lab Results  Component Value Date   HGBA1C 5.9 11/10/2011    Estimated Creatinine Clearance: 25.9 mL/min (by C-G formula based on Cr of 1.86).  BNP (last 3 results) No results for input(s): PROBNP in the last 8760 hours.  Other results:  I have pearsonaly reviewed this: ECG REPORT  Rate: 107  Rhythm: sinus Tachy  ST&T Change: no ischemic changes  QTC 424  Last echogram done on 01/17/2015 Showing EF of 55-60 percent and grade 1 diastolic dysfunction mildly increased pulmonic artery pressure up to 36    There were no vitals filed for this visit.   Cultures:    Component Value Date/Time   SDES URINE, CLEAN CATCH 01/18/2015 1126   SPECREQUEST NONE 01/18/2015 1126   CULT NO GROWTH 5 DAYS 01/17/2015 1056   REPTSTATUS  01/19/2015 FINAL 01/18/2015 1126     Radiological Exams on Admission: Dg Chest 2 View  02/01/2015   CLINICAL DATA:  Chest pain  EXAM: CHEST  2 VIEW  COMPARISON:  PET-CT 01/27/2015, chest x-ray 01/27/2015  FINDINGS: There is a partially loculated right lower lobe small pleural effusion. There is a small left pleural effusion. There is right lower lung airspace disease unchanged compared with 01/27/2015. There is mild right lung interstitial thickening. There is a right-sided PleurX catheter present.  There is no pneumothorax. The heart size is stable. There is right hilar lymphadenopathy.  There is no acute osseous abnormality.  IMPRESSION: Small right pleural effusion with right basilar airspace disease unchanged compared with 01/27/2015. This may reflect atelectasis versus pneumonia.   Electronically Signed   By: Kathreen Devoid   On: 01/29/2015 17:58   Mr Brain W Wo Contrast  02/01/2015   CLINICAL DATA:  Lung cancer with recurrent malignant right pleural effusion. Staging.  EXAM: MRI HEAD WITHOUT AND WITH CONTRAST  TECHNIQUE: Multiplanar, multiecho pulse sequences of the brain and surrounding structures were obtained without and with intravenous contrast.  CONTRAST:  87m MULTIHANCE GADOBENATE DIMEGLUMINE 529 MG/ML IV SOLN  COMPARISON:  None.  FINDINGS: No acute infarct, hemorrhage, or mass lesion is present. Minimal periventricular and subcortical T2 changes bilaterally are within normal limits for age. The ventricles are of normal size. No significant extraaxial fluid collection is present.  Flow is present in the major intracranial arteries. A right lens replacement is noted. The globes and orbits are otherwise intact. The paranasal sinuses and mastoid air cells are clear.  The postcontrast images demonstrate no pathologic enhancement.  Skullbase is within normal limits. Midline images demonstrate degenerative changes in the upper cervical spine. A relatively empty sella is present. No focal lesions are  evident.  IMPRESSION: 1. Normal MRI appearance the brain for age. No evidence for metastatic disease the brain or meninges. 2. Mild to moderate degenerative changes within the upper cervical spine.   Electronically Signed   By: CSan MorelleM.D.   On: 01/23/2015 13:00   Nm Pet Image Initial (pi) Skull Base To Thigh  01/25/2015   CLINICAL DATA:  Initial treatment strategy for lung adenocarcinoma.  EXAM: NUCLEAR MEDICINE PET SKULL BASE TO THIGH  TECHNIQUE: 9.0 mCi F-18 FDG was injected intravenously. Full-ring PET imaging was performed from the skull base to thigh after the radiotracer. CT data was obtained and used for attenuation correction and anatomic localization.  FASTING BLOOD GLUCOSE:  Value: 170 mg/dl  COMPARISON:  CT 01/18/2015  FINDINGS: NECK  Hypermetabolic left supraclavicular lymph node measures 14 mm short axis on image 45 CT data set. This left supraclavicular node is intensely metabolic with SUV max equals 7.9.  CHEST  There is a rind of intensely hypermetabolic pleural thickening within the right hemi thorax. This hypermetabolic thickening over the diaphragm is intense with SUV max equal 8.1. Hypermetabolic thickening extends  medially within the right hemi thorax and the right hilum. This hypermetabolic thickening appears to be on the parietal and pleural surface.  There are discrete nodules within the right lower lobe which are not as metabolic as the pleural thickening. Largest nodule measures 29 mm on image 78, series 4.  Extensive hypermetabolic mediastinal lymphadenopathy. A precarinal lymph node measures 17 mm with SUV max equals 6.8. Subcarinal lymph node measures 30 mm would equal intensity. Prevascular lymph nodes also noted.  ABDOMEN/PELVIS  No abnormal metabolic activity within the liver. Mild activity associated with the left adrenal gland similar to liver activity and therefore likely benign. No measurable lesion. No hypermetabolic abdominal pelvic adenopathy.  SKELETON  No  focal hypermetabolic activity to suggest skeletal metastasis.  IMPRESSION: 1. Rind of intensely hypermetabolic pleural thickening within the entirety of the right hemi thorax is concerning for trans pleural spread of lung carcinoma. 2. No clear primary lesion identified within the right lung. Nodules in the inferior right lower lobe and right middle lobe are not significant metabolic compared to the pleural thickening. 3. Extensive hypermetabolic nodal metastasis within the mediastinum. 4. Contralateral left super clavicular hypermetabolic nodal metastasis. 5. No evidence metastasis below the diaphragm. No skeletal metastasis   Electronically Signed   By: Suzy Bouchard M.D.   On: 01/16/2015 09:08   Dg Foot Complete Right  01/22/2015   CLINICAL DATA:  Pain  EXAM: RIGHT FOOT COMPLETE - 3+ VIEW  COMPARISON:  None.  FINDINGS: Three views of the right foot submitted. There is diffuse osteopenia. Mild hallux valgus deformity. Degenerative changes first metatarsal phalangeal joint. Mild degenerative changes distal aspect first metatarsal. Dorsal spurring metatarsal region.  IMPRESSION: No acute fracture or subluxation. Mild hallux valgus deformity. Degenerative changes first metatarsal phalangeal joint and distal aspect of first metatarsal. Mild soft tissue swelling dorsal metatarsal region.   Electronically Signed   By: Lahoma Crocker M.D.   On: 02/10/2015 17:55   Mm Diag Breast Tomo Bilateral  01/29/2015   CLINICAL DATA:  81 year old female with recently diagnosed adenocarcinoma with a malignant right pleural effusion. No breast complaints today.  EXAM: DIGITAL DIAGNOSTIC BILATERAL MAMMOGRAM WITH 3D TOMOSYNTHESIS AND CAD  COMPARISON:  Previous exam(s).  ACR Breast Density Category c: The breast tissue is heterogeneously dense, which may obscure small masses.  FINDINGS: No suspicious masses or calcifications are seen in either breast. There is no mammographic evidence of malignancy in either breast.  Mammographic  images were processed with CAD.  IMPRESSION: No mammographic evidence of malignancy in either breast.  RECOMMENDATION: Screening mammogram in one year.(Code:[id number]-01Y)  I have discussed the findings and recommendations with the patient. Results were also provided in writing at the conclusion of the visit. If applicable, a reminder letter will be sent to the patient regarding the next appointment.  BI-RADS CATEGORY  1: Negative.   Electronically Signed   By: Everlean Alstrom M.D.   On: 01/29/2015 13:33    Chart has been reviewed  Family   at  Bedside  plan of care was discussed with  Daughter Fatima Sanger [phone]  Assessment/Plan  81 yo F with hx of malignant pleural effusion due to newly diagnosed malignancy pathology is pending, HTN, diet controlled DM, CKD, chronic diastolic CHF here with right toe pain due to ingrown toe nail and cellulitis as well as Left foot pain worrisome for poor perfusion. Vascular surgery is aware  Present on Admission:  . Malignant pleural effusion sp Pleuro-X catheter, pathology pending, Spoke to  Oncology  Dr. Drue Novel  To let him know that patient is being admitted.  . Essential hypertension - hold Norvasc for now allow permissive HTN incase there is ischemia of extremities . Chronic kidney disease, stage 3 -stable improving will avoid reno toxic drugs and contrast.  . right leg cellulitis   - for now given recent hospitalization, DM, and possibly ischemia will  treat with broad spectrum antibiotics . Foot pain, left - worrisome for possible ischemia, recent ECHO with no source of emboli. Will order arterial doppler.  Vascular surgery consult has been called will see patient in ER. If worrisome for embolic disease will start on heparin DM - will obtain Hg A1C, for now on sliding scale    Prophylaxis:   Lovenox   CODE STATUS:   DNR/DNI as per patient    Disposition:   To home once workup is complete and patient is stable  Other plan as per  orders.  I have spent a total of 75 min on this admission was taken due to complexity of medical decision making as well as to discuss case with oncology.   [place] 02/14/2015, 8:42 PM  [facility]  Pager [phone]   after 2 AM please page floor coverage PA If 7AM-7PM, please contact the day team taking care of the patient  [email or web address]  Password [email or web address]

## 2015-01-30 NOTE — ED Notes (Signed)
Transporting patient to new room assignment. 

## 2015-01-30 NOTE — ED Notes (Signed)
The patient sais she went to [facility] and she was sent here to the ED.  The patient denies any injury to the toe but says she is in pain.  She said she started having pain and swelling.  The toe is red, bruised and swelling.  She rates her pain 10/10.  She was in the hospital for fluid in her lungs.  They placed a drain and sent her home.  She says her toe has been hurting her since she was in the hospital but it was not addressed then.

## 2015-01-30 NOTE — Telephone Encounter (Signed)
Order faxed to [facility] for Pleurx supplies.  Sent to scan.

## 2015-01-30 NOTE — ED Notes (Signed)
C/o toe pain States right toe nail is in bedded into toe

## 2015-01-30 NOTE — ED Provider Notes (Signed)
CSN: [id number]     Arrival date & time 01/23/2015  1505 History   First MD Initiated Contact with Patient 01/25/2015 1532     Chief Complaint  Patient presents with  . Toe Pain   (Consider location/radiation/quality/duration/timing/severity/associated sxs/prior Treatment) HPI  R toe pain. Started 2 wks ago. Constant. Getting worse. Pain pills w/o benefit. No drainage. No injury. Denies fevers, n/v/ rash, CP, joint pain outside the toe.  Other toes are also now becoming painful. Right lower extremity swelling. The patient has had multiple recent hospital admissions due to pleural effusions and has a Pleurx catheter in place. Drains a couple 100 mL every couple of days.  Past Medical History  Diagnosis Date  . Hypertension   . Glaucoma   . Chronic kidney disease   . Neuromuscular disorder   . Diabetes mellitus without complication   . Asthma   . Hyperlipidemia    Past Surgical History  Procedure Laterality Date  . Chest tube insertion Right 01/26/2015    Procedure: INSERTION RIGHT PLEURAL DRAINAGE CATHETER;  Surgeon: Ivin Poot, MD;  Location: [facility] OR;  Service: Thoracic;  Laterality: Right;   Family History  Problem Relation Age of Onset  . Hypertension     History  Substance Use Topics  . Smoking status: Former Research scientist (life sciences)  . Smokeless tobacco: Not on file  . Alcohol Use: No   OB History    No data available     Review of Systems Per HPI with all other pertinent systems negative.   Allergies  Sulfa antibiotics  Home Medications   Prior to Admission medications   Medication Sig Start Date End Date Taking? Authorizing Provider  amLODipine (NORVASC) 10 MG tablet Take 1 tablet (10 mg total) by mouth daily. 01/27/15   Nita Sells, MD  bimatoprost (LUMIGAN) 0.01 % SOLN Place 1 drop into both eyes at bedtime.     Historical Provider, MD  brimonidine (ALPHAGAN P) 0.1 % SOLN Place 1 drop into both eyes 3 (three) times daily.     Historical Provider, MD   HYDROcodone-acetaminophen (NORCO/VICODIN) 5-325 MG per tablet Take 1 tablet by mouth every 4 (four) hours as needed for moderate pain or severe pain. 01/27/15   Nita Sells, MD  Multiple Vitamin (MULTIVITAMIN) tablet Take 1 tablet by mouth daily.    Historical Provider, MD   BP 133/59 mmHg  Pulse 107  Temp(Src) 98.5 F (36.9 C) (Oral)  Resp 14  SpO2 95% Physical Exam Physical Exam  Constitutional: oriented to person, place, and time. appears well-developed and well-nourished. No distress.  HENT:  Head: Normocephalic and atraumatic.  Eyes: EOMI. PERRL.  Neck: Normal range of motion.  Cardiovascular: RRR, no m/r/g, 2+ distal pulses, 2+ pitting edema of the right lower extremity from the ankle to the knee. Pulmonary/Chest: Effort normal and breath sounds normal. No respiratory distress.  decreased breath sounds of the right middle and lower lobes. Abdominal: Soft. Bowel sounds are normal. NonTTP, no distension.  Musculoskeletal: Normal range of motion. Non ttp, no effusion.  Neurological: alert and oriented to person, place, and time.  Skin: Right foot with areas of erythema and induration around the medial midfoot extending to the end of the great toe and along the plantar surface of the MTPs. Digits 1 through 4 tender to palpation, mottled appearance of the skin of the right great toe.  severely onychomycotic toes bilaterally  Psychiatric: normal mood and affect. behavior is normal. Judgment and thought content normal.   ED Course  Procedures (including critical care time) Labs Review Labs Reviewed - No data to display  Imaging Review Mr Jeri Cos Wo Contrast  02/06/2015   CLINICAL DATA:  Lung cancer with recurrent malignant right pleural effusion. Staging.  EXAM: MRI HEAD WITHOUT AND WITH CONTRAST  TECHNIQUE: Multiplanar, multiecho pulse sequences of the brain and surrounding structures were obtained without and with intravenous contrast.  CONTRAST:  60m MULTIHANCE GADOBENATE  DIMEGLUMINE 529 MG/ML IV SOLN  COMPARISON:  None.  FINDINGS: No acute infarct, hemorrhage, or mass lesion is present. Minimal periventricular and subcortical T2 changes bilaterally are within normal limits for age. The ventricles are of normal size. No significant extraaxial fluid collection is present.  Flow is present in the major intracranial arteries. A right lens replacement is noted. The globes and orbits are otherwise intact. The paranasal sinuses and mastoid air cells are clear.  The postcontrast images demonstrate no pathologic enhancement.  Skullbase is within normal limits. Midline images demonstrate degenerative changes in the upper cervical spine. A relatively empty sella is present. No focal lesions are evident.  IMPRESSION: 1. Normal MRI appearance the brain for age. No evidence for metastatic disease the brain or meninges. 2. Mild to moderate degenerative changes within the upper cervical spine.   Electronically Signed   By: CSan MorelleM.D.   On: 02/01/2015 13:00   Nm Pet Image Initial (pi) Skull Base To Thigh  02/06/2015   CLINICAL DATA:  Initial treatment strategy for lung adenocarcinoma.  EXAM: NUCLEAR MEDICINE PET SKULL BASE TO THIGH  TECHNIQUE: 9.0 mCi F-18 FDG was injected intravenously. Full-ring PET imaging was performed from the skull base to thigh after the radiotracer. CT data was obtained and used for attenuation correction and anatomic localization.  FASTING BLOOD GLUCOSE:  Value: 170 mg/dl  COMPARISON:  CT 01/18/2015  FINDINGS: NECK  Hypermetabolic left supraclavicular lymph node measures 14 mm short axis on image 45 CT data set. This left supraclavicular node is intensely metabolic with SUV max equals 7.9.  CHEST  There is a rind of intensely hypermetabolic pleural thickening within the right hemi thorax. This hypermetabolic thickening over the diaphragm is intense with SUV max equal 8.1. Hypermetabolic thickening extends medially within the right hemi thorax and the right  hilum. This hypermetabolic thickening appears to be on the parietal and pleural surface.  There are discrete nodules within the right lower lobe which are not as metabolic as the pleural thickening. Largest nodule measures 29 mm on image 78, series 4.  Extensive hypermetabolic mediastinal lymphadenopathy. A precarinal lymph node measures 17 mm with SUV max equals 6.8. Subcarinal lymph node measures 30 mm would equal intensity. Prevascular lymph nodes also noted.  ABDOMEN/PELVIS  No abnormal metabolic activity within the liver. Mild activity associated with the left adrenal gland similar to liver activity and therefore likely benign. No measurable lesion. No hypermetabolic abdominal pelvic adenopathy.  SKELETON  No focal hypermetabolic activity to suggest skeletal metastasis.  IMPRESSION: 1. Rind of intensely hypermetabolic pleural thickening within the entirety of the right hemi thorax is concerning for trans pleural spread of lung carcinoma. 2. No clear primary lesion identified within the right lung. Nodules in the inferior right lower lobe and right middle lobe are not significant metabolic compared to the pleural thickening. 3. Extensive hypermetabolic nodal metastasis within the mediastinum. 4. Contralateral left super clavicular hypermetabolic nodal metastasis. 5. No evidence metastasis below the diaphragm. No skeletal metastasis   Electronically Signed   By: SSuzy BouchardM.D.   On:  01/25/2015 09:08   Mm Diag Breast Tomo Bilateral  01/29/2015   CLINICAL DATA:  81 year old female with recently diagnosed adenocarcinoma with a malignant right pleural effusion. No breast complaints today.  EXAM: DIGITAL DIAGNOSTIC BILATERAL MAMMOGRAM WITH 3D TOMOSYNTHESIS AND CAD  COMPARISON:  Previous exam(s).  ACR Breast Density Category c: The breast tissue is heterogeneously dense, which may obscure small masses.  FINDINGS: No suspicious masses or calcifications are seen in either breast. There is no mammographic  evidence of malignancy in either breast.  Mammographic images were processed with CAD.  IMPRESSION: No mammographic evidence of malignancy in either breast.  RECOMMENDATION: Screening mammogram in one year.(Code:[id number]-B-01Y)  I have discussed the findings and recommendations with the patient. Results were also provided in writing at the conclusion of the visit. If applicable, a reminder letter will be sent to the patient regarding the next appointment.  BI-RADS CATEGORY  1: Negative.   Electronically Signed   By: Everlean Alstrom M.D.   On: 01/29/2015 13:33     MDM   1. Toe pain, right   2. Swelling of right lower extremity    Patient with numerous severe chronic medical conditions and with ongoing workup for recurrent pleural effusion. Greatest concern is malignancy. Patient drinks several 100 mL of fluid from her Pleurx catheter every couple of days. Of note patient's presentation today may be as benign as gout in her right great toe but there is concern for possible cellulitis given mottled appearance of toe and surrounding pain. Additionally right lower extremity is concerning for DVT especially given likely diagnoses of cancer based off of lung workup and recent sedentary lifestyle. Swelling of the right lower extremity may simply be due to inflammation from possible cellulitis of the right foot. Forcep patient without systemic symptoms at this point time. I feel the patient would warrant further workup in the emergency room and possible discharge home versus admission. Family and patient agreeable with this plan.    Waldemar Dickens, MD 02/11/2015 [phone]

## 2015-01-30 NOTE — Consult Note (Signed)
[facility] [place]  Patient name: April Kennedy MRN: [id number] DOB: 04/14/1934 Sex: female  REASON FOR CONSULT: Left foot pain. Consult is from the emergency department.  HPI: April Kennedy is a 81 y.o. female who presented to the emergency department today because of swelling in her right foot and an ingrown toenail in her right foot. While she has been in the emergency department she noted some pain in the left foot. Upon further questioning this pain began gradually this morning and has been persistent. The emergency room physician was unable to obtain a Doppler signal in the left foot and for this reason vascular surgery was consult.  The patient does admit to some mild left calf claudication which has been chronic. She denies any history of rest pain or history of nonhealing ulcers. She is unaware of any previous history of DVT.  Her risk factors for vascular disease include hypertension, borderline diabetes, hyperlipidemia, and a history of tobacco use.  Of note the patient has a history of lung cancer with a recurrent malignant right pleural effusion.  Past Medical History  Diagnosis Date  . Hypertension   . Glaucoma   . Chronic kidney disease   . Neuromuscular disorder   . Diabetes mellitus without complication   . Asthma   . Hyperlipidemia     Family History  Problem Relation Age of Onset  . Hypertension    She denies any family history of premature cardiovascular disease.  SOCIAL HISTORY: History  Substance Use Topics  . Smoking status: Former Research scientist (life sciences)  . Smokeless tobacco: Not on file  . Alcohol Use: No  She quit smoking 2 years ago. She states that prior to that she smoked less than a pack per day.  Allergies  Allergen Reactions  . Sulfa Antibiotics     Hives/ itching     No current facility-administered medications for this encounter.   Current Outpatient Prescriptions  Medication Sig Dispense Refill  . amLODipine (NORVASC)  10 MG tablet Take 1 tablet (10 mg total) by mouth daily. 30 tablet 0  . bimatoprost (LUMIGAN) 0.01 % SOLN Place 1 drop into both eyes at bedtime.     . brimonidine (ALPHAGAN P) 0.1 % SOLN Place 1 drop into both eyes 3 (three) times daily.     . dorzolamide (TRUSOPT) 2 % ophthalmic solution Place 1 drop into the right eye 3 (three) times daily.  12  . HYDROcodone-acetaminophen (NORCO/VICODIN) 5-325 MG per tablet Take 1 tablet by mouth every 4 (four) hours as needed for moderate pain or severe pain. 30 tablet 0  . Multiple Vitamin (MULTIVITAMIN) tablet Take 1 tablet by mouth daily.    Marland Kitchen PROAIR HFA 108 (90 BASE) MCG/ACT inhaler Inhale 1-2 puffs into the lungs every 6 (six) hours as needed for wheezing or shortness of breath.   3    REVIEW OF SYSTEMS: Valu.Nieves ] denotes positive finding; [  ] denotes negative finding CARDIOVASCULAR:  '[ ]'$  chest pain   Valu.Nieves ] chest pressure   '[ ]'$  palpitations   Valu.Nieves ] orthopnea   Valu.Nieves ] dyspnea on exertion   Valu.Nieves ] claudication   '[ ]'$  rest pain   '[ ]'$  DVT   '[ ]'$  phlebitis PULMONARY:   '[ ]'$  productive cough   '[ ]'$  asthma   '[ ]'$  wheezing NEUROLOGIC:   '[ ]'$  weakness  '[ ]'$  paresthesias  '[ ]'$  aphasia  '[ ]'$  amaurosis  '[ ]'$  dizziness HEMATOLOGIC:   '[ ]'$   bleeding problems   '[ ]'$  clotting disorders MUSCULOSKELETAL:  Valu.Nieves ] joint pain   '[ ]'$  joint swelling '[ ]'$  leg swelling GASTROINTESTINAL: '[ ]'$   blood in stool  '[ ]'$   hematemesis GENITOURINARY:  '[ ]'$   dysuria  '[ ]'$   hematuria PSYCHIATRIC:  '[ ]'$  history of major depression INTEGUMENTARY:  '[ ]'$  rashes  '[ ]'$  ulcers CONSTITUTIONAL:  '[ ]'$  fever   '[ ]'$  chills  PHYSICAL EXAM: Filed Vitals:   02/01/2015 2000 01/16/2015 2045 01/28/2015 2127 02/08/2015 2130  BP: 137/51 147/58 158/63 159/64  Pulse: 110 110 110 110  Temp:      TempSrc:      Resp: 25  16   SpO2: 91% 90% 90% 91%   There is no weight on file to calculate BMI. GENERAL: The patient is a well-nourished female, in no acute distress. The vital signs are documented above. CARDIOVASCULAR: There is a regular rate and  rhythm. I do not detect carotid bruits. She has a palpable but diminished right femoral pulse. I cannot palpate a left femoral pulse. She has a monophasic but brisk right dorsalis pedis and posterior tibial signal with the Doppler. On the left side, which is the site of concern, she has a monophasic anterior tibial signal and a dampened monophasic peroneal signal. I am unable to obtain a posterior tibial signal or dorsalis pedis signal. The foot appears adequately perfused. The right foot is swollen.  PULMONARY: She has decrease breath sounds at both bases. ABDOMEN: Soft and non-tender with normal pitched bowel sounds.  MUSCULOSKELETAL: There are no major deformities or cyanosis. NEUROLOGIC: No focal weakness or paresthesias are detected. SKIN: There are no ulcers or rashes noted. She does have some slight discoloration of her right great toe. PSYCHIATRIC: The patient has a normal affect.  DATA:  Lab Results  Component Value Date   WBC 12.2* 02/09/2015   HGB 12.9 02/14/2015   HCT 38.0 02/10/2015   MCV 78.5 02/01/2015   PLT 127* 02/04/2015   Lab Results  Component Value Date   NA 138 02/09/2015   K 4.1 01/23/2015   CL 102 02/05/2015   CO2 24 02/06/2015   Lab Results  Component Value Date   CREATININE 1.86* 02/13/2015   Lab Results  Component Value Date   INR 1.18 01/26/2015   INR 1.18 01/18/2015   Lab Results  Component Value Date   HGBA1C 5.9 11/10/2011   CBG (last 3)   Recent Labs  01/19/2015 0708  GLUCAP 170*   VENOUS DUPLEX RIGHT LOWER EXTREMITY: I have independently interpreted her venous duplex scan which was performed today and shows no evidence of DVT of the right lower extremity.  MEDICAL ISSUES:  CHRONIC MULTILEVEL ARTERIAL OCCLUSIVE DISEASE: Based on her exam and history she has evidence of chronic multilevel arterial occlusive disease. I do not think that she has had an acute arterial event. She does have Doppler flow in the left lower extremity in the  anterior tibial and peroneal positions. If her symptoms progress then we could consider arteriography. Her creatinine is 1.86 and this would likely have to be done with CO2 unless her creatinine improved with hydration. If the patient is admitted I will follow her during this admission. If the patient is discharged, I will arrange to see her in the outpatient heading for close follow up of her left lower extremity. If her symptoms progressed and we would consider arteriography.  Deitra Mayo [facility] [place]: [phone]

## 2015-01-30 NOTE — Discharge Instructions (Signed)
You need a more thorough workup for your current medical conditions. Please go to the emergency room.

## 2015-01-30 NOTE — ED Notes (Signed)
Vascular surgery at bedside.

## 2015-01-30 NOTE — ED Provider Notes (Addendum)
CSN: [id number]     Arrival date & time 01/22/2015  1634 History   First MD Initiated Contact with Patient 02/01/2015 1747     Chief Complaint  Patient presents with  . Toe Pain    The patient sais she went to [facility] and she was sent here to the ED.  The patient denies any injury to the toe but says she is in pain.  She said she started having pain and swelling.     (Consider location/radiation/quality/duration/timing/severity/associated sxs/prior Treatment) HPI Comments: Patient presents with right big toe pain. She states it's been hurting for about 10 days. She does have a history of severely onychomycotic toes with ingrown toenails. She has an upcoming appointment with podiatry. She states over last 2 days her leg is been swollen on the right. She's also had increased pain and red grey discoloration of the right toe. She has a history of a recent admission for a pleural effusion. She has a Pleur-evac drain in place. She has a home health nurse that comes and drains it. She has shortness of breath but she says is unchanged from her hospital discharge. She denies any increased pain to the area. She denies any fevers or chills. She denies any nausea or vomiting.  Patient is a 81 y.o. female presenting with toe pain.  Toe Pain Associated symptoms include shortness of breath. Pertinent negatives include no chest pain, no abdominal pain and no headaches.    Past Medical History  Diagnosis Date  . Hypertension   . Glaucoma   . Chronic kidney disease   . Neuromuscular disorder   . Diabetes mellitus without complication   . Asthma   . Hyperlipidemia    Past Surgical History  Procedure Laterality Date  . Chest tube insertion Right 01/26/2015    Procedure: INSERTION RIGHT PLEURAL DRAINAGE CATHETER;  Surgeon: Ivin Poot, MD;  Location: [facility] OR;  Service: Thoracic;  Laterality: Right;   Family History  Problem Relation Age of Onset  . Hypertension     History  Substance Use Topics  . Smoking  status: Former Research scientist (life sciences)  . Smokeless tobacco: Not on file  . Alcohol Use: No   OB History    No data available     Review of Systems  Constitutional: Negative for fever, chills, diaphoresis and fatigue.  HENT: Negative for congestion, rhinorrhea and sneezing.   Eyes: Negative.   Respiratory: Positive for shortness of breath. Negative for cough and chest tightness.   Cardiovascular: Negative for chest pain and leg swelling.  Gastrointestinal: Negative for nausea, vomiting, abdominal pain, diarrhea and blood in stool.  Genitourinary: Negative for frequency, hematuria, flank pain and difficulty urinating.  Musculoskeletal: Positive for arthralgias. Negative for back pain.  Skin: Positive for color change. Negative for rash.  Neurological: Negative for dizziness, speech difficulty, weakness, numbness and headaches.      Allergies  Sulfa antibiotics  Home Medications   Prior to Admission medications   Medication Sig Start Date End Date Taking? Authorizing Provider  amLODipine (NORVASC) 10 MG tablet Take 1 tablet (10 mg total) by mouth daily. 01/27/15  Yes Nita Sells, MD  bimatoprost (LUMIGAN) 0.01 % SOLN Place 1 drop into both eyes at bedtime.    Yes Historical Provider, MD  brimonidine (ALPHAGAN P) 0.1 % SOLN Place 1 drop into both eyes 3 (three) times daily.    Yes Historical Provider, MD  dorzolamide (TRUSOPT) 2 % ophthalmic solution Place 1 drop into the right eye 3 (three) times  daily. 01/01/15  Yes Historical Provider, MD  HYDROcodone-acetaminophen (NORCO/VICODIN) 5-325 MG per tablet Take 1 tablet by mouth every 4 (four) hours as needed for moderate pain or severe pain. 01/27/15  Yes Nita Sells, MD  Multiple Vitamin (MULTIVITAMIN) tablet Take 1 tablet by mouth daily.   Yes Historical Provider, MD  PROAIR HFA 108 (90 BASE) MCG/ACT inhaler Inhale 1-2 puffs into the lungs every 6 (six) hours as needed for wheezing or shortness of breath.  01/12/15  Yes Historical  Provider, MD   BP 137/51 mmHg  Pulse 110  Temp(Src) 98.3 F (36.8 C) (Oral)  Resp 25  SpO2 91% Physical Exam  Constitutional: She is oriented to person, place, and time. She appears well-developed and well-nourished.  HENT:  Head: Normocephalic and atraumatic.  Eyes: Pupils are equal, round, and reactive to light.  Neck: Normal range of motion. Neck supple.  Cardiovascular: Normal rate, regular rhythm and normal heart sounds.   Pulmonary/Chest: Effort normal and breath sounds normal. No respiratory distress. She has no wheezes. She has no rales. She exhibits no tenderness.  She has a right pleural drain in place which is covered by gauze.  Abdominal: Soft. Bowel sounds are normal. There is no tenderness. There is no rebound and no guarding.  Musculoskeletal: Normal range of motion. She exhibits no edema.  She has 3+ pitting edema of the right lower extremity up to the knee. She has red discoloration of the right big toe down to the mid foot. She has marked tenderness to the big toe. She has some grayish discoloration to the end of the toe. There is no drainage. Pedal pulses including dorsalis pedis and posterior tibial are present by Doppler. Interestingly, I was unable to obtain pedal pulses in the left leg. There is some mild duskiness to the tip of the left toes. She has no pain or swelling in the left leg.  Lymphadenopathy:    She has no cervical adenopathy.  Neurological: She is alert and oriented to person, place, and time.  Skin: Skin is warm and dry. No rash noted.  Psychiatric: She has a normal mood and affect.    ED Course  Procedures (including critical care time) Labs Review Labs Reviewed  CBC WITH DIFFERENTIAL/PLATELET - Abnormal; Notable for the following:    WBC 12.2 (*)    Platelets 127 (*)    Neutro Abs 9.0 (*)    Monocytes Absolute 1.2 (*)    All other components within normal limits  COMPREHENSIVE METABOLIC PANEL - Abnormal; Notable for the following:     Glucose, Bld 153 (*)    Creatinine, Ser 1.86 (*)    Calcium 8.6 (*)    Albumin 2.9 (*)    GFR calc non Af Amer 25 (*)    GFR calc Af Amer 29 (*)    All other components within normal limits  I-STAT TROPOININ, ED  I-STAT CG4 LACTIC ACID, ED    Imaging Review Dg Chest 2 View  01/20/2015   CLINICAL DATA:  Chest pain  EXAM: CHEST  2 VIEW  COMPARISON:  PET-CT 01/27/2015, chest x-ray 01/27/2015  FINDINGS: There is a partially loculated right lower lobe small pleural effusion. There is a small left pleural effusion. There is right lower lung airspace disease unchanged compared with 01/27/2015. There is mild right lung interstitial thickening. There is a right-sided PleurX catheter present.  There is no pneumothorax. The heart size is stable. There is right hilar lymphadenopathy.  There is no acute osseous abnormality.  IMPRESSION: Small right pleural effusion with right basilar airspace disease unchanged compared with 01/27/2015. This may reflect atelectasis versus pneumonia.   Electronically Signed   By: Kathreen Devoid   On: 02/09/2015 17:58   Mr Brain W Wo Contrast  01/29/2015   CLINICAL DATA:  Lung cancer with recurrent malignant right pleural effusion. Staging.  EXAM: MRI HEAD WITHOUT AND WITH CONTRAST  TECHNIQUE: Multiplanar, multiecho pulse sequences of the brain and surrounding structures were obtained without and with intravenous contrast.  CONTRAST:  90m MULTIHANCE GADOBENATE DIMEGLUMINE 529 MG/ML IV SOLN  COMPARISON:  None.  FINDINGS: No acute infarct, hemorrhage, or mass lesion is present. Minimal periventricular and subcortical T2 changes bilaterally are within normal limits for age. The ventricles are of normal size. No significant extraaxial fluid collection is present.  Flow is present in the major intracranial arteries. A right lens replacement is noted. The globes and orbits are otherwise intact. The paranasal sinuses and mastoid air cells are clear.  The postcontrast images demonstrate no  pathologic enhancement.  Skullbase is within normal limits. Midline images demonstrate degenerative changes in the upper cervical spine. A relatively empty sella is present. No focal lesions are evident.  IMPRESSION: 1. Normal MRI appearance the brain for age. No evidence for metastatic disease the brain or meninges. 2. Mild to moderate degenerative changes within the upper cervical spine.   Electronically Signed   By: CSan MorelleM.D.   On: 01/28/2015 13:00   Nm Pet Image Initial (pi) Skull Base To Thigh  02/06/2015   CLINICAL DATA:  Initial treatment strategy for lung adenocarcinoma.  EXAM: NUCLEAR MEDICINE PET SKULL BASE TO THIGH  TECHNIQUE: 9.0 mCi F-18 FDG was injected intravenously. Full-ring PET imaging was performed from the skull base to thigh after the radiotracer. CT data was obtained and used for attenuation correction and anatomic localization.  FASTING BLOOD GLUCOSE:  Value: 170 mg/dl  COMPARISON:  CT 01/18/2015  FINDINGS: NECK  Hypermetabolic left supraclavicular lymph node measures 14 mm short axis on image 45 CT data set. This left supraclavicular node is intensely metabolic with SUV max equals 7.9.  CHEST  There is a rind of intensely hypermetabolic pleural thickening within the right hemi thorax. This hypermetabolic thickening over the diaphragm is intense with SUV max equal 8.1. Hypermetabolic thickening extends medially within the right hemi thorax and the right hilum. This hypermetabolic thickening appears to be on the parietal and pleural surface.  There are discrete nodules within the right lower lobe which are not as metabolic as the pleural thickening. Largest nodule measures 29 mm on image 78, series 4.  Extensive hypermetabolic mediastinal lymphadenopathy. A precarinal lymph node measures 17 mm with SUV max equals 6.8. Subcarinal lymph node measures 30 mm would equal intensity. Prevascular lymph nodes also noted.  ABDOMEN/PELVIS  No abnormal metabolic activity within the  liver. Mild activity associated with the left adrenal gland similar to liver activity and therefore likely benign. No measurable lesion. No hypermetabolic abdominal pelvic adenopathy.  SKELETON  No focal hypermetabolic activity to suggest skeletal metastasis.  IMPRESSION: 1. Rind of intensely hypermetabolic pleural thickening within the entirety of the right hemi thorax is concerning for trans pleural spread of lung carcinoma. 2. No clear primary lesion identified within the right lung. Nodules in the inferior right lower lobe and right middle lobe are not significant metabolic compared to the pleural thickening. 3. Extensive hypermetabolic nodal metastasis within the mediastinum. 4. Contralateral left super clavicular hypermetabolic nodal metastasis. 5. No evidence metastasis  below the diaphragm. No skeletal metastasis   Electronically Signed   By: Suzy Bouchard M.D.   On: 01/24/2015 09:08   Dg Foot Complete Right  02/05/2015   CLINICAL DATA:  Pain  EXAM: RIGHT FOOT COMPLETE - 3+ VIEW  COMPARISON:  None.  FINDINGS: Three views of the right foot submitted. There is diffuse osteopenia. Mild hallux valgus deformity. Degenerative changes first metatarsal phalangeal joint. Mild degenerative changes distal aspect first metatarsal. Dorsal spurring metatarsal region.  IMPRESSION: No acute fracture or subluxation. Mild hallux valgus deformity. Degenerative changes first metatarsal phalangeal joint and distal aspect of first metatarsal. Mild soft tissue swelling dorsal metatarsal region.   Electronically Signed   By: Lahoma Crocker M.D.   On: 02/05/2015 17:55   Mm Diag Breast Tomo Bilateral  01/29/2015   CLINICAL DATA:  81 year old female with recently diagnosed adenocarcinoma with a malignant right pleural effusion. No breast complaints today.  EXAM: DIGITAL DIAGNOSTIC BILATERAL MAMMOGRAM WITH 3D TOMOSYNTHESIS AND CAD  COMPARISON:  Previous exam(s).  ACR Breast Density Category c: The breast tissue is heterogeneously  dense, which may obscure small masses.  FINDINGS: No suspicious masses or calcifications are seen in either breast. There is no mammographic evidence of malignancy in either breast.  Mammographic images were processed with CAD.  IMPRESSION: No mammographic evidence of malignancy in either breast.  RECOMMENDATION: Screening mammogram in one year.(Code:[id number]-01Y)  I have discussed the findings and recommendations with the patient. Results were also provided in writing at the conclusion of the visit. If applicable, a reminder letter will be sent to the patient regarding the next appointment.  BI-RADS CATEGORY  1: Negative.   Electronically Signed   By: Everlean Alstrom M.D.   On: 01/29/2015 13:33     EKG Interpretation None      MDM   Final diagnoses:  Cellulitis of toe of right foot    Pt with redness and duskiness to right big toe with suggestions of cellulitis.  No DVT is found. She does have dopplerable pulses in the right foot. I'm unable to obtain dopplerable pulse in the left foot. She does have some slight duskiness of the toes the left foot but no pain or other suggestions of acute ischemia. She was started on clindamycin. I spoke with the hospitalist to admit the patient. She may need arterial studies or a vascular surgery consult.  Patient is noted to have had a PET scan today. These findings are concerning for malignancy in her chest. I did discuss these findings with the patient and her family.  Dr. Roel Cluck saw pt and now pt has increasing pain to left leg.  ?arterial thrombus.  Spoke with Dr. Scot Dock who will see pt.  Malvin Johns, MD 02/11/2015 [id number]  Malvin Johns, MD 02/04/2015 2039  Malvin Johns, MD 02/13/2015 2140

## 2015-01-30 NOTE — ED Notes (Signed)
Report attempted 

## 2015-01-30 NOTE — Progress Notes (Signed)
ANTIBIOTIC/ANTICOAGULATION CONSULT NOTE - INITIAL  Pharmacy Consult for Vancomycin and Lovenox Indication: foot infection and VTE prophylaxis  Allergies  Allergen Reactions  . Sulfa Antibiotics     Hives/ itching     Patient Measurements: Height: '5\' 4"'$  (162.6 cm) Weight: 186 lb 12.8 oz (84.732 kg) IBW/kg (Calculated) : 54.7  Vital Signs: Temp: 98.8 F (37.1 C) (07/15 2315) Temp Source: Oral (07/15 2315) BP: 143/57 mmHg (07/15 2315) Pulse Rate: 107 (07/15 2315) Intake/Output from previous day:   Intake/Output from this shift:    Labs:  Recent Labs  02/01/2015 1700  WBC 12.2*  HGB 12.9  PLT 127*  CREATININE 1.86*   Estimated Creatinine Clearance: 26.2 mL/min (by C-G formula based on Cr of 1.86). No results for input(s): VANCOTROUGH, VANCOPEAK, VANCORANDOM, GENTTROUGH, GENTPEAK, GENTRANDOM, TOBRATROUGH, TOBRAPEAK, TOBRARND, AMIKACINPEAK, AMIKACINTROU, AMIKACIN in the last 72 hours.   Microbiology: Recent Results (from the past 720 hour(s))  Culture, body fluid-bottle     Status: None   Collection Time: 01/17/15 10:56 AM  Result Value Ref Range Status   Specimen Description FLUID RIGHT PLEURAL  Final   Special Requests NONE  Final   Culture NO GROWTH 5 DAYS  Final   Report Status 01/22/2015 FINAL  Final  Gram stain     Status: None   Collection Time: 01/17/15 10:56 AM  Result Value Ref Range Status   Specimen Description FLUID RIGHT PLEURAL  Final   Special Requests NONE  Final   Gram Stain   Final    FEW WBC PRESENT,BOTH PMN AND MONONUCLEAR NO ORGANISMS SEEN    Report Status 01/21/2015 FINAL  Final  MRSA PCR Screening     Status: None   Collection Time: 01/21/15  3:15 AM  Result Value Ref Range Status   MRSA by PCR NEGATIVE NEGATIVE Final    Comment:        The GeneXpert MRSA Assay (FDA approved for NASAL specimens only), is one component of a comprehensive MRSA colonization surveillance program. It is not intended to diagnose MRSA infection nor to  guide or monitor treatment for MRSA infections.     Medical History: Past Medical History  Diagnosis Date  . Hypertension   . Glaucoma   . Chronic kidney disease   . Neuromuscular disorder   . Diabetes mellitus without complication   . Asthma   . Hyperlipidemia     Medications:  Prescriptions prior to admission  Medication Sig Dispense Refill Last Dose  . amLODipine (NORVASC) 10 MG tablet Take 1 tablet (10 mg total) by mouth daily. 30 tablet 0 02/14/2015 at Unknown time  . bimatoprost (LUMIGAN) 0.01 % SOLN Place 1 drop into both eyes at bedtime.    01/29/2015 at Unknown time  . brimonidine (ALPHAGAN P) 0.1 % SOLN Place 1 drop into both eyes 3 (three) times daily.    01/20/2015 at Unknown time  . dorzolamide (TRUSOPT) 2 % ophthalmic solution Place 1 drop into the right eye 3 (three) times daily.  12 01/29/2015 at Unknown time  . HYDROcodone-acetaminophen (NORCO/VICODIN) 5-325 MG per tablet Take 1 tablet by mouth every 4 (four) hours as needed for moderate pain or severe pain. 30 tablet 0 02/14/2015 at Unknown time  . Multiple Vitamin (MULTIVITAMIN) tablet Take 1 tablet by mouth daily.   01/26/2015 at Unknown time  . PROAIR HFA 108 (90 BASE) MCG/ACT inhaler Inhale 1-2 puffs into the lungs every 6 (six) hours as needed for wheezing or shortness of breath.   3 01/29/2015 at  Unknown time   Assessment: 81 y.o. female presents with R ingrown toenail x 2 weeks and increased R leg swelling. To begin Vancomycin for R leg cellulitis. Noted pt already on Zosyn per MD. SCr 1.86, estimated CrCl 26 ml/min. Afeb. WBC elevated to 12.2.  Pharmacy also asked to dose Lovenox for VTE prophylaxis. CBC stable except plt down to 127 - watch  Goal of Therapy:  Vancomycin trough level 10-15 mcg/ml  Plan:  Vancomycin 1gm IV nowo then '750mg'$  IV q24h Lovenox '30mg'$  SQ q24h Will f/u renal function, micro data and pt's clinical condition Vanc trough prn  Sherlon Handing, PharmD, BCPS Clinical pharmacist, pager  [phone] 02/01/2015,11:19 PM

## 2015-01-31 DIAGNOSIS — J948 Other specified pleural conditions: Secondary | ICD-10-CM

## 2015-01-31 LAB — GLUCOSE, CAPILLARY
GLUCOSE-CAPILLARY: 150 mg/dL — AB (ref 65–99)
GLUCOSE-CAPILLARY: 136 mg/dL — AB (ref 65–99)
GLUCOSE-CAPILLARY: 124 mg/dL — AB (ref 65–99)
Glucose-Capillary: 119 mg/dL — ABNORMAL HIGH (ref 65–99)
Glucose-Capillary: 140 mg/dL — ABNORMAL HIGH (ref 65–99)
Glucose-Capillary: 172 mg/dL — ABNORMAL HIGH (ref 65–99)

## 2015-01-31 LAB — COMPREHENSIVE METABOLIC PANEL
ALK PHOS: 53 U/L (ref 38–126)
ALT: 31 U/L (ref 14–54)
ANION GAP: 9 (ref 5–15)
AST: 28 U/L (ref 15–41)
Albumin: 2.5 g/dL — ABNORMAL LOW (ref 3.5–5.0)
BILIRUBIN TOTAL: 0.6 mg/dL (ref 0.3–1.2)
BUN: 18 mg/dL (ref 6–20)
CHLORIDE: 105 mmol/L (ref 101–111)
CO2: 23 mmol/L (ref 22–32)
Calcium: 8.3 mg/dL — ABNORMAL LOW (ref 8.9–10.3)
Creatinine, Ser: 1.76 mg/dL — ABNORMAL HIGH (ref 0.44–1.00)
GFR calc Af Amer: 31 mL/min — ABNORMAL LOW (ref >60)
GFR, EST NON AFRICAN AMERICAN: 27 mL/min — AB (ref >60)
Glucose, Bld: 168 mg/dL — ABNORMAL HIGH (ref 65–99)
Potassium: 4.1 mmol/L (ref 3.5–5.1)
Sodium: 137 mmol/L (ref 135–145)
Total Protein: 6 g/dL — ABNORMAL LOW (ref 6.5–8.1)

## 2015-01-31 LAB — CBC
HEMATOCRIT: 35.5 % — AB (ref 36.0–46.0)
Hemoglobin: 11.9 g/dL — ABNORMAL LOW (ref 12.0–15.0)
MCH: 26.3 pg (ref 26.0–34.0)
MCHC: 33.5 g/dL (ref 30.0–36.0)
MCV: 78.5 fL (ref 78.0–100.0)
Platelets: 130 10*3/uL — ABNORMAL LOW (ref 150–400)
RBC: 4.52 MIL/uL (ref 3.87–5.11)
RDW: 14.3 % (ref 11.5–15.5)
WBC: 11.7 10*3/uL — ABNORMAL HIGH (ref 4.0–10.5)

## 2015-01-31 LAB — MAGNESIUM: MAGNESIUM: 2.1 mg/dL (ref 1.7–2.4)

## 2015-01-31 LAB — LACTIC ACID, PLASMA: Lactic Acid, Venous: 1.1 mmol/L (ref 0.5–2.0)

## 2015-01-31 LAB — TSH: TSH: 0.548 u[IU]/mL (ref 0.350–4.500)

## 2015-01-31 LAB — PHOSPHORUS: PHOSPHORUS: 3.7 mg/dL (ref 2.5–4.6)

## 2015-01-31 MED ORDER — INSULIN ASPART 100 UNIT/ML ~~LOC~~ SOLN: 0.0000 [IU] | Freq: Three times a day (TID) | SUBCUTANEOUS | Status: DC

## 2015-01-31 MED ORDER — HYDRALAZINE HCL 20 MG/ML IJ SOLN
10.0000 mg | Freq: Four times a day (QID) | INTRAMUSCULAR | Status: DC | PRN
  Administered 2015-01-31: 10 mg via INTRAVENOUS
  Filled 2015-01-31: qty 1

## 2015-01-31 MED ORDER — SODIUM CHLORIDE 0.9 % IV SOLN: INTRAVENOUS | Status: AC

## 2015-01-31 MED ORDER — HYDROCODONE-ACETAMINOPHEN 5-325 MG PO TABS
1.0000 | ORAL_TABLET | ORAL | Status: DC | PRN
  Administered 2015-01-31 – 2015-02-03 (×11): 2 via ORAL
  Filled 2015-01-31 (×12): qty 2

## 2015-01-31 MED ORDER — INSULIN ASPART 100 UNIT/ML ~~LOC~~ SOLN: 0.0000 [IU] | Freq: Every day | SUBCUTANEOUS | Status: DC

## 2015-01-31 MED ORDER — ATORVASTATIN CALCIUM 20 MG PO TABS
20.0000 mg | ORAL_TABLET | Freq: Every day | ORAL | Status: DC
  Administered 2015-01-31 – 2015-02-06 (×7): 20 mg via ORAL
  Filled 2015-01-31 (×10): qty 1

## 2015-01-31 MED ORDER — OXYCODONE HCL 5 MG PO TABS
5.0000 mg | ORAL_TABLET | Freq: Once | ORAL | Status: AC
  Administered 2015-01-31: 5 mg via ORAL
  Filled 2015-01-31: qty 1

## 2015-01-31 MED ORDER — FENTANYL 12 MCG/HR TD PT72
25.0000 ug | MEDICATED_PATCH | TRANSDERMAL | Status: DC
  Administered 2015-01-31: 25 ug via TRANSDERMAL
  Filled 2015-01-31: qty 1

## 2015-01-31 NOTE — Consult Note (Signed)
Reason for Consult: Ischemic changes right great toe possible paronychial infection with ingrown great toenail Referring Physician: Dr. Stann Ore April Kennedy is an 81 y.o. female.  HPI: Patient is a 81 year old woman with chronic peripheral vascular disease who is seen for evaluation of the ischemic changes to the right great toe possible deep nail infection.  Past Medical History  Diagnosis Date  . Hypertension   . Glaucoma   . Chronic kidney disease   . Neuromuscular disorder   . Diabetes mellitus without complication   . Asthma   . Hyperlipidemia     Past Surgical History  Procedure Laterality Date  . Chest tube insertion Right 01/26/2015    Procedure: INSERTION RIGHT PLEURAL DRAINAGE CATHETER;  Surgeon: Ivin Poot, MD;  Location: [facility];  Service: Thoracic;  Laterality: Right;    Family History  Problem Relation Age of Onset  . Hypertension      Social History:  reports that she has quit smoking. She does not have any smokeless tobacco history on file. She reports that she does not drink alcohol or use illicit drugs.  Allergies:  Allergies  Allergen Reactions  . Sulfa Antibiotics     Hives/ itching     Medications: I have reviewed the patient's current medications.  Results for orders placed or performed during the hospital encounter of 02/15/2015 (from the past 48 hour(s))  CBC with Differential     Status: Abnormal   Collection Time: 01/27/2015  5:00 PM  Result Value Ref Range   WBC 12.2 (H) 4.0 - 10.5 K/uL   RBC 4.84 3.87 - 5.11 MIL/uL   Hemoglobin 12.9 12.0 - 15.0 g/dL   HCT 38.0 36.0 - 46.0 %   MCV 78.5 78.0 - 100.0 fL   MCH 26.7 26.0 - 34.0 pg   MCHC 33.9 30.0 - 36.0 g/dL   RDW 14.2 11.5 - 15.5 %   Platelets 127 (L) 150 - 400 K/uL   Neutrophils Relative % 74 43 - 77 %   Neutro Abs 9.0 (H) 1.7 - 7.7 K/uL   Lymphocytes Relative 14 12 - 46 %   Lymphs Abs 1.8 0.7 - 4.0 K/uL   Monocytes Relative 10 3 - 12 %   Monocytes Absolute 1.2 (H) 0.1 - 1.0 K/uL    Eosinophils Relative 2 0 - 5 %   Eosinophils Absolute 0.2 0.0 - 0.7 K/uL   Basophils Relative 0 0 - 1 %   Basophils Absolute 0.0 0.0 - 0.1 K/uL  Comprehensive metabolic panel     Status: Abnormal   Collection Time: 01/28/2015  5:00 PM  Result Value Ref Range   Sodium 138 135 - 145 mmol/L   Potassium 4.1 3.5 - 5.1 mmol/L   Chloride 102 101 - 111 mmol/L   CO2 24 22 - 32 mmol/L   Glucose, Bld 153 (H) 65 - 99 mg/dL   BUN 19 6 - 20 mg/dL   Creatinine, Ser 1.86 (H) 0.44 - 1.00 mg/dL   Calcium 8.6 (L) 8.9 - 10.3 mg/dL   Total Protein 6.7 6.5 - 8.1 g/dL   Albumin 2.9 (L) 3.5 - 5.0 g/dL   AST 33 15 - 41 U/L   ALT 33 14 - 54 U/L   Alkaline Phosphatase 59 38 - 126 U/L   Total Bilirubin 0.7 0.3 - 1.2 mg/dL   GFR calc non Af Amer 25 (L) >60 mL/min   GFR calc Af Amer 29 (L) >60 mL/min    Comment: (NOTE) The  eGFR has been calculated using the CKD EPI equation. This calculation has not been validated in all clinical situations. eGFR's persistently <60 mL/min signify possible Chronic Kidney Disease.    Anion gap 12 5 - 15  I-stat troponin, ED     Status: None   Collection Time: 01/22/2015  5:24 PM  Result Value Ref Range   Troponin i, poc 0.03 0.00 - 0.08 ng/mL   Comment 3            Comment: Due to the release kinetics of cTnI, a negative result within the first hours of the onset of symptoms does not rule out myocardial infarction with certainty. If myocardial infarction is still suspected, repeat the test at appropriate intervals.   I-Stat CG4 Lactic Acid, ED     Status: None   Collection Time: 02/13/2015  8:33 PM  Result Value Ref Range   Lactic Acid, Venous 1.71 0.5 - 2.0 mmol/L  Glucose, capillary     Status: Abnormal   Collection Time: 01/31/15 12:48 AM  Result Value Ref Range   Glucose-Capillary 140 (H) 65 - 99 mg/dL  Glucose, capillary     Status: Abnormal   Collection Time: 01/31/15  4:27 AM  Result Value Ref Range   Glucose-Capillary 150 (H) 65 - 99 mg/dL  Magnesium      Status: None   Collection Time: 01/31/15  5:25 AM  Result Value Ref Range   Magnesium 2.1 1.7 - 2.4 mg/dL  Phosphorus     Status: None   Collection Time: 01/31/15  5:25 AM  Result Value Ref Range   Phosphorus 3.7 2.5 - 4.6 mg/dL  TSH     Status: None   Collection Time: 01/31/15  5:25 AM  Result Value Ref Range   TSH 0.548 0.350 - 4.500 uIU/mL  Comprehensive metabolic panel     Status: Abnormal   Collection Time: 01/31/15  5:25 AM  Result Value Ref Range   Sodium 137 135 - 145 mmol/L   Potassium 4.1 3.5 - 5.1 mmol/L   Chloride 105 101 - 111 mmol/L   CO2 23 22 - 32 mmol/L   Glucose, Bld 168 (H) 65 - 99 mg/dL   BUN 18 6 - 20 mg/dL   Creatinine, Ser 1.76 (H) 0.44 - 1.00 mg/dL   Calcium 8.3 (L) 8.9 - 10.3 mg/dL   Total Protein 6.0 (L) 6.5 - 8.1 g/dL   Albumin 2.5 (L) 3.5 - 5.0 g/dL   AST 28 15 - 41 U/L   ALT 31 14 - 54 U/L   Alkaline Phosphatase 53 38 - 126 U/L   Total Bilirubin 0.6 0.3 - 1.2 mg/dL   GFR calc non Af Amer 27 (L) >60 mL/min   GFR calc Af Amer 31 (L) >60 mL/min    Comment: (NOTE) The eGFR has been calculated using the CKD EPI equation. This calculation has not been validated in all clinical situations. eGFR's persistently <60 mL/min signify possible Chronic Kidney Disease.    Anion gap 9 5 - 15  CBC     Status: Abnormal   Collection Time: 01/31/15  5:25 AM  Result Value Ref Range   WBC 11.7 (H) 4.0 - 10.5 K/uL   RBC 4.52 3.87 - 5.11 MIL/uL   Hemoglobin 11.9 (L) 12.0 - 15.0 g/dL   HCT 35.5 (L) 36.0 - 46.0 %   MCV 78.5 78.0 - 100.0 fL   MCH 26.3 26.0 - 34.0 pg   MCHC 33.5 30.0 - 36.0 g/dL  RDW 14.3 11.5 - 15.5 %   Platelets 130 (L) 150 - 400 K/uL  Lactic acid, plasma     Status: None   Collection Time: 01/31/15  5:25 AM  Result Value Ref Range   Lactic Acid, Venous 1.1 0.5 - 2.0 mmol/L  Glucose, capillary     Status: Abnormal   Collection Time: 01/31/15  8:10 AM  Result Value Ref Range   Glucose-Capillary 136 (H) 65 - 99 mg/dL    Dg Chest 2  View  02/05/2015   CLINICAL DATA:  Chest pain  EXAM: CHEST  2 VIEW  COMPARISON:  PET-CT 02/13/2015, chest x-ray 01/27/2015  FINDINGS: There is a partially loculated right lower lobe small pleural effusion. There is a small left pleural effusion. There is right lower lung airspace disease unchanged compared with 01/27/2015. There is mild right lung interstitial thickening. There is a right-sided PleurX catheter present.  There is no pneumothorax. The heart size is stable. There is right hilar lymphadenopathy.  There is no acute osseous abnormality.  IMPRESSION: Small right pleural effusion with right basilar airspace disease unchanged compared with 01/27/2015. This may reflect atelectasis versus pneumonia.   Electronically Signed   By: Kathreen Devoid   On: 01/22/2015 17:58   Mr Brain W Wo Contrast  01/23/2015   CLINICAL DATA:  Lung cancer with recurrent malignant right pleural effusion. Staging.  EXAM: MRI HEAD WITHOUT AND WITH CONTRAST  TECHNIQUE: Multiplanar, multiecho pulse sequences of the brain and surrounding structures were obtained without and with intravenous contrast.  CONTRAST:  61mL MULTIHANCE GADOBENATE DIMEGLUMINE 529 MG/ML IV SOLN  COMPARISON:  None.  FINDINGS: No acute infarct, hemorrhage, or mass lesion is present. Minimal periventricular and subcortical T2 changes bilaterally are within normal limits for age. The ventricles are of normal size. No significant extraaxial fluid collection is present.  Flow is present in the major intracranial arteries. A right lens replacement is noted. The globes and orbits are otherwise intact. The paranasal sinuses and mastoid air cells are clear.  The postcontrast images demonstrate no pathologic enhancement.  Skullbase is within normal limits. Midline images demonstrate degenerative changes in the upper cervical spine. A relatively empty sella is present. No focal lesions are evident.  IMPRESSION: 1. Normal MRI appearance the brain for age. No evidence for  metastatic disease the brain or meninges. 2. Mild to moderate degenerative changes within the upper cervical spine.   Electronically Signed   By: San Morelle M.D.   On: 01/29/2015 13:00   Nm Pet Image Initial (pi) Skull Base To Thigh  02/11/2015   CLINICAL DATA:  Initial treatment strategy for lung adenocarcinoma.  EXAM: NUCLEAR MEDICINE PET SKULL BASE TO THIGH  TECHNIQUE: 9.0 mCi F-18 FDG was injected intravenously. Full-ring PET imaging was performed from the skull base to thigh after the radiotracer. CT data was obtained and used for attenuation correction and anatomic localization.  FASTING BLOOD GLUCOSE:  Value: 170 mg/dl  COMPARISON:  CT 01/18/2015  FINDINGS: NECK  Hypermetabolic left supraclavicular lymph node measures 14 mm short axis on image 45 CT data set. This left supraclavicular node is intensely metabolic with SUV max equals 7.9.  CHEST  There is a rind of intensely hypermetabolic pleural thickening within the right hemi thorax. This hypermetabolic thickening over the diaphragm is intense with SUV max equal 8.1. Hypermetabolic thickening extends medially within the right hemi thorax and the right hilum. This hypermetabolic thickening appears to be on the parietal and pleural surface.  There are discrete  nodules within the right lower lobe which are not as metabolic as the pleural thickening. Largest nodule measures 29 mm on image 78, series 4.  Extensive hypermetabolic mediastinal lymphadenopathy. A precarinal lymph node measures 17 mm with SUV max equals 6.8. Subcarinal lymph node measures 30 mm would equal intensity. Prevascular lymph nodes also noted.  ABDOMEN/PELVIS  No abnormal metabolic activity within the liver. Mild activity associated with the left adrenal gland similar to liver activity and therefore likely benign. No measurable lesion. No hypermetabolic abdominal pelvic adenopathy.  SKELETON  No focal hypermetabolic activity to suggest skeletal metastasis.  IMPRESSION: 1. Rind  of intensely hypermetabolic pleural thickening within the entirety of the right hemi thorax is concerning for trans pleural spread of lung carcinoma. 2. No clear primary lesion identified within the right lung. Nodules in the inferior right lower lobe and right middle lobe are not significant metabolic compared to the pleural thickening. 3. Extensive hypermetabolic nodal metastasis within the mediastinum. 4. Contralateral left super clavicular hypermetabolic nodal metastasis. 5. No evidence metastasis below the diaphragm. No skeletal metastasis   Electronically Signed   By: Suzy Bouchard M.D.   On: 01/20/2015 09:08   Dg Foot Complete Right  01/17/2015   CLINICAL DATA:  Pain  EXAM: RIGHT FOOT COMPLETE - 3+ VIEW  COMPARISON:  None.  FINDINGS: Three views of the right foot submitted. There is diffuse osteopenia. Mild hallux valgus deformity. Degenerative changes first metatarsal phalangeal joint. Mild degenerative changes distal aspect first metatarsal. Dorsal spurring metatarsal region.  IMPRESSION: No acute fracture or subluxation. Mild hallux valgus deformity. Degenerative changes first metatarsal phalangeal joint and distal aspect of first metatarsal. Mild soft tissue swelling dorsal metatarsal region.   Electronically Signed   By: Lahoma Crocker M.D.   On: 01/26/2015 17:55   Mm Diag Breast Tomo Bilateral  01/29/2015   CLINICAL DATA:  81 year old female with recently diagnosed adenocarcinoma with a malignant right pleural effusion. No breast complaints today.  EXAM: DIGITAL DIAGNOSTIC BILATERAL MAMMOGRAM WITH 3D TOMOSYNTHESIS AND CAD  COMPARISON:  Previous exam(s).  ACR Breast Density Category c: The breast tissue is heterogeneously dense, which may obscure small masses.  FINDINGS: No suspicious masses or calcifications are seen in either breast. There is no mammographic evidence of malignancy in either breast.  Mammographic images were processed with CAD.  IMPRESSION: No mammographic evidence of malignancy in  either breast.  RECOMMENDATION: Screening mammogram in one year.(Code:[id number])  I have discussed the findings and recommendations with the patient. Results were also provided in writing at the conclusion of the visit. If applicable, a reminder letter will be sent to the patient regarding the next appointment.  BI-RADS CATEGORY  1: Negative.   Electronically Signed   By: Everlean Alstrom M.D.   On: 01/29/2015 13:33    ROS Blood pressure 150/83, pulse 116, temperature 98.4 F (36.9 C), temperature source Oral, resp. rate 23, height $RemoveBe'5\' 4"'[id number]$  (1.626 m), weight 84.732 kg (186 lb 12.8 oz), SpO2 97 %. Physical Exam On examination patient has chronic ischemic changes to the left foot. Patient states her left foot is feeling a bit warmer. There are no ischemic ulcers to the left foot. She does not have palpable pulses. Examination the right foot she does not have palpable pulses. She has black ischemic changes to the right great toe. She does have an ingrown toenail but no signs of a paronychial infection no tenderness to palpation. No redness no swelling at the base of the nail. Assessment/Plan: Assessment: Ischemic right  great toe with chronic peripheral vascular disease without signs of a paronychial infection with ingrown great toenail.  Plan: Patient to undergo vascular workup on Monday. I will follow-up as needed.  Smt Lokey V 01/31/2015, 11:27 AM

## 2015-01-31 NOTE — Progress Notes (Signed)
   VASCULAR SURGERY ASSESSMENT & PLAN:  *  I have offered to proceed with arteriography Monday, however, she says that she wants to go home. If she decides to stay, I can arrange for an arteriogram on Monday to evaluate her left LE multilevel arterial occlusive disease. If her crt does not improve with hydration, this would have to be done with CO2.   * Given her history of lung cancer with a recurrent malignant pulmonary effusion, I would not favor an overly aggressive approach.   SUBJECTIVE: Complains of pain in left foot.   PHYSICAL EXAM: Filed Vitals:   01/27/2015 2259 01/17/2015 2312 01/28/2015 2315 01/31/15 0519  BP:   143/57 150/83  Pulse:   107 116  Temp:   98.8 F (37.1 C) 98.4 F (36.9 C)  TempSrc:   Oral Oral  Resp:   20 23  Height:  '5\' 4"'$  (1.626 m)    Weight:  186 lb 12.8 oz (84.732 kg)    SpO2: 91%  98% 97%   Faint ATA and Peroneal signal with the doppler.  Left foot slightly cool.  Right great toe unchanged.  No left femoral pulse. Palpable right femoral pulse.   LABS: Lab Results  Component Value Date   WBC 11.7* 01/31/2015   HGB 11.9* 01/31/2015   HCT 35.5* 01/31/2015   MCV 78.5 01/31/2015   PLT 130* 01/31/2015   Lab Results  Component Value Date   CREATININE 1.76* 01/31/2015   Lab Results  Component Value Date   INR 1.18 01/26/2015   CBG (last 3)   Recent Labs  02/01/2015 0708 01/31/15 0048 01/31/15 0427  GLUCAP 170* 140* 150*    Active Problems:   Pleural effusion, right   Essential hypertension   Chronic kidney disease, stage 3   Left leg cellulitis   Cellulitis   Foot pain, left   Malignant pleural effusion    Gae Gallop Beeper: [phone] 01/31/2015

## 2015-01-31 NOTE — Progress Notes (Signed)
Patient Demographics:    April Kennedy, is a 81 y.o. female, DOB - [birth year], HUT:[id number]  Admit date - 01/29/2015   Admitting Physician Toy Baker, MD  Outpatient Primary MD for the patient is Marlou Sa, ERIC, MD  LOS - 1   Chief Complaint  Patient presents with  . Toe Pain    The patient sais she went to [facility] and she was sent here to the ED.  The patient denies any injury to the toe but says she is in pain.  She said she started having pain and swelling.        Subjective:    April Kennedy today has, No headache, No chest pain, No abdominal pain - No Nausea, No new weakness tingling or numbness, No Cough - SOB. L>R foot pain   Assessment  & Plan :     1. Bilateral left more than right foot pain. Exam consistent with bilateral foot ischemia, vascular surgery and orthopedics on board, ABI and 2-D echo ordered, arteriogram per Dr. Doren Custard to be done on Monday. Case discussed with him. Until new gentle hydration, continue aspirin, continue vancomycin and Zosyn. We will add statin. Monitor closely.   2. Recent diagnosis of stage IV metastatic adenocarcinoma with suspected primary in the lung. Metastases to the mediastinal lymph nodes. MRI brain recently noted which was unremarkable, mammogram done recently rules out breast cancer. She had a recurrent right-sided malignant pleural effusion for which she has a Pleurx catheter in place which will be continued. She follows with Dr. Sonny Dandy and will follow with him post discharge.   3. Chronic kidney disease stage IV. Baseline creatinine close to 1.9. Around baseline. Monitor.   4. Essential hypertension. Placed on hydralazine as needed.    5. DM type II. On sliding scale continue.  Lab Results  Component Value Date   HGBA1C 5.9 11/10/2011     CBG (last 3)   Recent Labs  01/31/15 0048 01/31/15 0427 01/31/15 0810  GLUCAP 140* 150* 136*      Code Status : Full  Family Communication  : None Present  Disposition Plan  : TBD  Consults  :  VVS, Ortho  Procedures  :   ABI  TTE  DVT Prophylaxis  :  Lovenox    Lab Results  Component Value Date   PLT 130* 01/31/2015    Inpatient Medications  Scheduled Meds: . aspirin EC  81 mg Oral Daily  . brimonidine  1 drop Both Eyes TID  . dorzolamide  1 drop Right Eye TID  . enoxaparin (LOVENOX) injection  30 mg Subcutaneous Q24H  . fentaNYL  25 mcg Transdermal Q72H  . insulin aspart  0-9 Units Subcutaneous 6 times per day  . latanoprost  1 drop Both Eyes QHS  . piperacillin-tazobactam (ZOSYN)  IV  3.375 g Intravenous 3 times per day  . sodium chloride  3 mL Intravenous Q12H  . vancomycin  750 mg Intravenous Q24H   Continuous Infusions: . sodium chloride     PRN Meds:.acetaminophen **OR** acetaminophen, albuterol, HYDROcodone-acetaminophen, ondansetron **OR** ondansetron (ZOFRAN) IV  Antibiotics  :     Anti-infectives    Start     Dose/Rate Route Frequency Ordered Stop   01/31/15 2300  vancomycin (VANCOCIN) IVPB  750 mg/150 ml premix     750 mg 150 mL/hr over 60 Minutes Intravenous Every 24 hours 01/18/2015 2327     01/31/15 0600  piperacillin-tazobactam (ZOSYN) IVPB 3.375 g     3.375 g 12.5 mL/hr over 240 Minutes Intravenous 3 times per day 02/10/2015 2258     01/17/2015 2330  vancomycin (VANCOCIN) IVPB 1000 mg/200 mL premix     1,000 mg 200 mL/hr over 60 Minutes Intravenous STAT 01/26/2015 2327 01/31/15 0125   02/09/2015 2315  piperacillin-tazobactam (ZOSYN) IVPB 3.375 g     3.375 g 100 mL/hr over 30 Minutes Intravenous  Once 02/06/2015 2301 01/31/15 0056   02/06/2015 1830  clindamycin (CLEOCIN) IVPB 600 mg     600 mg 100 mL/hr over 30 Minutes Intravenous  Once 01/18/2015 1824 01/29/2015 2020        Objective:   Filed Vitals:   02/11/2015 2259 02/13/2015 2312  02/08/2015 2315 01/31/15 0519  BP:   143/57 150/83  Pulse:   107 116  Temp:   98.8 F (37.1 C) 98.4 F (36.9 C)  TempSrc:   Oral Oral  Resp:   20 23  Height:  '5\' 4"'$  (1.626 m)    Weight:  84.732 kg (186 lb 12.8 oz)    SpO2: 91%  98% 97%    Wt Readings from Last 3 Encounters:  02/14/2015 84.732 kg (186 lb 12.8 oz)  01/27/15 82.4 kg (181 lb 10.5 oz)     Intake/Output Summary (Last 24 hours) at 01/31/15 1134 Last data filed at 01/31/15 0217  Gross per 24 hour  Intake      0 ml  Output    200 ml  Net   -200 ml     Physical Exam  Awake Alert, Oriented X 3, No new F.N deficits, Normal affect [place].AT,PERRAL Supple Neck,No JVD, No cervical lymphadenopathy appriciated.  Symmetrical Chest wall movement, Good air movement bilaterally, CTAB, R Pl. catheter RRR,No Gallops,Rubs or new Murmurs, No Parasternal Heave +ve B.Sounds, Abd Soft, No tenderness, No organomegaly appriciated, No rebound - guarding or rigidity. No Cyanosis, Clubbing or edema, No new Rash or bruise Bilat feet L>R appear cyanotic and dark, poor pulsations    Data Review:   Micro Results No results found for this or any previous visit (from the past 240 hour(s)).  Radiology Reports   Dg Chest 2 View  02/06/2015   CLINICAL DATA:  Chest pain  EXAM: CHEST  2 VIEW  COMPARISON:  PET-CT 01/21/2015, chest x-ray 01/27/2015  FINDINGS: There is a partially loculated right lower lobe small pleural effusion. There is a small left pleural effusion. There is right lower lung airspace disease unchanged compared with 01/27/2015. There is mild right lung interstitial thickening. There is a right-sided PleurX catheter present.  There is no pneumothorax. The heart size is stable. There is right hilar lymphadenopathy.  There is no acute osseous abnormality.  IMPRESSION: Small right pleural effusion with right basilar airspace disease unchanged compared with 01/27/2015. This may reflect atelectasis versus pneumonia.   Electronically Signed    By: Kathreen Devoid   On: 02/05/2015 17:58     Mr Brain W Wo Contrast  01/25/2015   CLINICAL DATA:  Lung cancer with recurrent malignant right pleural effusion. Staging.  EXAM: MRI HEAD WITHOUT AND WITH CONTRAST  TECHNIQUE: Multiplanar, multiecho pulse sequences of the brain and surrounding structures were obtained without and with intravenous contrast.  CONTRAST:  68m MULTIHANCE GADOBENATE DIMEGLUMINE 529 MG/ML IV SOLN  COMPARISON:  None.  FINDINGS: No acute infarct, hemorrhage, or mass lesion is present. Minimal periventricular and subcortical T2 changes bilaterally are within normal limits for age. The ventricles are of normal size. No significant extraaxial fluid collection is present.  Flow is present in the major intracranial arteries. A right lens replacement is noted. The globes and orbits are otherwise intact. The paranasal sinuses and mastoid air cells are clear.  The postcontrast images demonstrate no pathologic enhancement.  Skullbase is within normal limits. Midline images demonstrate degenerative changes in the upper cervical spine. A relatively empty sella is present. No focal lesions are evident.  IMPRESSION: 1. Normal MRI appearance the brain for age. No evidence for metastatic disease the brain or meninges. 2. Mild to moderate degenerative changes within the upper cervical spine.   Electronically Signed   By: San Morelle M.D.   On: 02/15/2015 13:00      Nm Pet Image Initial (pi) Skull Base To Thigh  01/16/2015   CLINICAL DATA:  Initial treatment strategy for lung adenocarcinoma.  EXAM: NUCLEAR MEDICINE PET SKULL BASE TO THIGH  TECHNIQUE: 9.0 mCi F-18 FDG was injected intravenously. Full-ring PET imaging was performed from the skull base to thigh after the radiotracer. CT data was obtained and used for attenuation correction and anatomic localization.  FASTING BLOOD GLUCOSE:  Value: 170 mg/dl  COMPARISON:  CT 01/18/2015  FINDINGS: NECK  Hypermetabolic left supraclavicular lymph node  measures 14 mm short axis on image 45 CT data set. This left supraclavicular node is intensely metabolic with SUV max equals 7.9.  CHEST  There is a rind of intensely hypermetabolic pleural thickening within the right hemi thorax. This hypermetabolic thickening over the diaphragm is intense with SUV max equal 8.1. Hypermetabolic thickening extends medially within the right hemi thorax and the right hilum. This hypermetabolic thickening appears to be on the parietal and pleural surface.  There are discrete nodules within the right lower lobe which are not as metabolic as the pleural thickening. Largest nodule measures 29 mm on image 78, series 4.  Extensive hypermetabolic mediastinal lymphadenopathy. A precarinal lymph node measures 17 mm with SUV max equals 6.8. Subcarinal lymph node measures 30 mm would equal intensity. Prevascular lymph nodes also noted.  ABDOMEN/PELVIS  No abnormal metabolic activity within the liver. Mild activity associated with the left adrenal gland similar to liver activity and therefore likely benign. No measurable lesion. No hypermetabolic abdominal pelvic adenopathy.  SKELETON  No focal hypermetabolic activity to suggest skeletal metastasis.  IMPRESSION: 1. Rind of intensely hypermetabolic pleural thickening within the entirety of the right hemi thorax is concerning for trans pleural spread of lung carcinoma. 2. No clear primary lesion identified within the right lung. Nodules in the inferior right lower lobe and right middle lobe are not significant metabolic compared to the pleural thickening. 3. Extensive hypermetabolic nodal metastasis within the mediastinum. 4. Contralateral left super clavicular hypermetabolic nodal metastasis. 5. No evidence metastasis below the diaphragm. No skeletal metastasis   Electronically Signed   By: Suzy Bouchard M.D.   On: 02/10/2015 09:08      Dg Foot Complete Right  01/29/2015   CLINICAL DATA:  Pain  EXAM: RIGHT FOOT COMPLETE - 3+ VIEW   COMPARISON:  None.  FINDINGS: Three views of the right foot submitted. There is diffuse osteopenia. Mild hallux valgus deformity. Degenerative changes first metatarsal phalangeal joint. Mild degenerative changes distal aspect first metatarsal. Dorsal spurring metatarsal region.  IMPRESSION: No acute fracture or subluxation. Mild hallux valgus deformity. Degenerative changes  first metatarsal phalangeal joint and distal aspect of first metatarsal. Mild soft tissue swelling dorsal metatarsal region.   Electronically Signed   By: Lahoma Crocker M.D.   On: 02/01/2015 17:55       CBC  Recent Labs Lab 01/25/15 0349 01/25/2015 1700 01/31/15 0525  WBC 13.0* 12.2* 11.7*  HGB 12.9 12.9 11.9*  HCT 38.4 38.0 35.5*  PLT 264 127* 130*  MCV 79.3 78.5 78.5  MCH 26.7 26.7 26.3  MCHC 33.6 33.9 33.5  RDW 13.8 14.2 14.3  LYMPHSABS  --  1.8  --   MONOABS  --  1.2*  --   EOSABS  --  0.2  --   BASOSABS  --  0.0  --     Chemistries   Recent Labs Lab 01/25/15 0349 01/27/15 0510 02/11/2015 1700 01/31/15 0525  NA 137 140 138 137  K 4.4 4.8 4.1 4.1  CL 103 107 102 105  CO2 '26 26 24 23  '$ GLUCOSE 131* 114* 153* 168*  BUN 30* 28* 19 18  CREATININE 1.98* 1.86* 1.86* 1.76*  CALCIUM 8.4* 8.3* 8.6* 8.3*  MG  --   --   --  2.1  AST 27  --  33 28  ALT 46  --  33 31  ALKPHOS 59  --  59 53  BILITOT 0.3  --  0.7 0.6   ------------------------------------------------------------------------------------------------------------------ estimated creatinine clearance is 27.7 mL/min (by C-G formula based on Cr of 1.76). ------------------------------------------------------------------------------------------------------------------ No results for input(s): HGBA1C in the last 72 hours. ------------------------------------------------------------------------------------------------------------------ No results for input(s): CHOL, HDL, LDLCALC, TRIG, CHOLHDL, LDLDIRECT in the last 72  hours. ------------------------------------------------------------------------------------------------------------------  Recent Labs  01/31/15 0525  TSH 0.548   ------------------------------------------------------------------------------------------------------------------ No results for input(s): VITAMINB12, FOLATE, FERRITIN, TIBC, IRON, RETICCTPCT in the last 72 hours.  Coagulation profile  Recent Labs Lab 01/26/15 0256  INR 1.18    No results for input(s): DDIMER in the last 72 hours.  Cardiac Enzymes No results for input(s): CKMB, TROPONINI, MYOGLOBIN in the last 168 hours.  Invalid input(s): CK ------------------------------------------------------------------------------------------------------------------ Invalid input(s): POCBNP   Time Spent in minutes   35   SINGH,PRASHANT K M.D on 01/31/2015 at 11:34 AM  Between 7am to 7pm - Pager - [phone]  After 7pm go to [email or web address] - password [facility]  [facility]   Office  [phone]

## 2015-01-31 NOTE — Progress Notes (Signed)
NURSING PROGRESS NOTE  April Kennedy [id number] Admission Data: 01/31/2015 2:41 AM Attending Provider: Toy Baker, MD April Kennedy, ERIC, MD Code Status: DNR  April Kennedy is a 81 y.o. female patient admitted from ED:  -No acute distress noted.  -No complaints of shortness of breath.  -No complaints of chest pain.   Cardiac Monitoring: Box # 25 in place. Cardiac monitor yields:sinus tachycardia.  Blood pressure 143/57, pulse 107, temperature 98.8 F (37.1 C), temperature source Oral, resp. rate 20, height '5\' 4"'$  (1.626 m), weight 84.732 kg (186 lb 12.8 oz), SpO2 98 %.   IV Fluids:  IV in place, occlusive dsg intact without redness, IV cath hand right, condition patent and no redness normal saline.   Allergies:  Sulfa antibiotics  Past Medical History:   has a past medical history of Hypertension; Glaucoma; Chronic kidney disease; Neuromuscular disorder; Diabetes mellitus without complication; Asthma; and Hyperlipidemia.  Past Surgical History:   has past surgical history that includes Chest tube insertion (Right, 01/26/2015).  Social History:   reports that she has quit smoking. She does not have any smokeless tobacco history on file. She reports that she does not drink alcohol or use illicit drugs.  Skin: Dressing to R torso area from chest tube during previous admission, discoloration to great toe on L and R foot.  Full assessment completed in [facility].  Patient/Family orientated to room. Information packet given to patient/family. Admission inpatient armband information verified with patient/family to include name and date of birth and placed on patient arm. Side rails up x 2, fall assessment and education completed with patient/family. Patient/family able to verbalize understanding of risk associated with falls and verbalized understanding to call for assistance before getting out of bed. Call light within reach. Patient/family able to voice and demonstrate understanding of  unit orientation instructions.

## 2015-01-31 NOTE — Progress Notes (Signed)
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 RN will notify pharmacy and educate patient on following doctor's orders. Will continue to monitor.

## 2015-02-01 ENCOUNTER — Encounter (HOSPITAL_COMMUNITY): Payer: Medicare Other

## 2015-02-01 LAB — BASIC METABOLIC PANEL
ANION GAP: 11 (ref 5–15)
BUN: 18 mg/dL (ref 6–20)
CALCIUM: 8.1 mg/dL — AB (ref 8.9–10.3)
CO2: 24 mmol/L (ref 22–32)
CREATININE: 1.9 mg/dL — AB (ref 0.44–1.00)
Chloride: 102 mmol/L (ref 101–111)
GFR calc Af Amer: 28 mL/min — ABNORMAL LOW (ref >60)
GFR calc non Af Amer: 24 mL/min — ABNORMAL LOW (ref >60)
Glucose, Bld: 130 mg/dL — ABNORMAL HIGH (ref 65–99)
Potassium: 4.4 mmol/L (ref 3.5–5.1)
Sodium: 137 mmol/L (ref 135–145)

## 2015-02-01 LAB — CBC
HEMATOCRIT: 36 % (ref 36.0–46.0)
Hemoglobin: 12.2 g/dL (ref 12.0–15.0)
MCH: 27.1 pg (ref 26.0–34.0)
MCHC: 33.9 g/dL (ref 30.0–36.0)
MCV: 80 fL (ref 78.0–100.0)
Platelets: 144 10*3/uL — ABNORMAL LOW (ref 150–400)
RBC: 4.5 MIL/uL (ref 3.87–5.11)
RDW: 14.6 % (ref 11.5–15.5)
WBC: 10.3 10*3/uL (ref 4.0–10.5)

## 2015-02-01 LAB — GLUCOSE, CAPILLARY
GLUCOSE-CAPILLARY: 89 mg/dL (ref 65–99)
Glucose-Capillary: 106 mg/dL — ABNORMAL HIGH (ref 65–99)
Glucose-Capillary: 116 mg/dL — ABNORMAL HIGH (ref 65–99)
Glucose-Capillary: 115 mg/dL — ABNORMAL HIGH (ref 65–99)

## 2015-02-01 MED ORDER — SODIUM CHLORIDE 0.9 % IV SOLN
INTRAVENOUS | Status: DC
  Administered 2015-02-01: 16:00:00 via INTRAVENOUS

## 2015-02-01 MED ORDER — CETYLPYRIDINIUM CHLORIDE 0.05 % MT LIQD
7.0000 mL | Freq: Two times a day (BID) | OROMUCOSAL | Status: DC
  Administered 2015-02-01 – 2015-02-02 (×3): 7 mL via OROMUCOSAL

## 2015-02-01 NOTE — Progress Notes (Signed)
Pt's pleurix drain was accessed and about 200 cc of serosanguineous fluid was drained. Pt tolerated it well. Per pt, to be done every other day. Will continue to monitor.

## 2015-02-01 NOTE — Progress Notes (Signed)
Utilization Review Completed.Asami Lambright T7/17/2016  

## 2015-02-01 NOTE — Progress Notes (Signed)
Patient Demographics:    April Kennedy, is a 81 y.o. female, DOB - 1933-12-10, QAS:[id number]  Admit date - 01/25/2015   Admitting Physician Toy Baker, MD  Outpatient Primary MD for the patient is Marlou Sa, ERIC, MD  LOS - 2   Chief Complaint  Patient presents with  . Toe Pain    The patient sais she went to [facility] and she was sent here to the ED.  The patient denies any injury to the toe but says she is in pain.  She said she started having pain and swelling.        Subjective:    April Kennedy today has, No headache, No chest pain, No abdominal pain - No Nausea, No new weakness tingling or numbness, No Cough - SOB. L>R foot pain   Assessment  & Plan :     1. Bilateral left more than right foot pain. Exam consistent with bilateral foot ischemia, vascular surgery and orthopedics on board, ABI and 2-D echo ordered, arteriogram per Dr. Doren Custard to be done on Monday. Case discussed with him and Dr Sharol Given. For now gentle hydration, continue aspirin added statin, continue vancomycin and Zosyn.     2. Recent diagnosis of stage IV metastatic adenocarcinoma with suspected primary in the lung. Metastases to the mediastinal lymph nodes. MRI brain recently noted which was unremarkable, mammogram done recently rules out breast cancer. She had a recurrent right-sided malignant pleural effusion for which she has a Pleurx catheter in place which will be continued. She follows with Dr. Sonny Dandy and will follow with him post discharge.   3. Chronic kidney disease stage IV. Baseline creatinine close to 1.9. Around baseline. Monitor.   4. Essential hypertension. Placed on hydralazine as needed.    5. DM type II. On sliding scale continue.  Lab Results  Component Value Date   HGBA1C 5.9 11/10/2011    CBG (last  3)   Recent Labs  01/31/15 1559 01/31/15 2307 02/01/15 0806  GLUCAP 119* 172* 106*      Code Status : Full  Family Communication  : None Present  Disposition Plan  : TBD  Consults  :  VVS, Ortho  Procedures  :   ABI  TTE  DVT Prophylaxis  :  Lovenox    Lab Results  Component Value Date   PLT 144* 02/01/2015    Inpatient Medications  Scheduled Meds: . antiseptic oral rinse  7 mL Mouth Rinse BID  . aspirin EC  81 mg Oral Daily  . atorvastatin  20 mg Oral q1800  . brimonidine  1 drop Both Eyes TID  . dorzolamide  1 drop Right Eye TID  . enoxaparin (LOVENOX) injection  30 mg Subcutaneous Q24H  . fentaNYL  25 mcg Transdermal Q72H  . insulin aspart  0-5 Units Subcutaneous QHS  . insulin aspart  0-9 Units Subcutaneous TID WC  . latanoprost  1 drop Both Eyes QHS  . piperacillin-tazobactam (ZOSYN)  IV  3.375 g Intravenous 3 times per day  . sodium chloride  3 mL Intravenous Q12H  . vancomycin  750 mg Intravenous Q24H   Continuous Infusions: . sodium chloride 50 mL/hr at 01/31/15 1145   PRN Meds:.acetaminophen **OR** acetaminophen, albuterol, hydrALAZINE, HYDROcodone-acetaminophen, ondansetron **OR** ondansetron (ZOFRAN)  IV  Antibiotics  :     Anti-infectives    Start     Dose/Rate Route Frequency Ordered Stop   01/31/15 2300  vancomycin (VANCOCIN) IVPB 750 mg/150 ml premix     750 mg 150 mL/hr over 60 Minutes Intravenous Every 24 hours 02/04/2015 2327     01/31/15 0600  piperacillin-tazobactam (ZOSYN) IVPB 3.375 g     3.375 g 12.5 mL/hr over 240 Minutes Intravenous 3 times per day 01/26/2015 2258     02/06/2015 2330  vancomycin (VANCOCIN) IVPB 1000 mg/200 mL premix     1,000 mg 200 mL/hr over 60 Minutes Intravenous STAT 02/11/2015 2327 01/31/15 0125   02/04/2015 2315  piperacillin-tazobactam (ZOSYN) IVPB 3.375 g     3.375 g 100 mL/hr over 30 Minutes Intravenous  Once 01/18/2015 2301 01/31/15 0056   01/24/2015 1830  clindamycin (CLEOCIN) IVPB 600 mg     600 mg 100  mL/hr over 30 Minutes Intravenous  Once 02/06/2015 1824 01/24/2015 2020        Objective:   Filed Vitals:   01/31/15 1658 01/31/15 2052 02/01/15 0049 02/01/15 0420  BP: 115/50 124/63 135/86 139/81  Pulse: 101 110 100 108  Temp:  97.9 F (36.6 C) 98.7 F (37.1 C) 98.4 F (36.9 C)  TempSrc:  Oral Oral Oral  Resp:  '18 18 18  '$ Height:      Weight:      SpO2:  98% 99% 98%    Wt Readings from Last 3 Encounters:  01/23/2015 84.732 kg (186 lb 12.8 oz)  01/27/15 82.4 kg (181 lb 10.5 oz)     Intake/Output Summary (Last 24 hours) at 02/01/15 1029 Last data filed at 02/01/15 9675  Gross per 24 hour  Intake 1273.33 ml  Output    490 ml  Net 783.33 ml     Physical Exam  Awake Alert, Oriented X 3, No new F.N deficits, Normal affect [place].AT,PERRAL Supple Neck,No JVD, No cervical lymphadenopathy appriciated.  Symmetrical Chest wall movement, Good air movement bilaterally, CTAB, R Pl. catheter RRR,No Gallops,Rubs or new Murmurs, No Parasternal Heave +ve B.Sounds, Abd Soft, No tenderness, No organomegaly appriciated, No rebound - guarding or rigidity. No Cyanosis, Clubbing or edema, No new Rash or bruise Bilat feet L>R appear cyanotic and dark, poor pulsations    Data Review:   Micro Results No results found for this or any previous visit (from the past 240 hour(s)).  Radiology Reports   Dg Chest 2 View  02/09/2015   CLINICAL DATA:  Chest pain  EXAM: CHEST  2 VIEW  COMPARISON:  PET-CT 01/23/2015, chest x-ray 01/27/2015  FINDINGS: There is a partially loculated right lower lobe small pleural effusion. There is a small left pleural effusion. There is right lower lung airspace disease unchanged compared with 01/27/2015. There is mild right lung interstitial thickening. There is a right-sided PleurX catheter present.  There is no pneumothorax. The heart size is stable. There is right hilar lymphadenopathy.  There is no acute osseous abnormality.  IMPRESSION: Small right pleural effusion with  right basilar airspace disease unchanged compared with 01/27/2015. This may reflect atelectasis versus pneumonia.   Electronically Signed   By: Kathreen Devoid   On: 02/15/2015 17:58     Mr Brain W Wo Contrast  02/15/2015   CLINICAL DATA:  Lung cancer with recurrent malignant right pleural effusion. Staging.  EXAM: MRI HEAD WITHOUT AND WITH CONTRAST  TECHNIQUE: Multiplanar, multiecho pulse sequences of the brain and surrounding structures were obtained without  and with intravenous contrast.  CONTRAST:  59m MULTIHANCE GADOBENATE DIMEGLUMINE 529 MG/ML IV SOLN  COMPARISON:  None.  FINDINGS: No acute infarct, hemorrhage, or mass lesion is present. Minimal periventricular and subcortical T2 changes bilaterally are within normal limits for age. The ventricles are of normal size. No significant extraaxial fluid collection is present.  Flow is present in the major intracranial arteries. A right lens replacement is noted. The globes and orbits are otherwise intact. The paranasal sinuses and mastoid air cells are clear.  The postcontrast images demonstrate no pathologic enhancement.  Skullbase is within normal limits. Midline images demonstrate degenerative changes in the upper cervical spine. A relatively empty sella is present. No focal lesions are evident.  IMPRESSION: 1. Normal MRI appearance the brain for age. No evidence for metastatic disease the brain or meninges. 2. Mild to moderate degenerative changes within the upper cervical spine.   Electronically Signed   By: CSan MorelleM.D.   On: 01/16/2015 13:00      Nm Pet Image Initial (pi) Skull Base To Thigh  01/18/2015   CLINICAL DATA:  Initial treatment strategy for lung adenocarcinoma.  EXAM: NUCLEAR MEDICINE PET SKULL BASE TO THIGH  TECHNIQUE: 9.0 mCi F-18 FDG was injected intravenously. Full-ring PET imaging was performed from the skull base to thigh after the radiotracer. CT data was obtained and used for attenuation correction and anatomic  localization.  FASTING BLOOD GLUCOSE:  Value: 170 mg/dl  COMPARISON:  CT 01/18/2015  FINDINGS: NECK  Hypermetabolic left supraclavicular lymph node measures 14 mm short axis on image 45 CT data set. This left supraclavicular node is intensely metabolic with SUV max equals 7.9.  CHEST  There is a rind of intensely hypermetabolic pleural thickening within the right hemi thorax. This hypermetabolic thickening over the diaphragm is intense with SUV max equal 8.1. Hypermetabolic thickening extends medially within the right hemi thorax and the right hilum. This hypermetabolic thickening appears to be on the parietal and pleural surface.  There are discrete nodules within the right lower lobe which are not as metabolic as the pleural thickening. Largest nodule measures 29 mm on image 78, series 4.  Extensive hypermetabolic mediastinal lymphadenopathy. A precarinal lymph node measures 17 mm with SUV max equals 6.8. Subcarinal lymph node measures 30 mm would equal intensity. Prevascular lymph nodes also noted.  ABDOMEN/PELVIS  No abnormal metabolic activity within the liver. Mild activity associated with the left adrenal gland similar to liver activity and therefore likely benign. No measurable lesion. No hypermetabolic abdominal pelvic adenopathy.  SKELETON  No focal hypermetabolic activity to suggest skeletal metastasis.  IMPRESSION: 1. Rind of intensely hypermetabolic pleural thickening within the entirety of the right hemi thorax is concerning for trans pleural spread of lung carcinoma. 2. No clear primary lesion identified within the right lung. Nodules in the inferior right lower lobe and right middle lobe are not significant metabolic compared to the pleural thickening. 3. Extensive hypermetabolic nodal metastasis within the mediastinum. 4. Contralateral left super clavicular hypermetabolic nodal metastasis. 5. No evidence metastasis below the diaphragm. No skeletal metastasis   Electronically Signed   By: SSuzy BouchardM.D.   On: 01/26/2015 09:08      Dg Foot Complete Right  01/23/2015   CLINICAL DATA:  Pain  EXAM: RIGHT FOOT COMPLETE - 3+ VIEW  COMPARISON:  None.  FINDINGS: Three views of the right foot submitted. There is diffuse osteopenia. Mild hallux valgus deformity. Degenerative changes first metatarsal phalangeal joint. Mild degenerative changes distal  aspect first metatarsal. Dorsal spurring metatarsal region.  IMPRESSION: No acute fracture or subluxation. Mild hallux valgus deformity. Degenerative changes first metatarsal phalangeal joint and distal aspect of first metatarsal. Mild soft tissue swelling dorsal metatarsal region.   Electronically Signed   By: Lahoma Crocker M.D.   On: 02/10/2015 17:55       CBC  Recent Labs Lab 02/15/2015 1700 01/31/15 0525 02/01/15 0335  WBC 12.2* 11.7* 10.3  HGB 12.9 11.9* 12.2  HCT 38.0 35.5* 36.0  PLT 127* 130* 144*  MCV 78.5 78.5 80.0  MCH 26.7 26.3 27.1  MCHC 33.9 33.5 33.9  RDW 14.2 14.3 14.6  LYMPHSABS 1.8  --   --   MONOABS 1.2*  --   --   EOSABS 0.2  --   --   BASOSABS 0.0  --   --     Chemistries   Recent Labs Lab 01/27/15 0510 02/08/2015 1700 01/31/15 0525 02/01/15 0335  NA 140 138 137 137  K 4.8 4.1 4.1 4.4  CL 107 102 105 102  CO2 '26 24 23 24  '$ GLUCOSE 114* 153* 168* 130*  BUN 28* '19 18 18  '$ CREATININE 1.86* 1.86* 1.76* 1.90*  CALCIUM 8.3* 8.6* 8.3* 8.1*  MG  --   --  2.1  --   AST  --  33 28  --   ALT  --  33 31  --   ALKPHOS  --  59 53  --   BILITOT  --  0.7 0.6  --    ------------------------------------------------------------------------------------------------------------------ estimated creatinine clearance is 25.7 mL/min (by C-G formula based on Cr of 1.9). ------------------------------------------------------------------------------------------------------------------ No results for input(s): HGBA1C in the last 72  hours. ------------------------------------------------------------------------------------------------------------------ No results for input(s): CHOL, HDL, LDLCALC, TRIG, CHOLHDL, LDLDIRECT in the last 72 hours. ------------------------------------------------------------------------------------------------------------------  Recent Labs  01/31/15 0525  TSH 0.548   ------------------------------------------------------------------------------------------------------------------ No results for input(s): VITAMINB12, FOLATE, FERRITIN, TIBC, IRON, RETICCTPCT in the last 72 hours.  Coagulation profile  Recent Labs Lab 01/26/15 0256  INR 1.18    No results for input(s): DDIMER in the last 72 hours.  Cardiac Enzymes No results for input(s): CKMB, TROPONINI, MYOGLOBIN in the last 168 hours.  Invalid input(s): CK ------------------------------------------------------------------------------------------------------------------ Invalid input(s): POCBNP   Time Spent in minutes   35   Lala Lund K M.D on 02/01/2015 at 10:29 AM  Between 7am to 7pm - Pager - [phone]  After 7pm go to [email or web address] - password [facility]  [facility]   Office  [phone]

## 2015-02-01 NOTE — Progress Notes (Signed)
   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 She has evidence of both aortoiliac disease and infrainguinal arterial occlusive disease. She has renal insufficiency and lesser creatinine has improved significantly by tomorrow we will use CO2. The resolution with this is not ideal however I'm reluctant to use much contrast given her renal insufficiency. Will make further recommendations pending the results of her arteriogram. I have reviewed with the patient the indications for arteriography. In addition, I have reviewed the potential complications of arteriography including but not limited to: Bleeding, arterial injury, arterial thrombosis, dye action, renal insufficiency, or other unpredictable medical problems. I have explained to the patient that if we find disease amenable to angioplasty we could potentially address this at the same time. I have discussed the potential complications of angioplasty and stenting, including but not limited to: Bleeding, arterial thrombosis, arterial injury, dissection, or the need for surgical intervention.  SUBJECTIVE: still complains of rest pain in the left foot.  PHYSICAL EXAM: Filed Vitals:   01/31/15 1658 01/31/15 2052 02/01/15 0049 02/01/15 0420  BP: 115/50 124/63 135/86 139/81  Pulse: 101 110 100 108  Temp:  97.9 F (36.6 C) 98.7 F (37.1 C) 98.4 F (36.9 C)  TempSrc:  Oral Oral Oral  Resp:  '18 18 18  '$ Height:      Weight:      SpO2:  98% 99% 98%   Palpable right femoral pulse. No left femoral pulse. No change in left foot.  LABS: Lab Results  Component Value Date   WBC 10.3 02/01/2015   HGB 12.2 02/01/2015   HCT 36.0 02/01/2015   MCV 80.0 02/01/2015   PLT 144* 02/01/2015   Lab Results  Component Value Date   CREATININE 1.90* 02/01/2015   Lab Results  Component Value Date   INR 1.18 01/26/2015   CBG (last 3)   Recent Labs  01/31/15 1237 01/31/15 1559 01/31/15 2307  GLUCAP 124* 119* 172*    Active Problems:   Pleural effusion, right   Essential hypertension   Chronic kidney disease, stage 3   Left leg cellulitis   Cellulitis   Foot pain, left   Malignant pleural effusion   Gae Gallop Beeper: [phone] 02/01/2015

## 2015-02-02 ENCOUNTER — Other Ambulatory Visit (HOSPITAL_COMMUNITY): Payer: Medicare Other

## 2015-02-02 ENCOUNTER — Encounter (HOSPITAL_COMMUNITY): Payer: Self-pay | Admitting: Vascular Surgery

## 2015-02-02 ENCOUNTER — Telehealth: Payer: Self-pay

## 2015-02-02 ENCOUNTER — Encounter (HOSPITAL_COMMUNITY): Admission: EM | Disposition: E | Payer: Self-pay | Source: Home / Self Care | Attending: Internal Medicine

## 2015-02-02 ENCOUNTER — Ambulatory Visit: Payer: Medicare Other

## 2015-02-02 ENCOUNTER — Inpatient Hospital Stay: Payer: Medicare Other | Admitting: Hematology and Oncology

## 2015-02-02 HISTORY — PX: PERIPHERAL VASCULAR CATHETERIZATION: SHX172C

## 2015-02-02 LAB — GLUCOSE, CAPILLARY
GLUCOSE-CAPILLARY: 116 mg/dL — AB (ref 65–99)
GLUCOSE-CAPILLARY: 89 mg/dL (ref 65–99)
GLUCOSE-CAPILLARY: 175 mg/dL — AB (ref 65–99)
Glucose-Capillary: 104 mg/dL — ABNORMAL HIGH (ref 65–99)

## 2015-02-02 LAB — HEMOGLOBIN A1C
HEMOGLOBIN A1C: 7.4 % — AB (ref 4.8–5.6)
MEAN PLASMA GLUCOSE: 166 mg/dL

## 2015-02-02 SURGERY — ABDOMINAL AORTOGRAM

## 2015-02-02 MED ORDER — ONDANSETRON HCL 4 MG/2ML IJ SOLN
4.0000 mg | Freq: Four times a day (QID) | INTRAMUSCULAR | Status: DC | PRN
  Administered 2015-02-03: 4 mg via INTRAVENOUS

## 2015-02-02 MED ORDER — SODIUM CHLORIDE 0.9 % IV SOLN
INTRAVENOUS | Status: DC
  Administered 2015-02-02: 16:00:00 via INTRAVENOUS

## 2015-02-02 MED ORDER — METOPROLOL TARTRATE 25 MG PO TABS
25.0000 mg | ORAL_TABLET | Freq: Two times a day (BID) | ORAL | Status: DC
  Administered 2015-02-02 – 2015-02-06 (×8): 25 mg via ORAL
  Filled 2015-02-02 (×13): qty 1

## 2015-02-02 MED ORDER — HEPARIN (PORCINE) IN NACL 2-0.9 UNIT/ML-% IJ SOLN
INTRAMUSCULAR | Status: DC | PRN
  Administered 2015-02-02: 10:00:00

## 2015-02-02 MED ORDER — LIDOCAINE HCL (PF) 1 % IJ SOLN
INTRAMUSCULAR | Status: AC
  Filled 2015-02-02: qty 30

## 2015-02-02 MED ORDER — FENTANYL CITRATE (PF) 100 MCG/2ML IJ SOLN
INTRAMUSCULAR | Status: AC
  Filled 2015-02-02: qty 2

## 2015-02-02 MED ORDER — HEPARIN (PORCINE) IN NACL 2-0.9 UNIT/ML-% IJ SOLN
INTRAMUSCULAR | Status: AC
  Filled 2015-02-02: qty 1000

## 2015-02-02 MED ORDER — MIDAZOLAM HCL 2 MG/2ML IJ SOLN
INTRAMUSCULAR | Status: DC | PRN
Start: 2015-02-02 — End: 2015-02-02
  Administered 2015-02-02: 1 mg via INTRAVENOUS

## 2015-02-02 MED ORDER — HEPARIN (PORCINE) IN NACL 100-0.45 UNIT/ML-% IJ SOLN
1300.0000 [IU]/h | INTRAMUSCULAR | Status: DC
Start: 2015-02-02 — End: 2015-02-03
  Administered 2015-02-02: 1050 [IU]/h via INTRAVENOUS
  Filled 2015-02-02: qty 250

## 2015-02-02 MED ORDER — OXYCODONE HCL 5 MG PO TABS
5.0000 mg | ORAL_TABLET | Freq: Once | ORAL | Status: AC
  Administered 2015-02-02: 5 mg via ORAL
  Filled 2015-02-02: qty 1

## 2015-02-02 MED ORDER — OXYCODONE-ACETAMINOPHEN 5-325 MG PO TABS
1.0000 | ORAL_TABLET | ORAL | Status: DC | PRN
  Administered 2015-02-02 – 2015-02-03 (×2): 2 via ORAL
  Filled 2015-02-02 (×2): qty 2

## 2015-02-02 MED ORDER — MIDAZOLAM HCL 2 MG/2ML IJ SOLN
INTRAMUSCULAR | Status: AC
  Filled 2015-02-02: qty 2

## 2015-02-02 MED ORDER — FENTANYL CITRATE (PF) 100 MCG/2ML IJ SOLN
INTRAMUSCULAR | Status: DC | PRN
  Administered 2015-02-02 (×2): 50 ug via INTRAVENOUS

## 2015-02-02 MED ORDER — ACETAMINOPHEN 325 MG PO TABS: 650.0000 mg | ORAL_TABLET | ORAL | Status: DC | PRN

## 2015-02-02 MED ORDER — IODIXANOL 320 MG/ML IV SOLN
INTRAVENOUS | Status: DC | PRN
  Administered 2015-02-02: 20 mL via INTRAVENOUS

## 2015-02-02 SURGICAL SUPPLY — 16 items
CATH ANGIO 5F PIGTAIL 65CM (CATHETERS) ×3 IMPLANT
CATH CROSS OVER TEMPO 5F (CATHETERS) ×3 IMPLANT
CATH STRAIGHT 5FR 65CM (CATHETERS) ×3 IMPLANT
COVER PRB 48X5XTLSCP FOLD TPE (BAG) ×1 IMPLANT
COVER PROBE 5X48 (BAG) ×2
FILTER CO2 0.2 MICRON (VASCULAR PRODUCTS) ×3 IMPLANT
KIT MICROINTRODUCER STIFF 5F (SHEATH) ×3 IMPLANT
KIT PV (KITS) ×3 IMPLANT
RESERVOIR CO2 (VASCULAR PRODUCTS) ×3 IMPLANT
SET FLUSH CO2 (MISCELLANEOUS) ×3 IMPLANT
SHEATH PINNACLE 5F 10CM (SHEATH) ×6 IMPLANT
SYR MEDRAD MARK V 150ML (SYRINGE) ×3 IMPLANT
TRANSDUCER W/STOPCOCK (MISCELLANEOUS) ×3 IMPLANT
TRAY PV CATH (CUSTOM PROCEDURE TRAY) ×3 IMPLANT
TUBING CIL FLEX 10 FLL-RA (TUBING) ×3 IMPLANT
WIRE HITORQ VERSACORE ST 145CM (WIRE) ×3 IMPLANT

## 2015-02-02 NOTE — Interval H&P Note (Signed)
History and Physical Interval Note:  01/16/2015 9:54 AM  April Kennedy  has presented today for surgery, with the diagnosis of pad  The various methods of treatment have been discussed with the patient and family. After consideration of risks, benefits and other options for treatment, the patient has consented to  Procedure(s): Abdominal Aortogram (N/A) as a surgical intervention .  The patient's history has been reviewed, patient examined, no change in status, stable for surgery.  I have reviewed the patient's chart and labs.  Questions were answered to the patient's satisfaction.     Deitra Mayo

## 2015-02-02 NOTE — Op Note (Signed)
NAME: April Kennedy   MRN: [id number] DOB: 19-Jan-1934    DATE OF OPERATION: 01/31/2015  PREOP DIAGNOSIS: Atherosclerosis with rest pain left leg  POSTOP DIAGNOSIS: same  PROCEDURE:  1. Ultrasound-guided access to right common femoral artery 2. Aortogram with bilateral iliac arteriogram using CO2 3. Selective catheterization of the left external iliac artery with left lower extremity runoff using CO2 and 20 cc IV contrast.  SURGEON: Judeth Cornfield. Scot Dock, MD, FACS  ANESTHESIA: Local with sedation   EBL: minimal  INDICATIONS: April Kennedy is a 81 y.o. female who presented to the emergency department with an ingrown toenail on her right foot with right leg swelling but also was complaining of rest pain of her left foot. She had evidence of multilevel arterial occlusive disease. She presents for an arteriogram. She has renal insufficiency and therefore  the arteriogram was done with CO2.   TECHNIQUE: The patient was taken to the peripheral vascular lab and received 1 mg of Versed and 50 g of fentanyl. Both groins were prepped and draped in usual sterile fashion. Under ultrasound guidance, after the skin was anesthetized, the right common femoral artery was cannulated with a micropuncture needle and a micropuncture sheath introduced over a wire. This was exchanged for a 5 French sheath over a versa core wire. A pigtail catheter was positioned at the L1 vertebral body and flush aortogram obtained. The cath was in position above the aortic bifurcation and oblique iliac projection was obtained. The pigtail catheter was exchanged for a crossover catheter which was positioned into the proximal left common iliac artery. The wire was advanced into the external iliac artery and the crossover catheter exchanged for a straight catheter. Selective left external iliac arterial grams obtained using CO2 and for a spot film of the left leg 20 cc of IV contrast.  FINDINGS:  1. There are single renal  arteries bilaterally with no significant renal artery stenosis identified. The infrarenal aorta is widely patent with some plaque in the mid segment of the infrarenal aorta. Bilateral common iliac arteries, internal iliac arteries, and external iliac arteries are patent. 2. On the left side, which is the site of concern, there is extensive plaque in the left common femoral artery. The superficial femoral artery and deep femoral artery are patent. The popliteal artery is patent. The proximal anterior tibial, posterior tibial, and peroneal arteries are patent. The posterior tibial artery appears to be occluded in the distal third of the leg. The peroneal arteries occluded in the mid leg. The anterior tibial artery is poorly visualized distally. 3. On the right side, the common femoral, deep femoral, and proximal superficial femoral artery are patent.  There is a moderate stenosis in the right superficial femoral artery in the mid thigh. Below that the popliteal, anterior tibial, posterior tibial, and arteries are patent proximally. There is poor visualization distally.  CLINICAL NOTE: Although visualization is not ideal as the study was done mostly with CO2, it does appear that there is a significant stenosis in the left common femoral artery. I have recommended that we dressed this with endarterectomy and patch angioplasty. The foot has progressed significantly. There is evidence of severe tibial disease distally which may either be possibly embolic related to the proximal common femoral artery stenosis or possibly this represents chronic tibial artery occlusive disease. I have recommended at the time of surgery tomorrow we would also attempt tibial embolectomy in hopes that this might be something that could be addressed. If this represents  chronic tibial artery occlusive disease then there would be no further options. Regardless, without attempted revascularization and think she would require proximal  amputation. I have discussed the planned procedure with both the patient and her daughter. We have discussed the indications and the potential complications. All of their questions were answered and they're agreeable to proceed tomorrow.  Deitra Mayo, MD, FACS [facility]  DATE OF DICTATION:   01/24/2015

## 2015-02-02 NOTE — Progress Notes (Signed)
ANTICOAGULATION CONSULT NOTE - Follow Up Consult  Pharmacy Consult for Heparin; Vancomcyin Indication: left leg ischemia and cellulitis  Allergies  Allergen Reactions  . Sulfa Antibiotics     Hives/ itching     Patient Measurements: Height: '5\' 4"'$  (162.6 cm) Weight: 186 lb 12.8 oz (84.732 kg) IBW/kg (Calculated) : 54.7 Heparin Dosing Weight: 74 kg  Vital Signs: Temp: 98.6 F (37 C) (07/18 1449) Temp Source: Oral (07/18 1449) BP: 162/64 mmHg (07/18 1449) Pulse Rate: 124 (07/18 1449)  Labs:  Recent Labs  01/23/2015 1700 01/31/15 0525 02/01/15 0335  HGB 12.9 11.9* 12.2  HCT 38.0 35.5* 36.0  PLT 127* 130* 144*  CREATININE 1.86* 1.76* 1.90*    Estimated Creatinine Clearance: 25.7 mL/min (by C-G formula based on Cr of 1.9).  Assessment:  s/p arteriogram today. For vascular surgery in am.  Heparin to begin 6 hrs after sheath out. Sheath out ~2pm. Groin site noted without bleeding or hematoma.  Heparin to stop at 4am on 7/19 for OR at 7:15 am.   Had been on Lovenox for VTE prophylaxis. Last dose 7/17 10am.    Day # 3 Vanc and Zosyn for cellulitis.  Creatinine about the same.  20 ml contrast given during procedure. NS at 50 ml/hr.     Goal of Therapy:  Heparin level 0.3-0.7 units/ml Monitor platelets by anticoagulation protocol: Yes  Vancomycin trough levels 10-15 mcg/ml   Plan:   Heparin drip to begin at 8pm tonight at 1050 units/hr.  Heparin level at 2am on 7/19.  Drip to be off at 4am on 7/19.   Continue Vancomycin 750 mg IV q24hrs.  Also on Zosyn 3.375 gm IV q8hrs (each over 4 hrs)  Follow renal function, progress.  Will follow up post-op for antibiotic and anticoagulation plans.  Arty Baumgartner, [place] Pager: [phone] 02/04/2015,4:03 PM

## 2015-02-02 NOTE — Progress Notes (Addendum)
Patient going for Arteriography; alert and oriented x4; complaining of pain BLE 7/10.

## 2015-02-02 NOTE — Progress Notes (Signed)
Site area: RFA Site Prior to Removal:  Level 0 Pressure Applied For:20 min Manual:  yes  Patient Status During Pull:  stable Post Pull Site:  Level Post Pull Instructions Given:  yes Post Pull Pulses Present: doppler Dressing Applied:  clear Bedrest begins @ 1205 pm Comments:

## 2015-02-02 NOTE — Clinical Social Work Note (Signed)
CSW received referral for advance directives. CSW left message for spiritual care department regarding consult.    Liz Beach MSW, [place], [place], [phone]

## 2015-02-02 NOTE — Progress Notes (Signed)
01/25/2015 1430 Received pt to room 27 from cath lab.  Tele monitor applied and CCMD notified.  Pt with c/o pain in the left foot.  Will medicate.  Right and Left groin sites are level 0.  Instructed pt. On bedrest until 6pm this evening.  Oriented to room, call light and bed.  Call bell in reach.  Family at bedside. [place]

## 2015-02-02 NOTE — H&P (View-Only) (Signed)
   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 She has evidence of both aortoiliac disease and infrainguinal arterial occlusive disease. She has renal insufficiency and lesser creatinine has improved significantly by tomorrow we will use CO2. The resolution with this is not ideal however I'm reluctant to use much contrast given her renal insufficiency. Will make further recommendations pending the results of her arteriogram. I have reviewed with the patient the indications for arteriography. In addition, I have reviewed the potential complications of arteriography including but not limited to: Bleeding, arterial injury, arterial thrombosis, dye action, renal insufficiency, or other unpredictable medical problems. I have explained to the patient that if we find disease amenable to angioplasty we could potentially address this at the same time. I have discussed the potential complications of angioplasty and stenting, including but not limited to: Bleeding, arterial thrombosis, arterial injury, dissection, or the need for surgical intervention.  SUBJECTIVE: still complains of rest pain in the left foot.  PHYSICAL EXAM: Filed Vitals:   01/31/15 1658 01/31/15 2052 02/01/15 0049 02/01/15 0420  BP: 115/50 124/63 135/86 139/81  Pulse: 101 110 100 108  Temp:  97.9 F (36.6 C) 98.7 F (37.1 C) 98.4 F (36.9 C)  TempSrc:  Oral Oral Oral  Resp:  '18 18 18  '$ Height:      Weight:      SpO2:  98% 99% 98%   Palpable right femoral pulse. No left femoral pulse. No change in left foot.  LABS: Lab Results  Component Value Date   WBC 10.3 02/01/2015   HGB 12.2 02/01/2015   HCT 36.0 02/01/2015   MCV 80.0 02/01/2015   PLT 144* 02/01/2015   Lab Results  Component Value Date   CREATININE 1.90* 02/01/2015   Lab Results  Component Value Date   INR 1.18 01/26/2015   CBG (last 3)   Recent Labs  01/31/15 1237 01/31/15 1559 01/31/15 2307  GLUCAP 124* 119* 172*    Active Problems:   Pleural effusion, right   Essential hypertension   Chronic kidney disease, stage 3   Left leg cellulitis   Cellulitis   Foot pain, left   Malignant pleural effusion   Gae Gallop Beeper: [phone] 02/01/2015

## 2015-02-02 NOTE — Progress Notes (Signed)
Site area: LFV Site Prior to Removal:  Level  0 Pressure Applied For:15 min Manual:  Yes   Patient Status During Pull:  stable Post Pull Site:  Level 0 Post Pull Instructions Given:  yes Post Pull Pulses Present: doppler Dressing Applied:  clear Bedrest begins @ 2929 Comments:

## 2015-02-02 NOTE — Telephone Encounter (Signed)
Let Cassonya know Dr. Lindi Adie will see pt at [facility] about 4 pm tomorrow.  She voiced understanding.

## 2015-02-02 NOTE — Progress Notes (Signed)
Patient Demographics:    April Kennedy, is a 81 y.o. female, DOB - 08-17-1933, WUX:[id number]  Admit date - 02/01/2015   Admitting Physician Toy Baker, MD  Outpatient Primary MD for the patient is Marlou Sa, ERIC, MD  LOS - 3   Chief Complaint  Patient presents with  . Toe Pain    The patient sais she went to [facility] and she was sent here to the ED.  The patient denies any injury to the toe but says she is in pain.  She said she started having pain and swelling.        Subjective:    April Kennedy today has, No headache, No chest pain, No abdominal pain - No Nausea, No new weakness tingling or numbness, No Cough - SOB. L>R foot pain   Assessment  & Plan :     1. Bilateral left more than right foot pain. Exam consistent with bilateral foot ischemia, vascular surgery and orthopedics on board, arteriogram per Dr. Doren Custard to be done 01/17/2015. Case discussed with him and Dr Sharol Given. For now gentle hydration, continue aspirin added statin, continue vancomycin and Zosyn. Left foot looks much worse than right. Continue pain control.    2. Recent diagnosis of stage IV metastatic adenocarcinoma with suspected primary in the lung. Metastases to the mediastinal lymph nodes. MRI brain recently noted which was unremarkable, mammogram done recently rules out breast cancer. She had a recurrent right-sided malignant pleural effusion for which she has a Pleurx catheter in place which will be continued. She follows with Dr. Sonny Dandy and will follow with him post discharge.   3. Chronic kidney disease stage IV. Baseline creatinine close to 1.9. Around baseline. Monitor.   4. Essential hypertension. Placed on hydralazine as needed, will add B blocker for better baseline BP.    5. DM type II. On sliding scale  continue.  Lab Results  Component Value Date   HGBA1C 5.9 11/10/2011    CBG (last 3)   Recent Labs  02/01/15 1633 02/01/15 2159 01/29/2015 0805  GLUCAP 116* 115* 116*      Code Status : Full  Family Communication  : None Present  Disposition Plan  : TBD  Consults  :  VVS, Ortho  Procedures  :   ABI   TTE 01-17-15  - Left ventricle: The cavity size was normal. Wall thickness wasnormal. Systolic function was normal. The estimated ejectionfraction was in the range of 55% to 60%. Wall motion was normal; there were no regional wall motion abnormalities. Dopplerparameters are consistent with abnormal left ventricularrelaxation (grade 1 diastolic dysfunction). - Atrial septum: No defect or patent foramen ovale was identified. - Pulmonary arteries: Systolic pressure was mildly increased. PA peak pressure: 36 mm Hg (S).   DVT Prophylaxis  :  Lovenox    Lab Results  Component Value Date   PLT 144* 02/01/2015    Inpatient Medications  Scheduled Meds: . antiseptic oral rinse  7 mL Mouth Rinse BID  . aspirin EC  81 mg Oral Daily  . atorvastatin  20 mg Oral q1800  . brimonidine  1 drop Both Eyes TID  . dorzolamide  1 drop Right Eye TID  . enoxaparin (LOVENOX) injection  30 mg Subcutaneous Q24H  . fentaNYL  25 mcg Transdermal Q72H  . insulin aspart  0-5 Units Subcutaneous QHS  . insulin aspart  0-9 Units Subcutaneous TID WC  . latanoprost  1 drop Both Eyes QHS  . piperacillin-tazobactam (ZOSYN)  IV  3.375 g Intravenous 3 times per day  . sodium chloride  3 mL Intravenous Q12H  . vancomycin  750 mg Intravenous Q24H   Continuous Infusions: . sodium chloride 50 mL/hr at 02/01/15 1607   PRN Meds:.acetaminophen **OR** acetaminophen, albuterol, cath procedure set-up drugs (heparinized saline/lidocaine/nitro), hydrALAZINE, HYDROcodone-acetaminophen, ondansetron **OR** ondansetron (ZOFRAN) IV  Antibiotics  :     Anti-infectives    Start     Dose/Rate Route Frequency  Ordered Stop   01/31/15 2300  vancomycin (VANCOCIN) IVPB 750 mg/150 ml premix     750 mg 150 mL/hr over 60 Minutes Intravenous Every 24 hours 01/22/2015 2327     01/31/15 0600  piperacillin-tazobactam (ZOSYN) IVPB 3.375 g     3.375 g 12.5 mL/hr over 240 Minutes Intravenous 3 times per day 01/21/2015 2258     01/24/2015 2330  vancomycin (VANCOCIN) IVPB 1000 mg/200 mL premix     1,000 mg 200 mL/hr over 60 Minutes Intravenous STAT 01/17/2015 2327 01/31/15 0125   02/01/2015 2315  piperacillin-tazobactam (ZOSYN) IVPB 3.375 g     3.375 g 100 mL/hr over 30 Minutes Intravenous  Once 01/17/2015 2301 01/31/15 0056   02/08/2015 1830  clindamycin (CLEOCIN) IVPB 600 mg     600 mg 100 mL/hr over 30 Minutes Intravenous  Once 01/22/2015 1824 01/29/2015 2020        Objective:   Filed Vitals:   02/01/15 1632 02/01/15 2156 01/22/2015 0200 01/23/2015 0434  BP: 129/49 131/61 122/64 140/82  Pulse: 104 110 100 99  Temp: 98.6 F (37 C) 99.5 F (37.5 C) 98.8 F (37.1 C) 98.9 F (37.2 C)  TempSrc: Oral Oral Oral Oral  Resp: '18 16 18 19  '$ Height:      Weight:      SpO2: 97% 97% 98% 98%    Wt Readings from Last 3 Encounters:  01/22/2015 84.732 kg (186 lb 12.8 oz)  01/27/15 82.4 kg (181 lb 10.5 oz)     Intake/Output Summary (Last 24 hours) at 01/29/2015 1027 Last data filed at 01/30/2015 0647  Gross per 24 hour  Intake    900 ml  Output    600 ml  Net    300 ml     Physical Exam  Awake Alert, Oriented X 3, No new F.N deficits, Normal affect [place].AT,PERRAL Supple Neck,No JVD, No cervical lymphadenopathy appriciated.  Symmetrical Chest wall movement, Good air movement bilaterally, CTAB, R Pl. catheter RRR,No Gallops,Rubs or new Murmurs, No Parasternal Heave +ve B.Sounds, Abd Soft, No tenderness, No organomegaly appriciated, No rebound - guarding or rigidity. No Cyanosis, Clubbing or edema, No new Rash or bruise Bilat feet L>R appear cyanotic and dark, poor pulsations    Data Review:   Micro Results No results  found for this or any previous visit (from the past 240 hour(s)).  Radiology Reports   Dg Chest 2 View  01/23/2015   CLINICAL DATA:  Chest pain  EXAM: CHEST  2 VIEW  COMPARISON:  PET-CT 01/25/2015, chest x-ray 01/27/2015  FINDINGS: There is a partially loculated right lower lobe small pleural effusion. There is a small left pleural effusion. There is right lower lung airspace disease unchanged compared with 01/27/2015. There is mild right lung interstitial thickening. There is a right-sided PleurX catheter present.  There is  no pneumothorax. The heart size is stable. There is right hilar lymphadenopathy.  There is no acute osseous abnormality.  IMPRESSION: Small right pleural effusion with right basilar airspace disease unchanged compared with 01/27/2015. This may reflect atelectasis versus pneumonia.   Electronically Signed   By: Kathreen Devoid   On: 02/10/2015 17:58     Mr Brain W Wo Contrast  01/31/2015   CLINICAL DATA:  Lung cancer with recurrent malignant right pleural effusion. Staging.  EXAM: MRI HEAD WITHOUT AND WITH CONTRAST  TECHNIQUE: Multiplanar, multiecho pulse sequences of the brain and surrounding structures were obtained without and with intravenous contrast.  CONTRAST:  45m MULTIHANCE GADOBENATE DIMEGLUMINE 529 MG/ML IV SOLN  COMPARISON:  None.  FINDINGS: No acute infarct, hemorrhage, or mass lesion is present. Minimal periventricular and subcortical T2 changes bilaterally are within normal limits for age. The ventricles are of normal size. No significant extraaxial fluid collection is present.  Flow is present in the major intracranial arteries. A right lens replacement is noted. The globes and orbits are otherwise intact. The paranasal sinuses and mastoid air cells are clear.  The postcontrast images demonstrate no pathologic enhancement.  Skullbase is within normal limits. Midline images demonstrate degenerative changes in the upper cervical spine. A relatively empty sella is present. No  focal lesions are evident.  IMPRESSION: 1. Normal MRI appearance the brain for age. No evidence for metastatic disease the brain or meninges. 2. Mild to moderate degenerative changes within the upper cervical spine.   Electronically Signed   By: CSan MorelleM.D.   On: 01/29/2015 13:00      Nm Pet Image Initial (pi) Skull Base To Thigh  02/14/2015   CLINICAL DATA:  Initial treatment strategy for lung adenocarcinoma.  EXAM: NUCLEAR MEDICINE PET SKULL BASE TO THIGH  TECHNIQUE: 9.0 mCi F-18 FDG was injected intravenously. Full-ring PET imaging was performed from the skull base to thigh after the radiotracer. CT data was obtained and used for attenuation correction and anatomic localization.  FASTING BLOOD GLUCOSE:  Value: 170 mg/dl  COMPARISON:  CT 01/18/2015  FINDINGS: NECK  Hypermetabolic left supraclavicular lymph node measures 14 mm short axis on image 45 CT data set. This left supraclavicular node is intensely metabolic with SUV max equals 7.9.  CHEST  There is a rind of intensely hypermetabolic pleural thickening within the right hemi thorax. This hypermetabolic thickening over the diaphragm is intense with SUV max equal 8.1. Hypermetabolic thickening extends medially within the right hemi thorax and the right hilum. This hypermetabolic thickening appears to be on the parietal and pleural surface.  There are discrete nodules within the right lower lobe which are not as metabolic as the pleural thickening. Largest nodule measures 29 mm on image 78, series 4.  Extensive hypermetabolic mediastinal lymphadenopathy. A precarinal lymph node measures 17 mm with SUV max equals 6.8. Subcarinal lymph node measures 30 mm would equal intensity. Prevascular lymph nodes also noted.  ABDOMEN/PELVIS  No abnormal metabolic activity within the liver. Mild activity associated with the left adrenal gland similar to liver activity and therefore likely benign. No measurable lesion. No hypermetabolic abdominal pelvic  adenopathy.  SKELETON  No focal hypermetabolic activity to suggest skeletal metastasis.    IMPRESSION: 1. Rind of intensely hypermetabolic pleural thickening within the entirety of the right hemi thorax is concerning for trans pleural spread of lung carcinoma. 2. No clear primary lesion identified within the right lung. Nodules in the inferior right lower lobe and right middle lobe are not  significant metabolic compared to the pleural thickening. 3. Extensive hypermetabolic nodal metastasis within the mediastinum. 4. Contralateral left super clavicular hypermetabolic nodal metastasis. 5. No evidence metastasis below the diaphragm. No skeletal metastasis   Electronically Signed   By: Suzy Bouchard M.D.   On: 01/16/2015 09:08      Dg Foot Complete Right  01/29/2015   CLINICAL DATA:  Pain  EXAM: RIGHT FOOT COMPLETE - 3+ VIEW  COMPARISON:  None.  FINDINGS: Three views of the right foot submitted. There is diffuse osteopenia. Mild hallux valgus deformity. Degenerative changes first metatarsal phalangeal joint. Mild degenerative changes distal aspect first metatarsal. Dorsal spurring metatarsal region.  IMPRESSION: No acute fracture or subluxation. Mild hallux valgus deformity. Degenerative changes first metatarsal phalangeal joint and distal aspect of first metatarsal. Mild soft tissue swelling dorsal metatarsal region.   Electronically Signed   By: Lahoma Crocker M.D.   On: 01/17/2015 17:55       CBC  Recent Labs Lab 02/06/2015 1700 01/31/15 0525 02/01/15 0335  WBC 12.2* 11.7* 10.3  HGB 12.9 11.9* 12.2  HCT 38.0 35.5* 36.0  PLT 127* 130* 144*  MCV 78.5 78.5 80.0  MCH 26.7 26.3 27.1  MCHC 33.9 33.5 33.9  RDW 14.2 14.3 14.6  LYMPHSABS 1.8  --   --   MONOABS 1.2*  --   --   EOSABS 0.2  --   --   BASOSABS 0.0  --   --     Chemistries   Recent Labs Lab 01/27/15 0510 01/17/2015 1700 01/31/15 0525 02/01/15 0335  NA 140 138 137 137  K 4.8 4.1 4.1 4.4  CL 107 102 105 102  CO2 '26 24 23 24   '$ GLUCOSE 114* 153* 168* 130*  BUN 28* '19 18 18  '$ CREATININE 1.86* 1.86* 1.76* 1.90*  CALCIUM 8.3* 8.6* 8.3* 8.1*  MG  --   --  2.1  --   AST  --  33 28  --   ALT  --  33 31  --   ALKPHOS  --  59 53  --   BILITOT  --  0.7 0.6  --    ------------------------------------------------------------------------------------------------------------------ estimated creatinine clearance is 25.7 mL/min (by C-G formula based on Cr of 1.9). ------------------------------------------------------------------------------------------------------------------ No results for input(s): HGBA1C in the last 72 hours. ------------------------------------------------------------------------------------------------------------------ No results for input(s): CHOL, HDL, LDLCALC, TRIG, CHOLHDL, LDLDIRECT in the last 72 hours. ------------------------------------------------------------------------------------------------------------------  Recent Labs  01/31/15 0525  TSH 0.548   ------------------------------------------------------------------------------------------------------------------ No results for input(s): VITAMINB12, FOLATE, FERRITIN, TIBC, IRON, RETICCTPCT in the last 72 hours.  Coagulation profile No results for input(s): INR, PROTIME in the last 168 hours.  No results for input(s): DDIMER in the last 72 hours.  Cardiac Enzymes No results for input(s): CKMB, TROPONINI, MYOGLOBIN in the last 168 hours.  Invalid input(s): CK ------------------------------------------------------------------------------------------------------------------ Invalid input(s): POCBNP   Time Spent in minutes   35   Lala Lund K M.D on 02/10/2015 at 10:27 AM  Between 7am to 7pm - Pager - [phone]  After 7pm go to [email or web address] - password [facility]  [facility]   Office  [phone]

## 2015-02-03 ENCOUNTER — Inpatient Hospital Stay (HOSPITAL_COMMUNITY): Payer: Medicare Other

## 2015-02-03 ENCOUNTER — Inpatient Hospital Stay (HOSPITAL_COMMUNITY): Payer: Medicare Other | Admitting: Anesthesiology

## 2015-02-03 ENCOUNTER — Encounter (HOSPITAL_COMMUNITY): Payer: Self-pay | Admitting: Pulmonary Disease

## 2015-02-03 ENCOUNTER — Encounter (HOSPITAL_COMMUNITY): Admission: EM | Disposition: E | Payer: Self-pay | Source: Home / Self Care | Attending: Internal Medicine

## 2015-02-03 DIAGNOSIS — J910 Malignant pleural effusion: Secondary | ICD-10-CM

## 2015-02-03 DIAGNOSIS — I998 Other disorder of circulatory system: Secondary | ICD-10-CM

## 2015-02-03 DIAGNOSIS — I509 Heart failure, unspecified: Secondary | ICD-10-CM

## 2015-02-03 DIAGNOSIS — N183 Chronic kidney disease, stage 3 (moderate): Secondary | ICD-10-CM

## 2015-02-03 DIAGNOSIS — J95821 Acute postprocedural respiratory failure: Secondary | ICD-10-CM

## 2015-02-03 DIAGNOSIS — I70222 Atherosclerosis of native arteries of extremities with rest pain, left leg: Secondary | ICD-10-CM

## 2015-02-03 DIAGNOSIS — C3490 Malignant neoplasm of unspecified part of unspecified bronchus or lung: Secondary | ICD-10-CM

## 2015-02-03 HISTORY — PX: PATCH ANGIOPLASTY: SHX6230

## 2015-02-03 HISTORY — PX: EMBOLECTOMY: SHX44

## 2015-02-03 HISTORY — PX: ENDARTERECTOMY FEMORAL: SHX5804

## 2015-02-03 LAB — GLUCOSE, CAPILLARY
GLUCOSE-CAPILLARY: 165 mg/dL — AB (ref 65–99)
GLUCOSE-CAPILLARY: 151 mg/dL — AB (ref 65–99)
GLUCOSE-CAPILLARY: 109 mg/dL — AB (ref 65–99)
Glucose-Capillary: 116 mg/dL — ABNORMAL HIGH (ref 65–99)

## 2015-02-03 LAB — URINALYSIS, ROUTINE W REFLEX MICROSCOPIC
Glucose, UA: NEGATIVE mg/dL
Ketones, ur: NEGATIVE mg/dL
Nitrite: NEGATIVE
PROTEIN: 30 mg/dL — AB
Specific Gravity, Urine: 1.025 (ref 1.005–1.030)
Urobilinogen, UA: 1 mg/dL (ref 0.0–1.0)
pH: 5 (ref 5.0–8.0)

## 2015-02-03 LAB — BASIC METABOLIC PANEL
ANION GAP: 12 (ref 5–15)
Anion gap: 11 (ref 5–15)
BUN: 21 mg/dL — ABNORMAL HIGH (ref 6–20)
BUN: 28 mg/dL — AB (ref 6–20)
CHLORIDE: 106 mmol/L (ref 101–111)
CO2: 20 mmol/L — ABNORMAL LOW (ref 22–32)
CO2: 18 mmol/L — ABNORMAL LOW (ref 22–32)
Calcium: 8 mg/dL — ABNORMAL LOW (ref 8.9–10.3)
Calcium: 7.6 mg/dL — ABNORMAL LOW (ref 8.9–10.3)
Chloride: 107 mmol/L (ref 101–111)
Creatinine, Ser: 2.09 mg/dL — ABNORMAL HIGH (ref 0.44–1.00)
Creatinine, Ser: 2.67 mg/dL — ABNORMAL HIGH (ref 0.44–1.00)
GFR calc Af Amer: 25 mL/min — ABNORMAL LOW (ref >60)
GFR calc Af Amer: 19 mL/min — ABNORMAL LOW (ref >60)
GFR, EST NON AFRICAN AMERICAN: 22 mL/min — AB (ref >60)
GFR, EST NON AFRICAN AMERICAN: 16 mL/min — AB (ref >60)
GLUCOSE: 129 mg/dL — AB (ref 65–99)
Glucose, Bld: 130 mg/dL — ABNORMAL HIGH (ref 65–99)
Potassium: 4.3 mmol/L (ref 3.5–5.1)
Potassium: 4.2 mmol/L (ref 3.5–5.1)
SODIUM: 137 mmol/L (ref 135–145)
SODIUM: 137 mmol/L (ref 135–145)

## 2015-02-03 LAB — URINE MICROSCOPIC-ADD ON

## 2015-02-03 LAB — TYPE AND SCREEN
ABO/RH(D): O POS
Antibody Screen: NEGATIVE

## 2015-02-03 LAB — POCT I-STAT 3, ART BLOOD GAS (G3+)
ACID-BASE DEFICIT: 9 mmol/L — AB (ref 0.0–2.0)
Bicarbonate: 17.2 mEq/L — ABNORMAL LOW (ref 20.0–24.0)
O2 Saturation: 91 %
PCO2 ART: 38.4 mmHg (ref 35.0–45.0)
TCO2: 18 mmol/L (ref 0–100)
pH, Arterial: 7.26 — ABNORMAL LOW (ref 7.350–7.450)
pO2, Arterial: 70 mmHg — ABNORMAL LOW (ref 80.0–100.0)

## 2015-02-03 LAB — CBC
HCT: 29.5 % — ABNORMAL LOW (ref 36.0–46.0)
HEMATOCRIT: 33.7 % — AB (ref 36.0–46.0)
Hemoglobin: 11.3 g/dL — ABNORMAL LOW (ref 12.0–15.0)
Hemoglobin: 9.6 g/dL — ABNORMAL LOW (ref 12.0–15.0)
MCH: 26.7 pg (ref 26.0–34.0)
MCH: 26.3 pg (ref 26.0–34.0)
MCHC: 33.5 g/dL (ref 30.0–36.0)
MCHC: 32.5 g/dL (ref 30.0–36.0)
MCV: 79.5 fL (ref 78.0–100.0)
MCV: 80.8 fL (ref 78.0–100.0)
Platelets: 157 10*3/uL (ref 150–400)
Platelets: 150 10*3/uL (ref 150–400)
RBC: 4.24 MIL/uL (ref 3.87–5.11)
RBC: 3.65 MIL/uL — ABNORMAL LOW (ref 3.87–5.11)
RDW: 14.3 % (ref 11.5–15.5)
RDW: 14.5 % (ref 11.5–15.5)
WBC: 15.9 10*3/uL — ABNORMAL HIGH (ref 4.0–10.5)
WBC: 19.3 10*3/uL — AB (ref 4.0–10.5)

## 2015-02-03 LAB — ABO/RH: ABO/RH(D): O POS

## 2015-02-03 LAB — HEPARIN LEVEL (UNFRACTIONATED): HEPARIN UNFRACTIONATED: 0.2 [IU]/mL — AB (ref 0.30–0.70)

## 2015-02-03 LAB — LACTIC ACID, PLASMA: Lactic Acid, Venous: 1.6 mmol/L (ref 0.5–2.0)

## 2015-02-03 LAB — MRSA PCR SCREENING: MRSA BY PCR: NEGATIVE

## 2015-02-03 SURGERY — ENDARTERECTOMY, FEMORAL
Anesthesia: General | Site: Leg Upper | Laterality: Left

## 2015-02-03 MED ORDER — BISACODYL 10 MG RE SUPP: 10.0000 mg | Freq: Every day | RECTAL | Status: DC | PRN

## 2015-02-03 MED ORDER — LACTATED RINGERS IV SOLN
INTRAVENOUS | Status: DC | PRN
  Administered 2015-02-03: 07:00:00 via INTRAVENOUS

## 2015-02-03 MED ORDER — HEPARIN SODIUM (PORCINE) 1000 UNIT/ML IJ SOLN
INTRAMUSCULAR | Status: DC | PRN
Start: 2015-02-03 — End: 2015-02-03
  Administered 2015-02-03: 2000 [IU] via INTRAVENOUS
  Administered 2015-02-03: 7000 [IU] via INTRAVENOUS
  Administered 2015-02-03: 2000 [IU] via INTRAVENOUS

## 2015-02-03 MED ORDER — FENTANYL CITRATE (PF) 100 MCG/2ML IJ SOLN: 50.0000 ug | INTRAMUSCULAR | Status: DC | PRN

## 2015-02-03 MED ORDER — HYDROMORPHONE HCL 1 MG/ML IJ SOLN
INTRAMUSCULAR | Status: AC
  Filled 2015-02-03: qty 1

## 2015-02-03 MED ORDER — ONDANSETRON HCL 4 MG/2ML IJ SOLN
INTRAMUSCULAR | Status: AC
  Filled 2015-02-03: qty 2

## 2015-02-03 MED ORDER — PHENYLEPHRINE HCL 10 MG/ML IJ SOLN
INTRAMUSCULAR | Status: AC
  Filled 2015-02-03: qty 1

## 2015-02-03 MED ORDER — MIDAZOLAM HCL 2 MG/2ML IJ SOLN
1.0000 mg | INTRAMUSCULAR | Status: DC | PRN
  Administered 2015-02-03 (×2): 1 mg via INTRAVENOUS
  Filled 2015-02-03 (×3): qty 2

## 2015-02-03 MED ORDER — DEXMEDETOMIDINE HCL IN NACL 200 MCG/50ML IV SOLN
0.4000 ug/kg/h | INTRAVENOUS | Status: DC
  Administered 2015-02-03: 0.7 ug/kg/h via INTRAVENOUS
  Administered 2015-02-03: 0.4 ug/kg/h via INTRAVENOUS
  Filled 2015-02-03: qty 50

## 2015-02-03 MED ORDER — MORPHINE SULFATE 2 MG/ML IJ SOLN: 1.0000 mg | INTRAMUSCULAR | Status: DC | PRN

## 2015-02-03 MED ORDER — THROMBIN 20000 UNITS EX SOLR
CUTANEOUS | Status: AC
  Filled 2015-02-03: qty 20000

## 2015-02-03 MED ORDER — SODIUM CHLORIDE 0.9 % IR SOLN
Status: DC | PRN
  Administered 2015-02-03: 500 mL

## 2015-02-03 MED ORDER — LIDOCAINE HCL (CARDIAC) 20 MG/ML IV SOLN
INTRAVENOUS | Status: DC | PRN
  Administered 2015-02-03: 100 mg via INTRAVENOUS

## 2015-02-03 MED ORDER — HEPARIN SODIUM (PORCINE) 5000 UNIT/ML IJ SOLN: 5000.0000 [IU] | Freq: Three times a day (TID) | INTRAMUSCULAR | Status: DC

## 2015-02-03 MED ORDER — PROMETHAZINE HCL 25 MG/ML IJ SOLN
6.2500 mg | INTRAMUSCULAR | Status: DC | PRN
Start: 2015-02-03 — End: 2015-02-03

## 2015-02-03 MED ORDER — MIDAZOLAM HCL 2 MG/2ML IJ SOLN
1.0000 mg | INTRAMUSCULAR | Status: DC | PRN
  Administered 2015-02-03 (×2): 1 mg via INTRAVENOUS
  Filled 2015-02-03: qty 2

## 2015-02-03 MED ORDER — SUCCINYLCHOLINE CHLORIDE 20 MG/ML IJ SOLN
INTRAMUSCULAR | Status: AC
  Filled 2015-02-03: qty 1

## 2015-02-03 MED ORDER — ALBUMIN HUMAN 5 % IV SOLN
INTRAVENOUS | Status: DC | PRN
  Administered 2015-02-03: 10:00:00 via INTRAVENOUS

## 2015-02-03 MED ORDER — MIDAZOLAM HCL 2 MG/2ML IJ SOLN: 2.0000 mg | Freq: Once | INTRAMUSCULAR | Status: AC

## 2015-02-03 MED ORDER — FENTANYL CITRATE (PF) 100 MCG/2ML IJ SOLN
INTRAMUSCULAR | Status: DC | PRN
  Administered 2015-02-03 (×6): 50 ug via INTRAVENOUS

## 2015-02-03 MED ORDER — 0.9 % SODIUM CHLORIDE (POUR BTL) OPTIME
TOPICAL | Status: DC | PRN
  Administered 2015-02-03: 3000 mL

## 2015-02-03 MED ORDER — HYDROMORPHONE HCL 1 MG/ML IJ SOLN
INTRAMUSCULAR | Status: AC
  Administered 2015-02-03: 0.5 mg via INTRAVENOUS
  Filled 2015-02-03: qty 1

## 2015-02-03 MED ORDER — NEOSTIGMINE METHYLSULFATE 10 MG/10ML IV SOLN
INTRAVENOUS | Status: DC | PRN
Start: 2015-02-03 — End: 2015-02-03
  Administered 2015-02-03: 4 mg via INTRAVENOUS

## 2015-02-03 MED ORDER — PANTOPRAZOLE SODIUM 40 MG IV SOLR
40.0000 mg | INTRAVENOUS | Status: DC
  Administered 2015-02-03: 40 mg via INTRAVENOUS
  Filled 2015-02-03 (×2): qty 40

## 2015-02-03 MED ORDER — CHLORHEXIDINE GLUCONATE 0.12 % MT SOLN
15.0000 mL | Freq: Two times a day (BID) | OROMUCOSAL | Status: DC
  Administered 2015-02-03 – 2015-02-04 (×2): 15 mL via OROMUCOSAL
  Filled 2015-02-03: qty 15

## 2015-02-03 MED ORDER — LIDOCAINE HCL (CARDIAC) 20 MG/ML IV SOLN
INTRAVENOUS | Status: AC
  Filled 2015-02-03: qty 5

## 2015-02-03 MED ORDER — PROTAMINE SULFATE 10 MG/ML IV SOLN
INTRAVENOUS | Status: DC | PRN
  Administered 2015-02-03: 40 mg via INTRAVENOUS

## 2015-02-03 MED ORDER — FENTANYL CITRATE (PF) 100 MCG/2ML IJ SOLN
50.0000 ug | INTRAMUSCULAR | Status: DC | PRN
  Administered 2015-02-03 (×2): 50 ug via INTRAVENOUS
  Filled 2015-02-03 (×2): qty 2

## 2015-02-03 MED ORDER — PHENYLEPHRINE HCL 10 MG/ML IJ SOLN
INTRAMUSCULAR | Status: DC | PRN
  Administered 2015-02-03 (×5): 80 ug via INTRAVENOUS
  Administered 2015-02-03: 40 ug via INTRAVENOUS

## 2015-02-03 MED ORDER — SODIUM CHLORIDE 0.9 % IV SOLN
25.0000 ug/h | INTRAVENOUS | Status: DC
  Filled 2015-02-03: qty 50

## 2015-02-03 MED ORDER — PHENYLEPHRINE HCL 10 MG/ML IJ SOLN
10.0000 mg | INTRAVENOUS | Status: DC | PRN
  Administered 2015-02-03: 20 ug/min via INTRAVENOUS

## 2015-02-03 MED ORDER — PROPOFOL 10 MG/ML IV BOLUS
INTRAVENOUS | Status: DC | PRN
  Administered 2015-02-03: 140 mg via INTRAVENOUS

## 2015-02-03 MED ORDER — ROCURONIUM BROMIDE 100 MG/10ML IV SOLN
INTRAVENOUS | Status: DC | PRN
  Administered 2015-02-03: 10 mg via INTRAVENOUS
  Administered 2015-02-03: 30 mg via INTRAVENOUS
  Administered 2015-02-03: 20 mg via INTRAVENOUS

## 2015-02-03 MED ORDER — ESMOLOL HCL 10 MG/ML IV SOLN
INTRAVENOUS | Status: DC | PRN
  Administered 2015-02-03: 30 mg via INTRAVENOUS
  Administered 2015-02-03: 20 mg via INTRAVENOUS

## 2015-02-03 MED ORDER — FENTANYL CITRATE (PF) 250 MCG/5ML IJ SOLN
INTRAMUSCULAR | Status: AC
  Filled 2015-02-03: qty 5

## 2015-02-03 MED ORDER — ESMOLOL HCL 10 MG/ML IV SOLN
INTRAVENOUS | Status: AC
  Filled 2015-02-03: qty 10

## 2015-02-03 MED ORDER — GLYCOPYRROLATE 0.2 MG/ML IJ SOLN
INTRAMUSCULAR | Status: DC | PRN
  Administered 2015-02-03: .6 mg via INTRAVENOUS

## 2015-02-03 MED ORDER — FENTANYL BOLUS VIA INFUSION
25.0000 ug | INTRAVENOUS | Status: DC | PRN
  Filled 2015-02-03: qty 25

## 2015-02-03 MED ORDER — HEPARIN (PORCINE) IN NACL 100-0.45 UNIT/ML-% IJ SOLN
1200.0000 [IU]/h | INTRAMUSCULAR | Status: DC
  Administered 2015-02-03: 500 [IU]/h via INTRAVENOUS
  Administered 2015-02-04 – 2015-02-05 (×2): 1200 [IU]/h via INTRAVENOUS
  Filled 2015-02-03 (×7): qty 250

## 2015-02-03 MED ORDER — VITAL HIGH PROTEIN PO LIQD
1000.0000 mL | ORAL | Status: DC
  Filled 2015-02-03 (×2): qty 1000

## 2015-02-03 MED ORDER — PROPOFOL 10 MG/ML IV BOLUS
INTRAVENOUS | Status: AC
  Filled 2015-02-03: qty 40

## 2015-02-03 MED ORDER — SODIUM CHLORIDE 0.9 % IV SOLN
100.0000 ug/h | INTRAVENOUS | Status: DC
  Administered 2015-02-03: 25 ug/h via INTRAVENOUS

## 2015-02-03 MED ORDER — FENTANYL CITRATE (PF) 100 MCG/2ML IJ SOLN: 25.0000 ug | INTRAMUSCULAR | Status: DC | PRN

## 2015-02-03 MED ORDER — INSULIN ASPART 100 UNIT/ML ~~LOC~~ SOLN
0.0000 [IU] | SUBCUTANEOUS | Status: DC
  Administered 2015-02-04 (×3): 2 [IU] via SUBCUTANEOUS
  Administered 2015-02-05: 3 [IU] via SUBCUTANEOUS
  Administered 2015-02-05 – 2015-02-09 (×6): 2 [IU] via SUBCUTANEOUS
  Administered 2015-02-09: 3 [IU] via SUBCUTANEOUS
  Administered 2015-02-10 (×2): 2 [IU] via SUBCUTANEOUS
  Administered 2015-02-11: 3 [IU] via SUBCUTANEOUS
  Administered 2015-02-11: 5 [IU] via SUBCUTANEOUS
  Administered 2015-02-11: 3 [IU] via SUBCUTANEOUS
  Administered 2015-02-11: 5 [IU] via SUBCUTANEOUS
  Administered 2015-02-11 – 2015-02-12 (×5): 3 [IU] via SUBCUTANEOUS

## 2015-02-03 MED ORDER — SODIUM CHLORIDE 0.9 % IV SOLN
INTRAVENOUS | Status: AC
  Administered 2015-02-03: 15:00:00 via INTRAVENOUS
  Administered 2015-02-04 – 2015-02-05 (×2): 75 mL/h via INTRAVENOUS

## 2015-02-03 MED ORDER — STERILE WATER FOR INJECTION IJ SOLN
INTRAMUSCULAR | Status: AC
  Filled 2015-02-03: qty 10

## 2015-02-03 MED ORDER — FENTANYL CITRATE (PF) 100 MCG/2ML IJ SOLN
INTRAMUSCULAR | Status: AC
  Administered 2015-02-03: 50 ug via INTRAVENOUS
  Filled 2015-02-03: qty 2

## 2015-02-03 MED ORDER — SUCCINYLCHOLINE CHLORIDE 20 MG/ML IJ SOLN
INTRAMUSCULAR | Status: DC | PRN
  Administered 2015-02-03: 100 mg via INTRAVENOUS

## 2015-02-03 MED ORDER — EPHEDRINE SULFATE 50 MG/ML IJ SOLN
INTRAMUSCULAR | Status: AC
  Filled 2015-02-03: qty 1

## 2015-02-03 MED ORDER — HYDROMORPHONE HCL 1 MG/ML IJ SOLN
0.2500 mg | INTRAMUSCULAR | Status: DC | PRN
  Administered 2015-02-03 (×3): 0.5 mg via INTRAVENOUS

## 2015-02-03 MED ORDER — FENTANYL CITRATE (PF) 100 MCG/2ML IJ SOLN: 50.0000 ug | Freq: Once | INTRAMUSCULAR | Status: DC

## 2015-02-03 MED ORDER — HYDROCODONE-ACETAMINOPHEN 7.5-325 MG PO TABS: 1.0000 | ORAL_TABLET | Freq: Once | ORAL | Status: DC | PRN

## 2015-02-03 MED ORDER — CETYLPYRIDINIUM CHLORIDE 0.05 % MT LIQD
7.0000 mL | Freq: Four times a day (QID) | OROMUCOSAL | Status: DC
  Administered 2015-02-04 (×4): 7 mL via OROMUCOSAL

## 2015-02-03 MED ORDER — FENTANYL BOLUS VIA INFUSION
25.0000 ug | INTRAVENOUS | Status: DC | PRN
  Administered 2015-02-03 – 2015-02-04 (×6): 50 ug via INTRAVENOUS
  Filled 2015-02-03: qty 50

## 2015-02-03 SURGICAL SUPPLY — 73 items
BANDAGE ESMARK 6X9 LF (GAUZE/BANDAGES/DRESSINGS) IMPLANT
BNDG ESMARK 6X9 LF (GAUZE/BANDAGES/DRESSINGS)
CANISTER SUCTION 2500CC (MISCELLANEOUS) ×5 IMPLANT
CANNULA VESSEL 3MM 2 BLNT TIP (CANNULA) ×10 IMPLANT
CATH EMB 3FR 80CM (CATHETERS) ×5 IMPLANT
CATH EMB 4FR 40CM (CATHETERS) ×5 IMPLANT
CATH EMB 4FR 80CM (CATHETERS) IMPLANT
CATH EMB 5FR 80CM (CATHETERS) IMPLANT
CLIP TI MEDIUM 24 (CLIP) ×5 IMPLANT
CLIP TI WIDE RED SMALL 24 (CLIP) ×5 IMPLANT
CUFF TOURNIQUET SINGLE 18IN (TOURNIQUET CUFF) IMPLANT
CUFF TOURNIQUET SINGLE 24IN (TOURNIQUET CUFF) IMPLANT
CUFF TOURNIQUET SINGLE 34IN LL (TOURNIQUET CUFF) IMPLANT
CUFF TOURNIQUET SINGLE 44IN (TOURNIQUET CUFF) IMPLANT
DRAIN CHANNEL 15F RND FF W/TCR (WOUND CARE) ×10 IMPLANT
DRAPE X-RAY CASS 24X20 (DRAPES) IMPLANT
DRSG COVADERM 4X8 (GAUZE/BANDAGES/DRESSINGS) IMPLANT
ELECT CAUTERY BLADE 6.4 (BLADE) ×5 IMPLANT
ELECT REM PT RETURN 9FT ADLT (ELECTROSURGICAL) ×5
ELECTRODE REM PT RTRN 9FT ADLT (ELECTROSURGICAL) ×3 IMPLANT
EVACUATOR SILICONE 100CC (DRAIN) ×10 IMPLANT
GAUZE SPONGE 4X4 16PLY XRAY LF (GAUZE/BANDAGES/DRESSINGS) ×5 IMPLANT
GLOVE BIO SURGEON STRL SZ 6.5 (GLOVE) ×4 IMPLANT
GLOVE BIO SURGEON STRL SZ7 (GLOVE) ×10 IMPLANT
GLOVE BIO SURGEON STRL SZ7.5 (GLOVE) ×5 IMPLANT
GLOVE BIO SURGEONS STRL SZ 6.5 (GLOVE) ×1
GLOVE BIOGEL PI IND STRL 6.5 (GLOVE) ×12 IMPLANT
GLOVE BIOGEL PI IND STRL 7.0 (GLOVE) ×6 IMPLANT
GLOVE BIOGEL PI IND STRL 7.5 (GLOVE) ×3 IMPLANT
GLOVE BIOGEL PI IND STRL 8 (GLOVE) ×3 IMPLANT
GLOVE BIOGEL PI INDICATOR 6.5 (GLOVE) ×8
GLOVE BIOGEL PI INDICATOR 7.0 (GLOVE) ×4
GLOVE BIOGEL PI INDICATOR 7.5 (GLOVE) ×2
GLOVE BIOGEL PI INDICATOR 8 (GLOVE) ×2
GLOVE ECLIPSE 6.5 STRL STRAW (GLOVE) ×10 IMPLANT
GOWN STRL REUS W/ TWL LRG LVL3 (GOWN DISPOSABLE) ×9 IMPLANT
GOWN STRL REUS W/ TWL XL LVL3 (GOWN DISPOSABLE) ×6 IMPLANT
GOWN STRL REUS W/TWL LRG LVL3 (GOWN DISPOSABLE) ×6
GOWN STRL REUS W/TWL XL LVL3 (GOWN DISPOSABLE) ×4
KIT BASIN OR (CUSTOM PROCEDURE TRAY) ×5 IMPLANT
KIT PLEURX DRAIN CATH 1000ML (MISCELLANEOUS) ×5 IMPLANT
KIT ROOM TURNOVER OR (KITS) ×5 IMPLANT
LIQUID BAND (GAUZE/BANDAGES/DRESSINGS) ×10 IMPLANT
NS IRRIG 1000ML POUR BTL (IV SOLUTION) ×10 IMPLANT
PACK PERIPHERAL VASCULAR (CUSTOM PROCEDURE TRAY) ×5 IMPLANT
PAD ARMBOARD 7.5X6 YLW CONV (MISCELLANEOUS) ×10 IMPLANT
PATCH VASC XENOSURE 1CMX6CM (Vascular Products) ×2 IMPLANT
PATCH VASC XENOSURE 1X6 (Vascular Products) ×3 IMPLANT
PENCIL BUTTON HOLSTER BLD 10FT (ELECTRODE) ×5 IMPLANT
SET COLLECT BLD 21X3/4 12 (NEEDLE) IMPLANT
SPONGE GAUZE 4X4 12PLY STER LF (GAUZE/BANDAGES/DRESSINGS) ×5 IMPLANT
SPONGE SURGIFOAM ABS GEL 100 (HEMOSTASIS) IMPLANT
STAPLER VISISTAT (STAPLE) IMPLANT
STOPCOCK 4 WAY LG BORE MALE ST (IV SETS) ×5 IMPLANT
SUT ETHILON 3 0 FSL (SUTURE) ×5 IMPLANT
SUT PROLENE 5 0 C 1 24 (SUTURE) ×10 IMPLANT
SUT PROLENE 5 0 C 1 36 (SUTURE) ×5 IMPLANT
SUT PROLENE 6 0 BV (SUTURE) ×20 IMPLANT
SUT SILK 2 0 (SUTURE) ×2
SUT SILK 2-0 18XBRD TIE 12 (SUTURE) ×3 IMPLANT
SUT SILK 3 0 (SUTURE) ×2
SUT SILK 3-0 18XBRD TIE 12 (SUTURE) ×3 IMPLANT
SUT VIC AB 2-0 CTB1 (SUTURE) ×15 IMPLANT
SUT VIC AB 3-0 SH 27 (SUTURE) ×4
SUT VIC AB 3-0 SH 27X BRD (SUTURE) ×6 IMPLANT
SUT VICRYL 4-0 PS2 18IN ABS (SUTURE) ×15 IMPLANT
SYR 3ML LL SCALE MARK (SYRINGE) ×10 IMPLANT
SYRINGE 1CC SLIP TB (MISCELLANEOUS) ×5 IMPLANT
TAPE CLOTH SURG 4X10 WHT LF (GAUZE/BANDAGES/DRESSINGS) ×5 IMPLANT
TRAY FOLEY W/METER SILVER 16FR (SET/KITS/TRAYS/PACK) ×5 IMPLANT
TUBING EXTENTION W/L.L. (IV SETS) IMPLANT
UNDERPAD 30X30 INCONTINENT (UNDERPADS AND DIAPERS) ×5 IMPLANT
WATER STERILE IRR 1000ML POUR (IV SOLUTION) ×5 IMPLANT

## 2015-02-03 NOTE — Progress Notes (Signed)
CALLED AND RECEIVED AN ORDER FOR C-DIFF,BUT STOOL WASN'T LOOSE ENOUGH. PAGED DR. K.SCHORR, RN MADE HER AWARE THAT C-DIFF ORDER WAS DISCONTINUED.

## 2015-02-03 NOTE — Op Note (Signed)
NAME: April Kennedy   MRN: [id number] DOB: [birth year]    DATE OF OPERATION: 01/24/2015  PREOP DIAGNOSIS: Progressive ischemia of left lower extremity with rest pain  POSTOP DIAGNOSIS: same  PROCEDURE: 1. Endarterectomy of left external iliac artery and common femoral artery with vein patch angioplasty (right greater saphenous vein) 2. Embolectomy of anterior tibial artery, posterior tibial artery, and peroneal artery. 3. Vein patch angioplasty of tibial perineal trunk and Posterior we'll artery. (right greater saphenous vein) 4. Endarterectomy of left superficial femoral artery with patch change of plasty (bovine pericardial patch) 5. Endarterectomy of the proximal left posterior tibial artery.  SURGEON: Judeth Cornfield. Scot Dock, MD, FACS  ASSIST: Adele Barthel, MD, Silva Bandy, [facility]  ANESTHESIA: Gen.   EBL: per anesthesia record  INDICATIONS: April Kennedy is a 81 y.o. female who presented with an ingrown toenail on the right foot with redness and swelling but at the same time had noticed the beginning of pain in her left foot which was progressing. She has chronic renal insufficiency and a CO2 arteriogram was obtained. This showed evidence of significant stenosis in the left external iliac artery and common femoral artery with also severe tibial disease versus embolic disease to her tibial arteries. Given the progressive ischemia and discoloration of her forefoot she had a limb threatening situation and we recommended to proceed with attempted revascularization.  FINDINGS: There was an extensive plaque in the common femoral artery that extended well up into the external iliac artery. This was likely a source of embolization. A large amount of chronic clot was retrieved from the anterior tibial, posterior tibial, and peroneal arteries. There was some fresh clot retrieved from the posterior tibial artery.  TECHNIQUE: The patient was taken to the operating room and received a general  anesthetic. Given her obesity, I elected to make an oblique incision above the inguinal crease to assist with wound healing postoperatively. The incision was initially made in oblique fashion and then the deeper dissection was made in a vertical fashion down to the common femoral artery. The common femoral artery, superficial femoral artery, and deep femoral arteries were controlled with vessel loops. The proximal saphenous vein was controlled and a segment of this harvested to be used as a vein patch. This was ligated distally and proximally and preserved in heparinized saline.  The plaque extended very far proximally up the external iliac artery and I had dissected well above the inguinal ligament. The patient was then heparinized. The external iliac artery was clamped and then the deep femoral artery and superficial femoral arteries were controlled. A longitudinal arteriotomy was made in the common femoral artery. This was extended up the external iliac artery over the plaque extended fairly high. This reason I elected to get proximal control with a #4 Fogarty catheter. This was advanced up to the external iliac artery and the balloon inflated for proximal control.  An endarterectomy plane was established proximally and endarterectomy was performed. I wasn't able to retrieve a extensive amount of plaque proximally where the plaque involved the external iliac artery. The vein patch was then sewn using continuous 50 proline suture.  A longitudinal incision was made on the medial aspect of the left leg and the dissection carried down to the below-knee popliteal artery. The anterior tibial vein was divided between 2-0 silk ties. This allowed me to full with Foley dissected out the anterior tibial artery, tibial peroneal trunk, peroneal artery, and posterior tibial arteries. The patient received additional heparin. These vessels  were controlled in a longitudinal arteriotomy was made in the tibial peroneal trunk.  This extended slightly into the below-knee pop to artery. 21 Was passed proximally multiple times and no clot was retrieved. I directed a #3 Fogarty catheter individually down the anterior tibial, posterior tibial, and a arteries. A large amount of organized clot was retrieved that appeared to be chronic. Some fresh clot was retrieved from the posterior tibial artery. No further clot was retrieved. Of note, in order to get the catheter down the posterior tibial artery was significant plaque present proximally and a localized endarterectomy of the proximal posterior tibial artery was performed.  The vein patch was then sewn over the tibial peroneal trunk using 50 proline suture.   At the completion, I was not happy with the inflow. In the groin was good pulse in the proximal superficial femoral artery beyond that the pulse was diminished. I was concerned about possible dissection or plaque here and therefore I dissected the superficial femoral L further distally. The patient received additional heparin. The artery was clamped proximally and distally and a longitudinal arteriotomy was made. A large segment of plaque which had dissected was removed. This area was then patched with a bovine pericardial patch at this point there was good flow and a good pulse below the knee. It was a peroneal and anterior tibial signal with the Doppler at the completion.  I was unable to obtain a completion arteriogram even the patient's renal insufficiency.  Hemostasis was obtained in the wounds and the heparin was partially reversed with protamine. The below the knee incision was closed with 2 deep layers of 2-0 Vicryl and the skin closed with 4-0 Vicryl. This was closed over a 15 Blake drain. The femoral sheath was closed over a 15 Blake drain in the groin with running 2-0 Vicryl. The deep layer was closed with 2-0 Vicryl. The subcutaneous layer was closed with 3-0 Vicryl. The skin was closed with 4-0 Vicryl. Sterile dressing  was applied. The patient tolerated the procedure well and transferred to recovery room in stable condition. All needle and sponge counts were correct.  Deitra Mayo, MD, FACS [facility]  DATE OF DICTATION:   02/14/2015

## 2015-02-03 NOTE — Progress Notes (Signed)
ANTICOAGULATION CONSULT NOTE - Follow Up Consult  Pharmacy Consult for Heparin Indication: L leg ischemia  Allergies  Allergen Reactions  . Sulfa Antibiotics     Hives/ itching     Patient Measurements: Height: '5\' 4"'$  (162.6 cm) Weight: 186 lb 12.8 oz (84.732 kg) IBW/kg (Calculated) : 54.7 Heparin Dosing Weight: 74 kg  Vital Signs: Temp: 99.1 F (37.3 C) (07/19 1429) Temp Source: Oral (07/19 1429) BP: 139/61 mmHg (07/19 1630) Pulse Rate: 111 (07/19 1630)  Labs:  Recent Labs  02/01/15 0335 01/29/2015 0132  HGB 12.2 11.3*  HCT 36.0 33.7*  PLT 144* 157  HEPARINUNFRC  --  0.20*  CREATININE 1.90* 2.09*    Estimated Creatinine Clearance: 23.4 mL/min (by C-G formula based on Cr of 2.09).  Assessment: Pt on heparin for L common femoral artery stenosis. S/p surgery (embolectomy, endarterectomy, patch angioplasty) this AM. large amount of chronic clot removed from ant tib, post tib, peroneal arteries, some fresh clot from post tib artery. First HL prior to surgery (started after arteriogram w/ no bolus) 0.2 on 1050 and rate incr to 1300; then decreased 500 units/hr per VVS with high risk rethrombosis and plans to increase slowly. CBC relatively stable. No bleeding or IV line issues per RN  **Per MD: to increase to 700 units/h now (7/19 '@1645'$ ), then increase to full-dose anticoagulation at 7/20 '@6am'$ .      Goal of Therapy:  Heparin level 0.3-0.7 units/ml Monitor platelets by anticoagulation protocol: Yes    Plan:  Increase heparin to 700 units/h now (7/19 '@1645'$ ), then increase to full-dose anticoagulation at 7/20 6am per VVS Daily HL/CBC Mon s/sx bleeding  Elicia Lamp, PharmD Clinical Pharmacist Pager [phone] 01/27/2015 4:51 PM

## 2015-02-03 NOTE — Progress Notes (Addendum)
MD paged regarding pt having 3 loose stools overnight. New orders given   Raliegh Ip

## 2015-02-03 NOTE — Progress Notes (Signed)
Notified MD Nester in regards to patient still being in pain even with 50 mcg/hr Fentanyl drip. MD Milinda Hirschfeld ordered to try fentanyl boluses per order first. Also notified in regards to tube feeding orders, MD Milinda Hirschfeld ordered to hold tube feedings for now since pt will probably extubated tomorrow morning 7/20. Pt has already received EKG, no new changes in rhythm. MD Nester ordered to dc that order. Will continue to monitor and assess.

## 2015-02-03 NOTE — Anesthesia Procedure Notes (Signed)
Procedure Name: Intubation Date/Time: 01/20/2015 7:32 AM Performed by: [place] Pink Pre-anesthesia Checklist: Patient identified, Emergency Drugs available, Suction available, Timeout performed and Patient being monitored Patient Re-evaluated:Patient Re-evaluated prior to inductionOxygen Delivery Method: Circle system utilized Preoxygenation: Pre-oxygenation with 100% oxygen Intubation Type: IV induction Ventilation: Oral airway inserted - appropriate to patient size and Mask ventilation without difficulty Laryngoscope Size: Mac and 3 Grade View: Grade I Tube type: Oral Tube size: 7.5 mm Number of attempts: 1 Airway Equipment and Method: Stylet and Oral airway Placement Confirmation: ETT inserted through vocal cords under direct vision,  positive ETCO2,  breath sounds checked- equal and bilateral and CO2 detector Secured at: 22 cm Tube secured with: Tape Dental Injury: Teeth and Oropharynx as per pre-operative assessment

## 2015-02-03 NOTE — Progress Notes (Signed)
eLink Physician-Brief Progress Note Patient Name: April Kennedy DOB: 25-Oct-1933 MRN: [id number]   Date of Service  02/09/2015  HPI/Events of Note  Nurse reports frequent need for bolus Fentanyl  eICU Interventions  Low dose fentanyl gtt 25-39mg/hr     Intervention Category Intermediate Interventions: Pain - evaluation and management  JTera Partridge7/19/2016, 6:33 PM

## 2015-02-03 NOTE — Progress Notes (Signed)
  Echocardiogram 2D Echocardiogram has been performed.  April Kennedy 02/04/2015, 4:32 PM

## 2015-02-03 NOTE — Anesthesia Preprocedure Evaluation (Addendum)
Anesthesia Evaluation  Patient identified by MRN, date of birth, ID band Patient awake    Reviewed: Allergy & Precautions, NPO status , Patient's Chart, lab work & pertinent test results  Airway Mallampati: II  TM Distance: >3 FB Neck ROM: Full    Dental  (+) Lower Dentures, Upper Dentures   Pulmonary asthma , former smoker,  breath sounds clear to auscultation        Cardiovascular hypertension, Pt. on medications Rhythm:Regular Rate:Normal  EF 60%.   Neuro/Psych negative neurological ROS     GI/Hepatic negative GI ROS, Neg liver ROS,   Endo/Other  diabetesMorbid obesity  Renal/GU CRFRenal disease     Musculoskeletal   Abdominal   Peds  Hematology  (+) anemia ,   Anesthesia Other Findings   Reproductive/Obstetrics                           Anesthesia Physical Anesthesia Plan  ASA: III  Anesthesia Plan: General   Post-op Pain Management:    Induction: Intravenous  Airway Management Planned: Oral ETT  Additional Equipment:   Intra-op Plan:   Post-operative Plan: Extubation in OR and Possible Post-op intubation/ventilation  Informed Consent: I have reviewed the patients History and Physical, chart, labs and discussed the procedure including the risks, benefits and alternatives for the proposed anesthesia with the patient or authorized representative who has indicated his/her understanding and acceptance.   Dental advisory given  Plan Discussed with: CRNA  Anesthesia Plan Comments:        Anesthesia Quick Evaluation

## 2015-02-03 NOTE — Plan of Care (Signed)
Went to see the patient this morning at 7:30, she was already in the OR, went back to see her in PACU. Patient still intubated. This was at 12:45 PM.  Discussed her case with anesthesia physician bedside, he informed me that patient will be going to ICU [facility] and that Dr. Doren Custard has requested she stay in ICU today.  Called and discussed the case with ICU physician Dr. Elsworth Soho, also discussed with ICU secretary. Patient has a bed into [facility] with Dr. Halford Chessman as the attending physician.   Agent will be under the care of pulmonary critical care while she is in the ICU.

## 2015-02-03 NOTE — Progress Notes (Signed)
   VASCULAR SURGERY POSTOP NOTE:  * Currently the patient only has a peroneal signal in the left lower extremity by Doppler. I had passed the Fogarty catheter multiple times down each individual tibial vessel and retrieved a large amount of old chronic clot. For this reason, I think that she is at high risk for rethrombosis. She is on low-dose heparin and I will increase this slowly. I do not think that there is anything different to do surgically given her severe tibial disease distally.  *  Appreciate CCM's help with ventilator.  SUBJECTIVE: Sedated on vent.  PHYSICAL EXAM: Filed Vitals:   01/29/2015 1515 01/28/2015 1530 01/27/2015 1545 01/25/2015 1600  BP: 105/66 118/59 117/58 107/58  Pulse: 104 93 99 101  Temp:      TempSrc:      Resp: '24 21 19 14  '$ Height:      Weight:      SpO2: 96% 100% 100% 100%   Peroneal signal with Doppler left lower extremity. 30 cc serosanguineous drainage from groin JP.  LABS: Lab Results  Component Value Date   WBC 15.9* 01/18/2015   HGB 11.3* 02/06/2015   HCT 33.7* 02/08/2015   MCV 79.5 02/06/2015   PLT 157 02/09/2015   Lab Results  Component Value Date   CREATININE 2.09* 01/26/2015   Lab Results  Component Value Date   INR 1.18 01/26/2015   CBG (last 3)   Recent Labs  02/06/2015 2114 02/01/2015 1213 02/06/2015 1422  GLUCAP 175* 165* 151*    Active Problems:   Pleural effusion, right   Essential hypertension   Chronic kidney disease, stage 3   Left leg cellulitis   Cellulitis   Foot pain, left   Malignant pleural effusion    Gae Gallop Beeper: [phone] 02/10/2015

## 2015-02-03 NOTE — Interval H&P Note (Signed)
History and Physical Interval Note:  02/11/2015 7:10 AM  April Kennedy  has presented today for surgery, with the diagnosis of Peripheral Arterial Disease with Left Lower Extremity Claudication I70.212 Left Foot Pain M79.672  The various methods of treatment have been discussed with the patient and family. After consideration of risks, benefits and other options for treatment, the patient has consented to  Procedure(s): ENDARTERECTOMY FEMORAL-LEFT (Left) POSSIBLE LEFT TIBIAL EMBOLECTOMY (Left) as a surgical intervention .  The patient's history has been reviewed, patient examined, no change in status, stable for surgery.  I have reviewed the patient's chart and labs.  Questions were answered to the patient's satisfaction.     Deitra Mayo

## 2015-02-03 NOTE — Progress Notes (Signed)
ANTICOAGULATION CONSULT NOTE - Follow Up Consult  Pharmacy Consult for Heparin Indication: L leg ischemia  Allergies  Allergen Reactions  . Sulfa Antibiotics     Hives/ itching     Patient Measurements: Height: '5\' 4"'$  (162.6 cm) Weight: 186 lb 12.8 oz (84.732 kg) IBW/kg (Calculated) : 54.7 Heparin Dosing Weight: 74 kg  Vital Signs: Temp: 98.8 F (37.1 C) (07/18 1947) Temp Source: Oral (07/18 1947) BP: 139/86 mmHg (07/18 1947) Pulse Rate: 115 (07/18 1947)  Labs:  Recent Labs  01/31/15 0525 02/01/15 0335 02/08/2015 0132  HGB 11.9* 12.2 11.3*  HCT 35.5* 36.0 33.7*  PLT 130* 144* 157  HEPARINUNFRC  --   --  0.20*  CREATININE 1.76* 1.90*  --     Estimated Creatinine Clearance: 25.7 mL/min (by C-G formula based on Cr of 1.9).  Assessment: Pt on heparin for L common femoral artery stenosis. Plan for surgery (embolectomy, endarterectomy, patch angioplasty) in a.m. Heparin level 0.3 (subtherapeutic). Noted to be turned off at 0400 for surgery. CBC relatively stable, plt ok. No bleeding noted.     Goal of Therapy:  Heparin level 0.3-0.7 units/ml Monitor platelets by anticoagulation protocol: Yes    Plan:  Increase heparin drip to 1300 units/hr. Heparin to be turned off at 0400. Will f/u post surgery  Sherlon Handing, PharmD, BCPS Clinical pharmacist, pager [phone] 01/16/2015,2:23 AM

## 2015-02-03 NOTE — Anesthesia Postprocedure Evaluation (Signed)
  Anesthesia Post-op Note  Patient: April Kennedy  Procedure(s) Performed: Procedure(s): ENDARTERECTOMY FEMORAL-LEFT (Left) LEFT TIBIAL EMBOLECTOMY (Left) PATCH ANGIOPLASTY FEMORAL ARTERY USING XENOSURE BIOLOGIC PATCH 1cm x 6cm (Left)  Patient Location: PACU  Anesthesia Type:General  Level of Consciousness: sedated, responds to stimulation and Patient remains intubated per anesthesia plan  Airway and Oxygen Therapy: Patient remains intubated per anesthesia plan  Post-op Pain: none  Post-op Assessment: Post-op Vital signs reviewed   LLE Sensation: Decreased, Numbness, Pain   RLE Sensation: No numbness, No pain, No tingling      Post-op Vital Signs: Reviewed  Last Vitals:  Filed Vitals:   02/04/2015 1429  BP:   Pulse:   Temp: 37.3 C  Resp:     Complications: respiratory complications. Pt with accessory muscle use, and inadequate tidal volumes in OR and in PACU. Requiring full ventilatory support. Pt with underlying lung dysfunction and malignant pleural effusion. Unable to wean vent at this time. Will send to ICU intubated for additional vent wean.

## 2015-02-03 NOTE — Progress Notes (Signed)
Dr. Scot Dock at bedside, aware about cool left foot, dopplerable left peroneal pulse and unable to doppler pulses to right foot.

## 2015-02-03 NOTE — Progress Notes (Signed)
PT transported to OR per bed. Telemetry removed and CCMD notified Glasses removed and placed with rest of personal belongings remaining in 2W27.

## 2015-02-03 NOTE — Progress Notes (Signed)
PleurX catheter in place with no orders of how often to drain. MD made aware. Will continue to monitor.  Raliegh Ip RN

## 2015-02-03 NOTE — Consult Note (Signed)
PULMONARY / CRITICAL CARE MEDICINE   Name: April Kennedy MRN: [id number] DOB: 01-19-1934    ADMISSION DATE:  02/09/2015 CONSULTATION DATE:  02/15/2015  REFERRING MD :  Dr. 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  Work up concerning for left > right foot ischemia.  Patient was taken to the OR on 7/19 for L endarterectomy and was unable to extubate post-op.  Returned to ICU for monitoring   STUDIES:  7/15  Venous Duplex >> no DVT in RLE    SIGNIFICANT EVENTS: 7/11  R PleurX placed per Dr. Prescott Gum for metastatic effusion  7/15  PET >> small right pleural effusion (pleurX cath in place, placed on 7/11 per Dr. Prescott Gum, PET scan done showed hypermetabolic pleural thickening of the R hemothorax, metastatic spread to lung, mediastinum and L supraclavicular node. 7/15  MRI Brain >> normal appearance of brain 7/15  Admit with L toe pain, swelling.  Concerns for BLE ischemia  7/19  To OR for L endarterectomy   HISTORY OF PRESENT ILLNESS:  81 y/o F, former smoker,  with PMH of HTN, CKD, DM, glaucoma, HLD, and recent diagnosis of Stage IV Metastatic Adenocarcinoma (suspected primary lung, dx by R thoracentesis s/p pleurX catheter) who presented to [facility] ER on 7/15 with toe pain & swelling.    On admit, the patient reported bilateral toe pain for which she has been taking Norco without relief.  This initially started as what she thought to be an ingrown toenail.  She developed worsening swelling and pain which prompted her to visit the ER.  Evaluation was negative for acute fracture or air/gas.  Venous dopplers were assessed and negative.  CXR was completed which showed a small right pleural effusion (pleurX cath in place, placed on 7/11 per Dr. Prescott Gum, PET scan done showed hypermetabolic pleural  thickening of the R hemothorax, metastatic spread to lung, mediastinum and L supraclavicular node.  MRI showed no metastatic spread).  Initial exam was notable for dusky toes, L>R and cool to touch per notes.  Further work up was concerning for left > right foot ischemia.  Patient was taken to the OR on 7/19 for per Dr. Scot Dock for an endarterectomy of left external iliac artery and common femoral artery with vein patch angioplasty (right greater saphenous vein), Embolectomy of anterior tibial artery, posterior tibial artery, and peroneal artery, Vein patch angioplasty of tibial perineal trunk and Posterior we'll artery. (right greater saphenous vein), Endarterectomy of left superficial femoral artery with patch change of plasty (bovine pericardial patch), Endarterectomy of the proximal left posterior tibial artery and was unable to extubate post-operatively. She was returned to ICU for monitoring.  PCCM consulted for ICU evaluation.    PAST MEDICAL HISTORY :   has a past medical history of Hypertension; Glaucoma; Chronic kidney disease; Neuromuscular disorder; Diabetes mellitus without complication; Asthma; and Hyperlipidemia.  has past surgical history that includes Chest tube insertion (Right, 01/26/2015) and Cardiac catheterization (N/A, 02/10/2015).   Prior to Admission medications   Medication Sig Start Date End Date Taking? Authorizing Provider  amLODipine (NORVASC) 10 MG tablet Take 1 tablet (10 mg total) by mouth daily. 01/27/15  Yes Nita Sells, MD  bimatoprost (LUMIGAN) 0.01 % SOLN Place 1 drop into both eyes at bedtime.    Yes Historical  Provider, MD  brimonidine (ALPHAGAN P) 0.1 % SOLN Place 1 drop into both eyes 3 (three) times daily.    Yes Historical Provider, MD  dorzolamide (TRUSOPT) 2 % ophthalmic solution Place 1 drop into the right eye 3 (three) times daily. 01/01/15  Yes Historical Provider, MD  HYDROcodone-acetaminophen (NORCO/VICODIN) 5-325 MG per tablet Take 1 tablet by mouth  every 4 (four) hours as needed for moderate pain or severe pain. 01/27/15  Yes Nita Sells, MD  Multiple Vitamin (MULTIVITAMIN) tablet Take 1 tablet by mouth daily.   Yes Historical Provider, MD  PROAIR HFA 108 (90 BASE) MCG/ACT inhaler Inhale 1-2 puffs into the lungs every 6 (six) hours as needed for wheezing or shortness of breath.  01/12/15  Yes Historical Provider, MD   Allergies  Allergen Reactions  . Sulfa Antibiotics     Hives/ itching     FAMILY HISTORY:  has no family status information on file.    SOCIAL HISTORY:  reports that she has quit smoking. She does not have any smokeless tobacco history on file. She reports that she does not drink alcohol or use illicit drugs.  REVIEW OF SYSTEMS: Unable to complete as patient is altered on mechanical ventilation.   SUBJECTIVE:   VITAL SIGNS: Temp:  [97.6 F (36.4 C)-98.8 F (37.1 C)] 97.6 F (36.4 C) (07/19 1340) Pulse Rate:  [106-129] 112 (07/19 1330) Resp:  [19-27] 19 (07/19 1330) BP: (101-173)/(63-91) 156/91 mmHg (07/19 1315) SpO2:  [91 %-100 %] 92 % (07/19 1330) FiO2 (%):  [50 %] 50 % (07/19 1210)   HEMODYNAMICS:     VENTILATOR SETTINGS: Vent Mode:  [-] PRVC FiO2 (%):  [50 %] 50 % Set Rate:  [14 bmp] 14 bmp Vt Set:  [500 mL] 500 mL PEEP:  [5 cmH20] 5 cmH20 Plateau Pressure:  [29 cmH20] 29 cmH20   INTAKE / OUTPUT:  Intake/Output Summary (Last 24 hours) at 01/22/2015 1358 Last data filed at 02/09/2015 1207  Gross per 24 hour  Intake   1310 ml  Output    445 ml  Net    865 ml    PHYSICAL EXAMINATION: General:  Elderly female in NAD on vent  Neuro:  Awakens to voice, follows commands, MAE  HEENT:  MM pink/moist, OETT, pupils =R Cardiovascular:  s1s2 rrr, no m/r/g Lungs: lungs bilaterally coarse, diminished RLL, R pleruX c/d/i, appears air hungry on vent  Abdomen:  Obese/soft, bsx4 active  Musculoskeletal:  No acute deformities  Skin:  no edema, BLE cool to touch, surgical incision in L femoral area and  L medial calf - clean/dry, JP drain x2 with sanguinous drainage   LABS:  CBC  Recent Labs Lab 01/31/15 0525 02/01/15 0335 01/17/2015 0132  WBC 11.7* 10.3 15.9*  HGB 11.9* 12.2 11.3*  HCT 35.5* 36.0 33.7*  PLT 130* 144* 157   Coag's No results for input(s): APTT, INR in the last 168 hours.   BMET  Recent Labs Lab 01/31/15 0525 02/01/15 0335 02/15/2015 0132  NA 137 137 137  K 4.1 4.4 4.3  CL 105 102 106  CO2 23 24 20*  BUN 18 18 21*  CREATININE 1.76* 1.90* 2.09*  GLUCOSE 168* 130* 129*   Electrolytes  Recent Labs Lab 01/31/15 0525 02/01/15 0335 01/28/2015 0132  CALCIUM 8.3* 8.1* 8.0*  MG 2.1  --   --   PHOS 3.7  --   --    Sepsis Markers  Recent Labs Lab 02/08/2015 2033 01/31/15 0525  LATICACIDVEN 1.71 1.1  ABG No results for input(s): PHART, PCO2ART, PO2ART in the last 168 hours.   Liver Enzymes  Recent Labs Lab 02/04/2015 1700 01/31/15 0525  AST 33 28  ALT 33 31  ALKPHOS 59 53  BILITOT 0.7 0.6  ALBUMIN 2.9* 2.5*   Cardiac Enzymes No results for input(s): TROPONINI, PROBNP in the last 168 hours. Glucose  Recent Labs Lab 02/01/15 2159 01/22/2015 0805 02/08/2015 1145 02/06/2015 1642 01/18/2015 2114 02/04/2015 1213  GLUCAP 115* 116* 104* 89 175* 165*    Imaging No results found.   ASSESSMENT / PLAN:  PULMONARY OETT 7/19 >>  A: Acute Respiratory Failure - unable to extubate post-op in PACU Former Smoker Adenocarcinoma - confirmed in R pleural fluid on thoracentesis, s/p PleurX per Dr. Prescott Gum 7/11 P:   MV support, 8 cc/kg Assess ABG, if wnl will plan for extubation  Follow up CXR post-op  PRN albuterol given smoking history  Follow up with [place] post discharge (daughter reports ONC MD is going to call her today, pt was supposed to be seen soon) Monitor PleurX site, consider drainage schedule based on CXR findings   CARDIOVASCULAR CVL A:  HTN HLD P:  Continue lopressor, ASA, lipitor  ICU monitoring  Assess post-operative  EKG  RENAL A:   Acute on Chronic Renal Failure - baseline CKD IV Metabolic Acidosis  P:   Trend BMP / UOP  Replace electrolytes as indicated  Assess lactic acid  NS at 36m/hr  GASTROINTESTINAL A:   Vent Associated Dysphagia  P:   PPI for SUP  Place OGT, decompress abd  NPO Begin TF later in evening  HEMATOLOGIC A:   Mild Anemia  LE Ischemia s/p  P:  Trend CBC  Heparin gtt per pharmacy  NO SCD's  Vascular checks  JP drains per VVS  INFECTIOUS A:   No acute infectious process  P:   C-Diff 7/19 >>  Vanco, start date 7/15, day 5/x Zosyn, start date 7/15, day 5/x  ENDOCRINE A:  DM   P:   CBG Q4 with SSI  Hold home regimen   NEUROLOGIC A:   Post-Operative Pain  P:   RASS goal: -1 PRN fentanyl for pain  Versed   FAMILY  - Updates: Daughter updated at bedside.     BNoe Gens NP-C [facility] Pgr: [phone] or if no answer [phone]/19/2016, 1:58 PM

## 2015-02-03 NOTE — Progress Notes (Signed)
RT note- patient placed to wean by Dr. Elsworth Soho, ABG in 30 min

## 2015-02-03 NOTE — Progress Notes (Signed)
HEMATOLOGY-ONCOLOGY PROGRESS NOTE  SUBJECTIVE:Intubated, S/P thrombectomy today, in pain  OBJECTIVE: PHYSICAL EXAMINATION: ECOG PERFORMANCE STATUS: 4 - Bedbound  Filed Vitals:   01/25/2015 1600  BP: 107/58  Pulse: 101  Temp:   Resp: 14   Filed Weights   02/10/2015 2312  Weight: 186 lb 12.8 oz (84.732 kg)    GENERAL:awake and alert SKIN: Extremities pale and cyanotic digits LUNGS:Dim BS Right HEART: regular rate & rhythm and no murmurs and no lower extremity edema ABDOMEN:abdomen soft, non-tender and normal bowel sounds Musculoskeletal:no cyanosis of digits and no clubbing  NEURO: Not examined  LABORATORY DATA:  I have reviewed the data as listed CMP Latest Ref Rng 02/10/2015 02/01/2015 01/31/2015  Glucose 65 - 99 mg/dL 129(H) 130(H) 168(H)  BUN 6 - 20 mg/dL 21(H) 18 18  Creatinine 0.44 - 1.00 mg/dL 2.09(H) 1.90(H) 1.76(H)  Sodium 135 - 145 mmol/L 137 137 137  Potassium 3.5 - 5.1 mmol/L 4.3 4.4 4.1  Chloride 101 - 111 mmol/L 106 102 105  CO2 22 - 32 mmol/L 20(L) 24 23  Calcium 8.9 - 10.3 mg/dL 8.0(L) 8.1(L) 8.3(L)  Total Protein 6.5 - 8.1 g/dL - - 6.0(L)  Total Bilirubin 0.3 - 1.2 mg/dL - - 0.6  Alkaline Phos 38 - 126 U/L - - 53  AST 15 - 41 U/L - - 28  ALT 14 - 54 U/L - - 31    Lab Results  Component Value Date   WBC 15.9* 02/10/2015   HGB 11.3* 01/18/2015   HCT 33.7* 01/16/2015   MCV 79.5 02/11/2015   PLT 157 02/08/2015   NEUTROABS 9.0* 01/24/2015    ASSESSMENT AND PLAN: 1. Stage 4 lung adenocarcinoma: Malignant pleural effusion. PET-CT showed a large significant rind of tissue around the lung with extensive mediastinal and hilar and bil supraclav LN. MRI Brain neg and no other distant mets. Foundation one testing pending Dramatic decline in her PS with the arterial ischemia. I briefly discussed that her PS makes it impossible at this time to do any treatments. We will wait to see how she does in the next few days.  Recommended Palliative care  consult. Patient has advanced age and advanced lung cancer with Bilateral limb ischemia.  2. Arterial ischemia: Now on heparin drip D/W Dr.Alva and patients family.

## 2015-02-03 NOTE — Transfer of Care (Signed)
Immediate Anesthesia Transfer of Care Note  Patient: April Kennedy  Procedure(s) Performed: Procedure(s): ENDARTERECTOMY FEMORAL-LEFT (Left) LEFT TIBIAL EMBOLECTOMY (Left) PATCH ANGIOPLASTY FEMORAL ARTERY USING XENOSURE BIOLOGIC PATCH 1cm x 6cm (Left)  Patient Location: PACU  Anesthesia Type:General  Level of Consciousness: Patient remains intubated per anesthesia plan  Airway & Oxygen Therapy: Patient remains intubated per anesthesia plan and Patient placed on Ventilator (see vital sign flow sheet for setting)  Post-op Assessment: Report given to RN and Post -op Vital signs reviewed and stable  Post vital signs: Reviewed and stable  Last Vitals:  Filed Vitals:   02/06/2015 1209  BP:   Pulse:   Temp: 36.8 C  Resp:     Complications: No apparent anesthesia complications

## 2015-02-04 ENCOUNTER — Inpatient Hospital Stay (HOSPITAL_COMMUNITY): Payer: Medicare Other

## 2015-02-04 ENCOUNTER — Encounter (HOSPITAL_COMMUNITY): Payer: Medicare Other

## 2015-02-04 DIAGNOSIS — R0689 Other abnormalities of breathing: Secondary | ICD-10-CM

## 2015-02-04 DIAGNOSIS — Z515 Encounter for palliative care: Secondary | ICD-10-CM

## 2015-02-04 DIAGNOSIS — L03115 Cellulitis of right lower limb: Secondary | ICD-10-CM

## 2015-02-04 LAB — GLUCOSE, CAPILLARY
GLUCOSE-CAPILLARY: 107 mg/dL — AB (ref 65–99)
GLUCOSE-CAPILLARY: 113 mg/dL — AB (ref 65–99)
GLUCOSE-CAPILLARY: 126 mg/dL — AB (ref 65–99)
Glucose-Capillary: 114 mg/dL — ABNORMAL HIGH (ref 65–99)
Glucose-Capillary: 124 mg/dL — ABNORMAL HIGH (ref 65–99)
Glucose-Capillary: 125 mg/dL — ABNORMAL HIGH (ref 65–99)

## 2015-02-04 LAB — BASIC METABOLIC PANEL
Anion gap: 12 (ref 5–15)
BUN: 30 mg/dL — ABNORMAL HIGH (ref 6–20)
CALCIUM: 7.5 mg/dL — AB (ref 8.9–10.3)
CO2: 17 mmol/L — AB (ref 22–32)
Chloride: 107 mmol/L (ref 101–111)
Creatinine, Ser: 2.85 mg/dL — ABNORMAL HIGH (ref 0.44–1.00)
GFR, EST AFRICAN AMERICAN: 17 mL/min — AB (ref >60)
GFR, EST NON AFRICAN AMERICAN: 15 mL/min — AB (ref >60)
GLUCOSE: 126 mg/dL — AB (ref 65–99)
Potassium: 4.4 mmol/L (ref 3.5–5.1)
SODIUM: 136 mmol/L (ref 135–145)

## 2015-02-04 LAB — HEPARIN LEVEL (UNFRACTIONATED)
Heparin Unfractionated: <0.1 IU/mL — ABNORMAL LOW (ref 0.30–0.70)
Heparin Unfractionated: 0.31 IU/mL (ref 0.30–0.70)

## 2015-02-04 LAB — CBC
HCT: 28.1 % — ABNORMAL LOW (ref 36.0–46.0)
Hemoglobin: 9.2 g/dL — ABNORMAL LOW (ref 12.0–15.0)
MCH: 26.6 pg (ref 26.0–34.0)
MCHC: 32.7 g/dL (ref 30.0–36.0)
MCV: 81.2 fL (ref 78.0–100.0)
Platelets: 165 10*3/uL (ref 150–400)
RBC: 3.46 MIL/uL — ABNORMAL LOW (ref 3.87–5.11)
RDW: 14.8 % (ref 11.5–15.5)
WBC: 18.6 10*3/uL — ABNORMAL HIGH (ref 4.0–10.5)

## 2015-02-04 LAB — PHOSPHORUS: Phosphorus: 6.6 mg/dL — ABNORMAL HIGH (ref 2.5–4.6)

## 2015-02-04 LAB — MAGNESIUM: Magnesium: 2.1 mg/dL (ref 1.7–2.4)

## 2015-02-04 MED ORDER — CETYLPYRIDINIUM CHLORIDE 0.05 % MT LIQD
7.0000 mL | Freq: Two times a day (BID) | OROMUCOSAL | Status: DC
  Administered 2015-02-04 – 2015-02-07 (×5): 7 mL via OROMUCOSAL

## 2015-02-04 MED ORDER — DIPHENHYDRAMINE HCL 12.5 MG/5ML PO ELIX
12.5000 mg | ORAL_SOLUTION | Freq: Four times a day (QID) | ORAL | Status: DC | PRN
  Filled 2015-02-04: qty 5

## 2015-02-04 MED ORDER — FENTANYL 10 MCG/ML IV SOLN
INTRAVENOUS | Status: DC
  Administered 2015-02-04: 60 ug via INTRAVENOUS
  Administered 2015-02-04: 6 mL via INTRAVENOUS
  Filled 2015-02-04: qty 50

## 2015-02-04 MED ORDER — FENTANYL 10 MCG/ML IV SOLN
INTRAVENOUS | Status: DC
  Administered 2015-02-04: 23:00:00 via INTRAVENOUS
  Administered 2015-02-05: 311.8 ug via INTRAVENOUS
  Administered 2015-02-05: 275.6 ug via INTRAVENOUS
  Administered 2015-02-05: 06:00:00 via INTRAVENOUS
  Administered 2015-02-05: 257.4 ug via INTRAVENOUS
  Administered 2015-02-05: 11:00:00 via INTRAVENOUS
  Administered 2015-02-05: 430.8 ug via INTRAVENOUS
  Filled 2015-02-04 (×3): qty 50

## 2015-02-04 MED ORDER — DIPHENHYDRAMINE HCL 50 MG/ML IJ SOLN: 12.5000 mg | Freq: Four times a day (QID) | INTRAMUSCULAR | Status: DC | PRN

## 2015-02-04 MED ORDER — FENTANYL 10 MCG/ML IV SOLN
INTRAVENOUS | Status: DC
  Administered 2015-02-04: 11:00:00 via INTRAVENOUS
  Administered 2015-02-04: 3 mL via INTRAVENOUS
  Filled 2015-02-04: qty 50

## 2015-02-04 MED ORDER — PIPERACILLIN-TAZOBACTAM IN DEX 2-0.25 GM/50ML IV SOLN
2.2500 g | Freq: Three times a day (TID) | INTRAVENOUS | Status: DC
  Administered 2015-02-04 – 2015-02-09 (×15): 2.25 g via INTRAVENOUS
  Filled 2015-02-04 (×17): qty 50

## 2015-02-04 MED ORDER — NALOXONE HCL 0.4 MG/ML IJ SOLN: 0.4000 mg | INTRAMUSCULAR | Status: DC | PRN

## 2015-02-04 MED ORDER — PANTOPRAZOLE SODIUM 40 MG PO TBEC
40.0000 mg | DELAYED_RELEASE_TABLET | Freq: Every day | ORAL | Status: DC
  Administered 2015-02-04 – 2015-02-06 (×3): 40 mg via ORAL
  Filled 2015-02-04 (×3): qty 1

## 2015-02-04 MED ORDER — ONDANSETRON HCL 4 MG/2ML IJ SOLN: 4.0000 mg | Freq: Four times a day (QID) | INTRAMUSCULAR | Status: DC | PRN

## 2015-02-04 MED ORDER — SODIUM CHLORIDE 0.9 % IJ SOLN: 9.0000 mL | INTRAMUSCULAR | Status: DC | PRN

## 2015-02-04 NOTE — Consult Note (Signed)
Consultation Note Date: 02/04/2015   Patient Name: April Kennedy  DOB: September 10, 1933  MRN: [id number]  Age / Sex: 81 y.o., female   PCP: Rogers Blocker, MD Referring Physician: Chesley Mires, MD  Reason for Consultation: Establishing goals of care, Pain control and Psychosocial/spiritual support  Palliative Care Assessment and Plan Summary of Established Goals of Care and Medical Treatment Preferences    Palliative Care Discussion Held Today:    This NP Wadie Lessen reviewed medical records, received report from team, assessed the patient and then meet at the patient's bedside along with her daughter April Kennedy to introduce both to the Palliative Medicine team and our role in her holisticcare plan.   A  discussion was had today introducing the importance of  advanced directives.   Values and goals of care important to patient and family were attempted to be elicited.  Questions and concerns addressed.   Family encouraged to call with questions or concerns.  PMT will continue to support holistically.  Both patient and her daughter wish to meet again with other family members, Colletta Maryland will call me back with a good time to meet again in the next 24-48 hrs   Primary Decision Maker: April Kennedy and April Kennedy are HPOAs  Goals of Care/Code Status/Advance Care Planning:  Open to all offered and available medical interventions to prolong life, remains hopeful for improvement  Symptom Management:    Pain:  BLE- currently on PCA Fentanyl, dose increase    Psycho-social/Spiritual:   Support System: large family  Desire for further Chaplaincy support:          Chief Complaint: BLE pain  History of Present Illness:   81 y/o F with PMH of HTN, CKD, DM, and recent diagnosis of Stage IV Metastatic Adenocarcinoma (suspected primary lung) admitted on 7/15 with toe pain & swelling. Work up concerning for left > right foot ischemia. Patient was taken to the OR on 7/19 for L  endarterectomy   Stage 4 lung adenocarcinoma: Malignant pleural effusion. PET-CT showed a large significant rind of tissue around the lung with extensive mediastinal and hilar and bil supraclav LN. MRI Brain neg and no other distant mets.  Dramatic decline in her PS with the arterial ischemia  Primary Diagnoses  Present on Admission:  . Pleural effusion, right . Essential hypertension . Chronic kidney disease, stage 3 . Left leg cellulitis . Cellulitis . Foot pain, left . Malignant pleural effusion  Palliative Review of Systems:    -BLE pain, weakness   I have reviewed the medical record, interviewed the patient and family, and examined the patient. The following aspects are pertinent.  Past Medical History  Diagnosis Date  . Hypertension   . Glaucoma   . Chronic kidney disease   . Neuromuscular disorder   . Diabetes mellitus without complication   . Asthma   . Hyperlipidemia   . Metastatic adenocarcinoma 01/2015     suspected lung primary    History   Social History  . Marital Status: Widowed    Spouse Name: N/A  . Number of Children: N/A  . Years of Education: N/A   Social History Main Topics  . Smoking status: Former Research scientist (life sciences)  . Smokeless tobacco: Not on file  . Alcohol Use: No  . Drug Use: No  . Sexual Activity: Not on file   Other Topics Concern  . None   Social History Narrative   Family History  Problem Relation Age of Onset  . Hypertension  Scheduled Meds: . antiseptic oral rinse  7 mL Mouth Rinse QID  . aspirin EC  81 mg Oral Daily  . atorvastatin  20 mg Oral q1800  . brimonidine  1 drop Both Eyes TID  . chlorhexidine  15 mL Mouth Rinse BID  . dorzolamide  1 drop Right Eye TID  . fentaNYL   Intravenous 6 times per day  . insulin aspart  0-15 Units Subcutaneous 6 times per day  . latanoprost  1 drop Both Eyes QHS  . metoprolol tartrate  25 mg Oral BID  . pantoprazole (PROTONIX) IV  40 mg Intravenous Q24H  . piperacillin-tazobactam  (ZOSYN)  IV  2.25 g Intravenous 3 times per day  . sodium chloride  3 mL Intravenous Q12H  . vancomycin  750 mg Intravenous Q24H   Continuous Infusions: . sodium chloride 75 mL/hr at 02/04/15 1100  . heparin 1,100 Units/hr (02/04/15 1100)   PRN Meds:.[DISCONTINUED] acetaminophen **OR** acetaminophen, acetaminophen, albuterol, diphenhydrAMINE **OR** diphenhydrAMINE, hydrALAZINE, ondansetron **OR** [DISCONTINUED] ondansetron (ZOFRAN) IV Medications Prior to Admission:  Prior to Admission medications   Medication Sig Start Date End Date Taking? Authorizing Provider  amLODipine (NORVASC) 10 MG tablet Take 1 tablet (10 mg total) by mouth daily. 01/27/15  Yes Nita Sells, MD  bimatoprost (LUMIGAN) 0.01 % SOLN Place 1 drop into both eyes at bedtime.    Yes Historical Provider, MD  brimonidine (ALPHAGAN P) 0.1 % SOLN Place 1 drop into both eyes 3 (three) times daily.    Yes Historical Provider, MD  dorzolamide (TRUSOPT) 2 % ophthalmic solution Place 1 drop into the right eye 3 (three) times daily. 01/01/15  Yes Historical Provider, MD  HYDROcodone-acetaminophen (NORCO/VICODIN) 5-325 MG per tablet Take 1 tablet by mouth every 4 (four) hours as needed for moderate pain or severe pain. 01/27/15  Yes Nita Sells, MD  Multiple Vitamin (MULTIVITAMIN) tablet Take 1 tablet by mouth daily.   Yes Historical Provider, MD  PROAIR HFA 108 (90 BASE) MCG/ACT inhaler Inhale 1-2 puffs into the lungs every 6 (six) hours as needed for wheezing or shortness of breath.  01/12/15  Yes Historical Provider, MD   Allergies  Allergen Reactions  . Sulfa Antibiotics     Hives/ itching    CBC:    Component Value Date/Time   WBC 18.6* 02/04/2015 0225   WBC 4.2 06/28/2012   HGB 9.2* 02/04/2015 0225   HCT 28.1* 02/04/2015 0225   PLT 165 02/04/2015 0225   MCV 81.2 02/04/2015 0225   NEUTROABS 9.0* 01/24/2015 1700   LYMPHSABS 1.8 02/11/2015 1700   MONOABS 1.2* 01/25/2015 1700   EOSABS 0.2 01/20/2015 1700    BASOSABS 0.0 02/11/2015 1700   Comprehensive Metabolic Panel:    Component Value Date/Time   NA 136 02/04/2015 0225   NA 141 06/28/2012   K 4.4 02/04/2015 0225   CL 107 02/04/2015 0225   CO2 17* 02/04/2015 0225   BUN 30* 02/04/2015 0225   BUN 24* 06/28/2012   CREATININE 2.85* 02/04/2015 0225   CREATININE 1.9* 06/28/2012   GLUCOSE 126* 02/04/2015 0225   CALCIUM 7.5* 02/04/2015 0225   AST 28 01/31/2015 0525   ALT 31 01/31/2015 0525   ALKPHOS 53 01/31/2015 0525   BILITOT 0.6 01/31/2015 0525   PROT 6.0* 01/31/2015 0525   ALBUMIN 2.5* 01/31/2015 0525    Physical Exam:  Vital Signs: BP 130/59 mmHg  Pulse 146  Temp(Src) 98.1 F (36.7 C) (Oral)  Resp 25  Ht '5\' 4"'$  (1.626 m)  Wt  87.5 kg (192 lb 14.4 oz)  BMI 33.10 kg/m2  SpO2 96% SpO2: SpO2: 96 % O2 Device: O2 Device: Nasal Cannula O2 Flow Rate: O2 Flow Rate (L/min): 2 L/min Intake/output summary:  Intake/Output Summary (Last 24 hours) at 02/04/15 1353 Last data filed at 02/04/15 1100  Gross per 24 hour  Intake 1635.29 ml  Output    720 ml  Net 915.29 ml   LBM: Last BM Date: 02/01/15 Baseline Weight: Weight: 84.732 kg (186 lb 12.8 oz) Most recent weight: Weight: 87.5 kg (192 lb 14.4 oz)  Exam Findings:   General: chronically ill appearing, appears generally uncomfortable HEENT: moist buccal membranes, no exudate CVS: tachcardic Extrem: left foot mottled, cool Neuro: alert and oriented X3           Palliative Performance Scale: 30 % at best                Additional Data Reviewed: Recent Labs     02/09/2015  1717  02/04/15  0225  WBC  19.3*  18.6*  HGB  9.6*  9.2*  PLT  150  165  NA  137  136  BUN  28*  30*  CREATININE  2.67*  2.85*     Time In: 1415 Time Out: 1530 Time Total: 75 min  Greater than 50%  of this time was spent counseling and coordinating care related to the above assessment and plan.  Discussed with  Dr Lindi Adie and Dr Ashok Cordia  Signed by: Wadie Lessen, NP  Knox Royalty, NP    02/04/2015, 1:53 PM  Please contact Palliative Medicine Team phone at [phone] for questions and concerns.   See AMION for contact information

## 2015-02-04 NOTE — Progress Notes (Signed)
eLink Physician-Brief Progress Note Patient Name: April Kennedy DOB: 06-28-1934 MRN: [id number]   Date of Service  02/04/2015  HPI/Events of Note  Patient with continued 10/10 pain despite PCA.   eICU Interventions  Patient PCA continuous infusion started at 47mg/hr.       Intervention Category Intermediate Interventions: Pain - evaluation and management  JTera Partridge7/20/2016, 8:34 PM

## 2015-02-04 NOTE — Progress Notes (Signed)
ANTIBIOTIC CONSULT NOTE - Follow Up Consult  Pharmacy Consult for Vancomcycin and zosyn Indication: cellulitis  Allergies  Allergen Reactions  . Sulfa Antibiotics     Hives/ itching     Patient Measurements: Height: '5\' 4"'$  (162.6 cm) Weight: 192 lb 14.4 oz (87.5 kg) IBW/kg (Calculated) : 54.7 Heparin Dosing Weight: 74 kg  Vital Signs: Temp: 98.1 F (36.7 C) (07/20 0810) Temp Source: Oral (07/20 0810) BP: 130/59 mmHg (07/20 0600) Pulse Rate: 146 (07/20 0857)  Labs:  Recent Labs  01/31/2015 0132 01/18/2015 1717 02/04/15 0225  HGB 11.3* 9.6* 9.2*  HCT 33.7* 29.5* 28.1*  PLT 157 150 165  HEPARINUNFRC 0.20*  --  <0.10*  CREATININE 2.09* 2.67* 2.85*    Estimated Creatinine Clearance: 17.4 mL/min (by C-G formula based on Cr of 2.85).  Assessment:    Day # 5 Vanc and Zosyn for cellulitis. POD #1 s/p endarterectomy/embolectomy- likely reclotted her tibials despite heparin.  Tmax 99.9; WBC up to 18.6. Creatinine up to 2.85. Creat Cl ~ 17-22 ml/min.  No culture data  [place] 7/15 x1 Zosyn 7/16>> vanc 7/16>>   Goal of Therapy:  Vancomycin trough levels 10-15 mcg/ml   Plan:   -Continue Vancomycin 750 mg IV q24hrs. -consider vanc trough tomorrow night -decrease zosyn dose to 2.5 gm IV q8h for decreased renal clearance.   - f/u LOT for abx  Eudelia Bunch, Pharm.D. [phone] 02/04/2015 12:19 PM

## 2015-02-04 NOTE — Procedures (Signed)
Extubation Procedure Note  Patient Details:   Name: April Kennedy DOB: 07/05/1934 MRN: [id number]   Airway Documentation:  Airway 7.5 mm (Active)  Secured at (cm) 23 cm 02/04/2015  7:55 AM  Measured From Lips 02/04/2015  7:55 AM  Secured Location Left 02/04/2015  7:55 AM  Secured By [employer] 02/04/2015  7:55 AM  Tube Holder Repositioned Yes 02/04/2015  7:55 AM  Cuff Pressure (cm H2O) 20 cm H2O 02/04/2015  7:55 AM  Site Condition Dry 02/04/2015  7:55 AM    Evaluation  O2 sats: stable throughout Complications: No apparent complications Patient did tolerate procedure well. Bilateral Breath Sounds: Rhonchi, Clear Suctioning: Airway Yes  Extubated patient to 2L nasal canulal vital signs stable at thiis time.  [name] 02/04/2015, 8:59 AM

## 2015-02-04 NOTE — Progress Notes (Signed)
Per Georgina Snell, pharmacy, PCA Bolus+Continuous can be ordered using "7. Additional Instructions", no other way was found. He will follow up with supervisor regarding the correct way to order this. PCA now running as previously ordered plus basal rate of 50 mcg/hr continuous.

## 2015-02-04 NOTE — Progress Notes (Signed)
120 cc of fentanyl wasted. jennifer Bishop witness.

## 2015-02-04 NOTE — Progress Notes (Signed)
ANTICOAGULATION CONSULT NOTE - Initial Consult  Pharmacy Consult for Heparin  Indication: Ischemic left lower extremity   Allergies  Allergen Reactions  . Sulfa Antibiotics     Hives/ itching    Patient Measurements: Height: '5\' 4"'$  (162.6 cm) Weight: 192 lb 14.4 oz (87.5 kg) IBW/kg (Calculated) : 54.7  Vital Signs: Temp: 98.8 F (37.1 C) (07/20 0416) Temp Source: Oral (07/20 0416) BP: 100/64 mmHg (07/20 0445) Pulse Rate: 120 (07/20 0515)  Labs:  Recent Labs  02/01/2015 0132 02/01/2015 1717 02/04/15 0225  HGB 11.3* 9.6* 9.2*  HCT 33.7* 29.5* 28.1*  PLT 157 150 165  HEPARINUNFRC 0.20*  --  <0.10*  CREATININE 2.09* 2.67* 2.85*    Estimated Creatinine Clearance: 17.4 mL/min (by C-G formula based on Cr of 2.85).   Medical History: Past Medical History  Diagnosis Date  . Hypertension   . Glaucoma   . Chronic kidney disease   . Neuromuscular disorder   . Diabetes mellitus without complication   . Asthma   . Hyperlipidemia   . Metastatic adenocarcinoma 01/2015     suspected lung primary    Assessment: Ischemic left LE, high risk for thrombosis per VVS, Hgb 9.2 this AM, pt had been on 700 units/hr of heparin per VVS with orders to increase to full dose heparin this AM at 0600. Pt has a sub-therapeutic heparin level on 1050 units/hr early in the AM on 7/19.   Goal of Therapy:  Heparin level 0.3-0.7 units/ml Monitor platelets by anticoagulation protocol: Yes   Plan:  -Increase to full dose heparin at 1100 units/hr -Check 1400 HL -Daily CBC/HL -Monitor for bleeding, trend Hgb  Narda Bonds 02/04/2015,6:07 AM

## 2015-02-04 NOTE — Consult Note (Signed)
PULMONARY / CRITICAL CARE MEDICINE   Name: April Kennedy MRN: [id number] DOB: 1933/11/23    ADMISSION DATE:  02/09/2015 CONSULTATION DATE:  01/19/2015  REFERRING MD :  Dr. 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  Work up concerning for left > right foot ischemia.  Patient was taken to the OR on 7/19 for L endarterectomy and was unable to extubate post-op.  Returned to ICU for monitoring   STUDIES:  7/15  Venous Duplex >> no DVT in RLE    SIGNIFICANT EVENTS: 7/11  R PleurX placed per Dr. Prescott Gum for metastatic effusion  7/15  PET >> small right pleural effusion (pleurX cath in place, placed on 7/11 per Dr. Prescott Gum, PET scan done showed hypermetabolic pleural thickening of the R hemothorax, metastatic spread to lung, mediastinum and L supraclavicular node. 7/15  MRI Brain >> normal appearance of brain 7/15  Admit with L toe pain, swelling.  Concerns for BLE ischemia  7/19  To OR for L endarterectomy 7/20 Extubation  SUBJECTIVE:  Patient doing well this AM. Extubated. Tolerating [place] well.  VITAL SIGNS: Temp:  [98.1 F (36.7 C)-99.9 F (37.7 C)] 98.1 F (36.7 C) (07/20 0810) Pulse Rate:  [93-146] 146 (07/20 0857) Resp:  [11-31] 25 (07/20 1217) BP: (89-146)/(43-84) 130/59 mmHg (07/20 0600) SpO2:  [95 %-100 %] 96 % (07/20 1217) FiO2 (%):  [40 %-50 %] 40 % (07/20 0800) Weight:  [192 lb 14.4 oz (87.5 kg)] 192 lb 14.4 oz (87.5 kg) (07/20 0449)   HEMODYNAMICS:     VENTILATOR SETTINGS: Vent Mode:  [-] PSV FiO2 (%):  [40 %-50 %] 40 % Set Rate:  [14 bmp-18 bmp] 18 bmp Vt Set:  [440 mL] 440 mL PEEP:  [5 cmH20] 5 cmH20 Pressure Support:  [5 cmH20] 5 cmH20 Plateau Pressure:  [8 FAO13-08 cmH20] 18 cmH20   INTAKE / OUTPUT:  Intake/Output Summary (Last 24 hours) at 02/04/15 1356 Last  data filed at 02/04/15 1100  Gross per 24 hour  Intake 1635.29 ml  Output    720 ml  Net 915.29 ml    PHYSICAL EXAMINATION: General:  Elderly female in NAD; speaking clearly Neuro:  Alert, oriented, answers questions appropriately HEENT:  MM pink/moist, PERRL Cardiovascular:  RRR, no murmur Lungs: lungs bilaterally coarse, diminished RLL, R pleruX c/d/i Abdomen:  Obese/soft, bsx4 active  Musculoskeletal:  No acute deformities  Skin:  no edema, LLE cool to touch, RLE w/ +2 pitting edema (but warm), surgical incision in L femoral area and L medial calf - clean/dry, JP drain x2 with sanguinous drainage   LABS:  CBC  Recent Labs Lab 01/29/2015 0132 02/13/2015 1717 02/04/15 0225  WBC 15.9* 19.3* 18.6*  HGB 11.3* 9.6* 9.2*  HCT 33.7* 29.5* 28.1*  PLT 157 150 165   Coag's No results for input(s): APTT, INR in the last 168 hours.   BMET  Recent Labs Lab 01/21/2015 0132 01/26/2015 1717 02/04/15 0225  NA 137 137 136  K 4.3 4.2 4.4  CL 106 107 107  CO2 20* 18* 17*  BUN 21* 28* 30*  CREATININE 2.09* 2.67* 2.85*  GLUCOSE 129* 130* 126*   Electrolytes  Recent Labs Lab 01/31/15 0525  02/08/2015 0132 01/21/2015 1717 02/04/15 0225  CALCIUM 8.3*  < > 8.0* 7.6* 7.5*  MG 2.1  --   --   --  2.1  PHOS 3.7  --   --   --  6.6*  < > = values in this interval not displayed. Sepsis Markers  Recent Labs Lab 02/09/2015 2033 01/31/15 0525 01/19/2015 1714  LATICACIDVEN 1.71 1.1 1.6   ABG  Recent Labs Lab 02/05/2015 1515  PHART 7.260*  PCO2ART 38.4  PO2ART 70.0*     Liver Enzymes  Recent Labs Lab 02/13/2015 1700 01/31/15 0525  AST 33 28  ALT 33 31  ALKPHOS 59 53  BILITOT 0.7 0.6  ALBUMIN 2.9* 2.5*   Cardiac Enzymes No results for input(s): TROPONINI, PROBNP in the last 168 hours. Glucose  Recent Labs Lab 01/18/2015 1655 02/15/2015 2002 02/06/2015 2351 02/04/15 0411 02/04/15 0757 02/04/15 1120  GLUCAP 116* 109* 107* 126* 113* 114*    Imaging Dg Chest Port 1  View  02/04/2015   CLINICAL DATA:  Respiratory failure, intubated patient metastatic malignancy  EXAM: PORTABLE CHEST - 1 VIEW  COMPARISON:  Portable chest x-ray of February 03, 2015  FINDINGS: The left lung is well-expanded. There is better visualization of the left hemidiaphragm today. On the right there is persistent volume loss. Persistent pleural fluid or pleural thickening lies at the right lung base and in the apex. The interstitial markings of the right lung remain increased. The cardiac silhouette is enlarged. The pulmonary vascularity is not engorged. The endotracheal tube tip lies 3.2 cm above the carina. The esophagogastric tube projects below the inferior margin of the image. There is no pneumothorax. The bony thorax exhibits no acute abnormality. There are degenerative changes of the left shoulder.  IMPRESSION: 1. Persistent volume loss, interstitial edema, and loculated pleural effusions on the right. 2. Slight interval improvement in the appearance of the left lung base with faint visualization of the hemidiaphragm. 3. Mild stable cardiomegaly.   Electronically Signed   By: David  Martinique M.D.   On: 02/04/2015 07:14   Dg Chest Port 1 View  02/06/2015   CLINICAL DATA:  Acute respiratory failure.  EXAM: PORTABLE CHEST - 1 VIEW  COMPARISON:  Chest x-ray dated 01/20/2015.  FINDINGS: The right-sided pleural effusion, likely loculated to some degree, appears slightly more prominent compared to the previous chest x-ray. The extension of the effusion to the right lung apex and along the right lateral margin of the mediastinum may be an indication of this presumed enlargement or could be merely related to the patient's semi-erect positioning.  There is a small left pleural effusion that is probably stable.  Slightly increased interstitial thickening is noted on the right which is consistent with interstitial edema. Heart size is grossly stable although partially obscured by the aforementioned effusion.  Endotracheal tube appears adequately positioned with tip just above the level of the carina. No pneumothorax.  IMPRESSION: 1. Probable slight enlargement of the right-sided pleural effusion which appears to be loculated some degree, as described above. 2. At least mildly increased interstitial prominence within the right lung most suggestive of interstitial edema. 3. Small left pleural effusion is stable. Left lung remains otherwise relatively clear. 4. Endotracheal tube has been placed in the interval and appears adequately positioned with tip above the level of the carina.   Electronically Signed   By: Franki Cabot M.D.   On: 01/23/2015 15:14   Dg Abd Portable 1v  02/08/2015   CLINICAL DATA:  Metastatic adenocarcinoma, suspected lung primary, orogastric tube placement  EXAM: PORTABLE ABDOMEN - 1 VIEW  COMPARISON:  PET-CT 01/30/2015 and earlier studies  FINDINGS: Nasogastric tube extends  just beyond the GE junction. The stomach is decompressed. Multiple gas distended small bowel loops in the mid abdomen. Loculated lateral right pleural effusion with PleurX catheter placed medially.  IMPRESSION: 1. Nasogastric tube into decompressed stomach. 2. Loculated right pleural effusion.   Electronically Signed   By: Lucrezia Europe M.D.   On: 01/18/2015 21:31     ASSESSMENT / PLAN:  PULMONARY OETT 7/19 >> 7/20 A: Acute Respiratory Failure - unable to extubate post-op in PACU Former Smoker Adenocarcinoma - confirmed in R pleural fluid on thoracentesis, s/p PleurX per Dr. Prescott Gum 7/11 P:   MV support, 8 cc/kg Extubated, monitor closely for decomp PRN albuterol given smoking history  Follow up with ONC post discharge (daughter reports ONC MD is going to call her today, pt was supposed to be seen soon) Monitor PleurX site, consider drainage schedule based on CXR findings   CARDIOVASCULAR CVL A:  HTN HLD P:  Continue lopressor, ASA, lipitor  ICU monitoring   RENAL A:   Acute on Chronic Renal Failure  - baseline CKD IV Metabolic Acidosis  P:   Trend BMP / UOP  Replace electrolytes as indicated  Lactic acid wnl NS at 66m/hr  GASTROINTESTINAL A:   Vent Associated Dysphagia  P:   PPI for SUP  Place OGT, decompress abd  NPO Begin TF later in evening  HEMATOLOGIC A:   Mild Anemia  LE Ischemia s/p  P:  Trend CBC  Heparin gtt per pharmacy  NO SCD's  Vascular checks  JP drains per VVS  INFECTIOUS A:   No acute infectious process  P:   C-Diff 7/19 >>  Vanco 7/15 >> Zosyn 7/15 >>  ENDOCRINE A:  DM   P:   CBG Q4 with SSI  Hold home regimen   NEUROLOGIC A:   Post-Operative Pain  P:   RASS goal: 0 fentanyl PCA  FAMILY  - Updates: Daughter updated at bedside.     IElberta Leatherwood MD,MS,  PGY2 02/04/2015 1:56 PM  She did well with SBT this AM and extubated.  She denies chest pain.  Has pain in leg.  Mild dyspnea after extubation.  No wheeze, heart rate regular, diminished BS at Rt base.  Will train pleurx intermittently as needed.  Continue heparin gtt per VVS.  Fentanyl PCA for pain control.  Keep in ICU through today after extubation.  CC time by me independent of resident time is 35 minutes.  VChesley Mires MD [facility]San[facility] FargoPulmonary/Critical Care 02/04/2015, 3:50 PM Pager:  [phone] 3pm call: [phone]

## 2015-02-04 NOTE — Progress Notes (Signed)
[place] for Heparin  Indication: Ischemic left lower extremity   Allergies  Allergen Reactions  . Sulfa Antibiotics     Hives/ itching    Patient Measurements: Height: '5\' 4"'$  (162.6 cm) Weight: 192 lb 14.4 oz (87.5 kg) IBW/kg (Calculated) : 54.7  Vital Signs: Temp: 98.1 F (36.7 C) (07/20 0810) Temp Source: Oral (07/20 0810) BP: 130/59 mmHg (07/20 0600) Pulse Rate: 146 (07/20 0857)  Labs:  Recent Labs  01/26/2015 0132 02/15/2015 1717 02/04/15 0225 02/04/15 1408  HGB 11.3* 9.6* 9.2*  --   HCT 33.7* 29.5* 28.1*  --   PLT 157 150 165  --   HEPARINUNFRC 0.20*  --  <0.10* 0.31  CREATININE 2.09* 2.67* 2.85*  --     Estimated Creatinine Clearance: 17.4 mL/min (by C-G formula based on Cr of 2.85).   Assessment: Ischemic left LE, high risk for thrombosis per VVS, Hgb 9.2 this AM, pt had been on 700 units/hr of heparin per VVS with orders to increase to full dose heparin this AM at 0600. Pt has a sub-therapeutic heparin level on 1050 units/hr early in the AM on 7/19.  HL therapeutic at 0.31 after drip increased to 1100 units/hr. No doppler flow to LLE - likely reclotted tibials - could require amputation if L leg continues to progress.  CBC 9.2/28.1 plct wnl.  No bleeding reported.  Goal of Therapy:  Heparin level 0.3-0.7 units/ml Monitor platelets by anticoagulation protocol: Yes   Plan:  -Increase heparin to 1200 units/hr -Daily CBC/HL -change IV PPI to PO - ? DC now that she is extubated?  Eudelia Bunch, Pharm.D. [phone] 02/04/2015 2:52 PM

## 2015-02-04 NOTE — Progress Notes (Signed)
   VASCULAR SURGERY ASSESSMENT & PLAN:  * 1 Day Post-Op s/p: 1. Endarterectomy of left external iliac artery and common femoral artery with vein patch angioplasty (right GSV) 2. Embolectomy of anterior tibial artery, posterior tibial artery, and peroneal arteries. 3. Vein patch angioplasty of tibial perineal trunk and posterior tibial artery. (right GSV) 4. Endarterectomy of left superficial femoral artery with patch angioplasty (bovine pericardial patch) 5. Endarterectomy of the proximal left posterior tibial artery.  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 I do not think that repeat tibial thrombectomy has any chance of success. She likely re-clotted her tibials despite heparin.   * Will follow. She could potentially require amputation if left leg continues to progress.   SUBJECTIVE: On vent. Alert  PHYSICAL EXAM: Filed Vitals:   02/04/15 0515 02/04/15 0530 02/04/15 0545 02/04/15 0600  BP:  100/51 115/55 130/59  Pulse: 120 114 113 110  Temp:      TempSrc:      Resp: '31 18 19 20  '$ Height:      Weight:      SpO2: 96% 98% 96% 97%   No doppler flow left LE Right LE with good doppler flow.  Incisions ok Groin Drain =  80 cc serous drainage last shift Below knee drain = 20 cc last shift   LABS: Lab Results  Component Value Date   WBC 18.6* 02/04/2015   HGB 9.2* 02/04/2015   HCT 28.1* 02/04/2015   MCV 81.2 02/04/2015   PLT 165 02/04/2015   Lab Results  Component Value Date   CREATININE 2.85* 02/04/2015   Lab Results  Component Value Date   INR 1.18 01/26/2015   CBG (last 3)   Recent Labs  01/20/2015 2002 01/21/2015 2351 02/04/15 0411  GLUCAP 109* 107* 126*    Active Problems:   Pleural effusion, right   Essential hypertension   Chronic kidney disease, stage 3   Left leg cellulitis   Cellulitis   Foot pain, left   Malignant pleural  effusion   Gae Gallop Beeper: [phone] 02/04/2015

## 2015-02-05 ENCOUNTER — Inpatient Hospital Stay (HOSPITAL_COMMUNITY): Payer: Medicare Other

## 2015-02-05 ENCOUNTER — Ambulatory Visit (HOSPITAL_COMMUNITY): Payer: Medicare Other

## 2015-02-05 ENCOUNTER — Encounter (HOSPITAL_COMMUNITY): Payer: Self-pay | Admitting: Vascular Surgery

## 2015-02-05 DIAGNOSIS — Z515 Encounter for palliative care: Secondary | ICD-10-CM

## 2015-02-05 DIAGNOSIS — Z7189 Other specified counseling: Secondary | ICD-10-CM

## 2015-02-05 DIAGNOSIS — L03116 Cellulitis of left lower limb: Secondary | ICD-10-CM

## 2015-02-05 DIAGNOSIS — M79672 Pain in left foot: Secondary | ICD-10-CM

## 2015-02-05 LAB — CBC
HCT: 24.8 % — ABNORMAL LOW (ref 36.0–46.0)
Hemoglobin: 8.2 g/dL — ABNORMAL LOW (ref 12.0–15.0)
MCH: 26.2 pg (ref 26.0–34.0)
MCHC: 33.1 g/dL (ref 30.0–36.0)
MCV: 79.2 fL (ref 78.0–100.0)
Platelets: 215 10*3/uL (ref 150–400)
RBC: 3.13 MIL/uL — ABNORMAL LOW (ref 3.87–5.11)
RDW: 14.5 % (ref 11.5–15.5)
WBC: 20.1 10*3/uL — ABNORMAL HIGH (ref 4.0–10.5)

## 2015-02-05 LAB — BASIC METABOLIC PANEL
Anion gap: 10 (ref 5–15)
BUN: 35 mg/dL — ABNORMAL HIGH (ref 6–20)
CO2: 18 mmol/L — ABNORMAL LOW (ref 22–32)
CREATININE: 2.96 mg/dL — AB (ref 0.44–1.00)
Calcium: 7.8 mg/dL — ABNORMAL LOW (ref 8.9–10.3)
Chloride: 111 mmol/L (ref 101–111)
GFR calc Af Amer: 16 mL/min — ABNORMAL LOW (ref >60)
GFR calc non Af Amer: 14 mL/min — ABNORMAL LOW (ref >60)
GLUCOSE: 136 mg/dL — AB (ref 65–99)
POTASSIUM: 4.1 mmol/L (ref 3.5–5.1)
Sodium: 139 mmol/L (ref 135–145)

## 2015-02-05 LAB — GLUCOSE, CAPILLARY
GLUCOSE-CAPILLARY: 118 mg/dL — AB (ref 65–99)
GLUCOSE-CAPILLARY: 134 mg/dL — AB (ref 65–99)
Glucose-Capillary: 109 mg/dL — ABNORMAL HIGH (ref 65–99)
Glucose-Capillary: 119 mg/dL — ABNORMAL HIGH (ref 65–99)
Glucose-Capillary: 162 mg/dL — ABNORMAL HIGH (ref 65–99)

## 2015-02-05 LAB — HEPARIN LEVEL (UNFRACTIONATED): Heparin Unfractionated: 0.31 IU/mL (ref 0.30–0.70)

## 2015-02-05 MED ORDER — SENNA 8.6 MG PO TABS
1.0000 | ORAL_TABLET | Freq: Every day | ORAL | Status: DC
Start: 2015-02-05 — End: 2015-02-07
  Administered 2015-02-05 – 2015-02-06 (×2): 8.6 mg via ORAL
  Filled 2015-02-05 (×3): qty 1

## 2015-02-05 MED ORDER — NALOXONE HCL 0.4 MG/ML IJ SOLN: 0.4000 mg | INTRAMUSCULAR | Status: DC | PRN

## 2015-02-05 MED ORDER — DORZOLAMIDE HCL 2 % OP SOLN
1.0000 [drp] | Freq: Three times a day (TID) | OPHTHALMIC | Status: DC
  Administered 2015-02-06 – 2015-02-12 (×19): 1 [drp] via OPHTHALMIC
  Filled 2015-02-05: qty 10

## 2015-02-05 MED ORDER — LATANOPROST 0.005 % OP SOLN: 1.0000 [drp] | Freq: Every day | OPHTHALMIC | Status: DC

## 2015-02-05 MED ORDER — BRIMONIDINE TARTRATE 0.2 % OP SOLN
1.0000 [drp] | Freq: Three times a day (TID) | OPHTHALMIC | Status: DC
  Administered 2015-02-05: 1 [drp] via OPHTHALMIC
  Filled 2015-02-05: qty 5

## 2015-02-05 MED ORDER — BRIMONIDINE TARTRATE 0.2 % OP SOLN
1.0000 [drp] | Freq: Three times a day (TID) | OPHTHALMIC | Status: DC
  Administered 2015-02-06 – 2015-02-12 (×19): 1 [drp] via OPHTHALMIC
  Filled 2015-02-05: qty 5

## 2015-02-05 MED ORDER — DORZOLAMIDE HCL 2 % OP SOLN: 1.0000 [drp] | Freq: Three times a day (TID) | OPHTHALMIC | Status: DC

## 2015-02-05 MED ORDER — LATANOPROST 0.005 % OP SOLN
1.0000 [drp] | Freq: Every day | OPHTHALMIC | Status: DC
Start: 2015-02-05 — End: 2015-02-05
  Administered 2015-02-05: 1 [drp] via OPHTHALMIC
  Filled 2015-02-05: qty 2.5

## 2015-02-05 MED ORDER — LATANOPROST 0.005 % OP SOLN
1.0000 [drp] | Freq: Every day | OPHTHALMIC | Status: DC
  Administered 2015-02-06 – 2015-02-11 (×6): 1 [drp] via OPHTHALMIC
  Filled 2015-02-05: qty 2.5

## 2015-02-05 MED ORDER — VANCOMYCIN HCL IN DEXTROSE 1-5 GM/200ML-% IV SOLN
1000.0000 mg | INTRAVENOUS | Status: DC
  Filled 2015-02-05: qty 200

## 2015-02-05 MED ORDER — SODIUM CHLORIDE 0.9 % IJ SOLN: 9.0000 mL | INTRAMUSCULAR | Status: DC | PRN

## 2015-02-05 MED ORDER — HYDROMORPHONE 0.3 MG/ML IV SOLN
INTRAVENOUS | Status: DC
  Administered 2015-02-05: 1.4 mg via INTRAVENOUS
  Administered 2015-02-05: 13:00:00 via INTRAVENOUS
  Administered 2015-02-05: 2.1 mg via INTRAVENOUS
  Administered 2015-02-06: 0.3 mg via INTRAVENOUS
  Administered 2015-02-06: 1.5 mg via INTRAVENOUS
  Administered 2015-02-06: 1.8 mg via INTRAVENOUS
  Administered 2015-02-06: 2.7 mg via INTRAVENOUS
  Filled 2015-02-05 (×2): qty 25

## 2015-02-05 NOTE — Consult Note (Signed)
Reason for Consult: Left foot gangrene with ischemic right great toe Referring Physician: Dr. Deitra Mayo  April Kennedy is an 81 y.o. female.  HPI: Patient is a 81 year old woman with severe peripheral vascular disease who is status post revascularization for the left lower extremity with persistent ischemic changes to the left hindfoot and forefoot.  Past Medical History  Diagnosis Date  . Hypertension   . Glaucoma   . Chronic kidney disease   . Neuromuscular disorder   . Diabetes mellitus without complication   . Asthma   . Hyperlipidemia   . Metastatic adenocarcinoma 01/2015     suspected lung primary     Past Surgical History  Procedure Laterality Date  . Chest tube insertion Right 01/26/2015    Procedure: INSERTION RIGHT PLEURAL DRAINAGE CATHETER;  Surgeon: Ivin Poot, MD;  Location: [facility];  Service: Thoracic;  Laterality: Right;  . Peripheral vascular catheterization N/A 01/17/2015    Procedure: Abdominal Aortogram;  Surgeon: Angelia Mould, MD;  Location: [facility];  Service: Cardiovascular;  Laterality: N/A;  . Endarterectomy femoral Left 01/18/2015    Procedure: ENDARTERECTOMY FEMORAL-LEFT;  Surgeon: Angelia Mould, MD;  Location: [facility];  Service: Vascular;  Laterality: Left;  . Embolectomy Left 02/05/2015    Procedure: LEFT TIBIAL EMBOLECTOMY;  Surgeon: Angelia Mould, MD;  Location: [facility];  Service: Vascular;  Laterality: Left;  . Patch angioplasty Left 02/15/2015    Procedure: PATCH ANGIOPLASTY FEMORAL ARTERY USING XENOSURE BIOLOGIC PATCH 1cm x 6cm;  Surgeon: Angelia Mould, MD;  Location: [facility];  Service: Vascular;  Laterality: Left;    Family History  Problem Relation Age of Onset  . Hypertension      Social History:  reports that she has quit smoking. She does not have any smokeless tobacco history on file. She reports that she does not drink alcohol or use illicit drugs.  Allergies:  Allergies  Allergen  Reactions  . Sulfa Antibiotics     Hives/ itching     Medications: I have reviewed the patient's current medications.  Results for orders placed or performed during the hospital encounter of 01/20/2015 (from the past 48 hour(s))  Glucose, capillary     Status: Abnormal   Collection Time: 01/19/2015  8:02 PM  Result Value Ref Range   Glucose-Capillary 109 (H) 65 - 99 mg/dL  Glucose, capillary     Status: Abnormal   Collection Time: 01/27/2015 11:51 PM  Result Value Ref Range   Glucose-Capillary 107 (H) 65 - 99 mg/dL   Comment 1 Notify RN   Basic metabolic panel     Status: Abnormal   Collection Time: 02/04/15  2:25 AM  Result Value Ref Range   Sodium 136 135 - 145 mmol/L   Potassium 4.4 3.5 - 5.1 mmol/L   Chloride 107 101 - 111 mmol/L   CO2 17 (L) 22 - 32 mmol/L   Glucose, Bld 126 (H) 65 - 99 mg/dL   BUN 30 (H) 6 - 20 mg/dL   Creatinine, Ser 2.85 (H) 0.44 - 1.00 mg/dL   Calcium 7.5 (L) 8.9 - 10.3 mg/dL   GFR calc non Af Amer 15 (L) >60 mL/min   GFR calc Af Amer 17 (L) >60 mL/min    Comment: (NOTE) The eGFR has been calculated using the CKD EPI equation. This calculation has not been validated in all clinical situations. eGFR's persistently <60 mL/min signify possible Chronic Kidney Disease.    Anion gap 12 5 -  15  CBC     Status: Abnormal   Collection Time: 02/04/15  2:25 AM  Result Value Ref Range   WBC 18.6 (H) 4.0 - 10.5 K/uL   RBC 3.46 (L) 3.87 - 5.11 MIL/uL   Hemoglobin 9.2 (L) 12.0 - 15.0 g/dL   HCT 28.1 (L) 36.0 - 46.0 %   MCV 81.2 78.0 - 100.0 fL   MCH 26.6 26.0 - 34.0 pg   MCHC 32.7 30.0 - 36.0 g/dL   RDW 14.8 11.5 - 15.5 %   Platelets 165 150 - 400 K/uL  Heparin level (unfractionated)     Status: Abnormal   Collection Time: 02/04/15  2:25 AM  Result Value Ref Range   Heparin Unfractionated <0.10 (L) 0.30 - 0.70 IU/mL    Comment:        IF HEPARIN RESULTS ARE BELOW EXPECTED VALUES, AND PATIENT DOSAGE HAS BEEN CONFIRMED, SUGGEST FOLLOW UP TESTING OF  ANTITHROMBIN III LEVELS.   Magnesium     Status: None   Collection Time: 02/04/15  2:25 AM  Result Value Ref Range   Magnesium 2.1 1.7 - 2.4 mg/dL  Phosphorus     Status: Abnormal   Collection Time: 02/04/15  2:25 AM  Result Value Ref Range   Phosphorus 6.6 (H) 2.5 - 4.6 mg/dL  Glucose, capillary     Status: Abnormal   Collection Time: 02/04/15  4:11 AM  Result Value Ref Range   Glucose-Capillary 126 (H) 65 - 99 mg/dL   Comment 1 Notify RN   Glucose, capillary     Status: Abnormal   Collection Time: 02/04/15  7:57 AM  Result Value Ref Range   Glucose-Capillary 113 (H) 65 - 99 mg/dL  Glucose, capillary     Status: Abnormal   Collection Time: 02/04/15 11:20 AM  Result Value Ref Range   Glucose-Capillary 114 (H) 65 - 99 mg/dL   Comment 1 Notify RN    Comment 2 Document in Chart   Heparin level (unfractionated)     Status: None   Collection Time: 02/04/15  2:08 PM  Result Value Ref Range   Heparin Unfractionated 0.31 0.30 - 0.70 IU/mL    Comment:        IF HEPARIN RESULTS ARE BELOW EXPECTED VALUES, AND PATIENT DOSAGE HAS BEEN CONFIRMED, SUGGEST FOLLOW UP TESTING OF ANTITHROMBIN III LEVELS.   Glucose, capillary     Status: Abnormal   Collection Time: 02/04/15  3:59 PM  Result Value Ref Range   Glucose-Capillary 124 (H) 65 - 99 mg/dL  Glucose, capillary     Status: Abnormal   Collection Time: 02/04/15  7:27 PM  Result Value Ref Range   Glucose-Capillary 125 (H) 65 - 99 mg/dL  Glucose, capillary     Status: Abnormal   Collection Time: 02/05/15 12:29 AM  Result Value Ref Range   Glucose-Capillary 134 (H) 65 - 99 mg/dL  Basic metabolic panel     Status: Abnormal   Collection Time: 02/05/15  2:21 AM  Result Value Ref Range   Sodium 139 135 - 145 mmol/L   Potassium 4.1 3.5 - 5.1 mmol/L   Chloride 111 101 - 111 mmol/L   CO2 18 (L) 22 - 32 mmol/L   Glucose, Bld 136 (H) 65 - 99 mg/dL   BUN 35 (H) 6 - 20 mg/dL   Creatinine, Ser 2.96 (H) 0.44 - 1.00 mg/dL   Calcium 7.8 (L)  8.9 - 10.3 mg/dL   GFR calc non Af Amer 14 (L) >60  mL/min   GFR calc Af Amer 16 (L) >60 mL/min    Comment: (NOTE) The eGFR has been calculated using the CKD EPI equation. This calculation has not been validated in all clinical situations. eGFR's persistently <60 mL/min signify possible Chronic Kidney Disease.    Anion gap 10 5 - 15  CBC     Status: Abnormal   Collection Time: 02/05/15  2:21 AM  Result Value Ref Range   WBC 20.1 (H) 4.0 - 10.5 K/uL   RBC 3.13 (L) 3.87 - 5.11 MIL/uL   Hemoglobin 8.2 (L) 12.0 - 15.0 g/dL   HCT 24.8 (L) 36.0 - 46.0 %   MCV 79.2 78.0 - 100.0 fL   MCH 26.2 26.0 - 34.0 pg   MCHC 33.1 30.0 - 36.0 g/dL   RDW 14.5 11.5 - 15.5 %   Platelets 215 150 - 400 K/uL  Glucose, capillary     Status: Abnormal   Collection Time: 02/05/15  3:40 AM  Result Value Ref Range   Glucose-Capillary 109 (H) 65 - 99 mg/dL  Glucose, capillary     Status: Abnormal   Collection Time: 02/05/15  7:22 AM  Result Value Ref Range   Glucose-Capillary 118 (H) 65 - 99 mg/dL  Heparin level (unfractionated)     Status: None   Collection Time: 02/05/15  7:30 AM  Result Value Ref Range   Heparin Unfractionated 0.31 0.30 - 0.70 IU/mL    Comment:        IF HEPARIN RESULTS ARE BELOW EXPECTED VALUES, AND PATIENT DOSAGE HAS BEEN CONFIRMED, SUGGEST FOLLOW UP TESTING OF ANTITHROMBIN III LEVELS.   Glucose, capillary     Status: Abnormal   Collection Time: 02/05/15  4:09 PM  Result Value Ref Range   Glucose-Capillary 119 (H) 65 - 99 mg/dL    Dg Chest Port 1 View  02/05/2015   CLINICAL DATA:  Shortness of breath  EXAM: PORTABLE CHEST - 1 VIEW  COMPARISON:  02/04/2015  FINDINGS: Cardiac shadow is prominent but stable from the prior exam. Persistent right pleural effusion and patchy infiltrates are again seen on the right. Prominent soft tissue along the right peritracheal region is noted as well. PleurX catheter is again identified and stable. The left lung remains clear.  IMPRESSION: Stable  changes in the right lung.  No new focal abnormality is seen.   Electronically Signed   By: Inez Catalina M.D.   On: 02/05/2015 07:40   Dg Chest Port 1 View  02/04/2015   CLINICAL DATA:  Respiratory failure, intubated patient metastatic malignancy  EXAM: PORTABLE CHEST - 1 VIEW  COMPARISON:  Portable chest x-ray of February 03, 2015  FINDINGS: The left lung is well-expanded. There is better visualization of the left hemidiaphragm today. On the right there is persistent volume loss. Persistent pleural fluid or pleural thickening lies at the right lung base and in the apex. The interstitial markings of the right lung remain increased. The cardiac silhouette is enlarged. The pulmonary vascularity is not engorged. The endotracheal tube tip lies 3.2 cm above the carina. The esophagogastric tube projects below the inferior margin of the image. There is no pneumothorax. The bony thorax exhibits no acute abnormality. There are degenerative changes of the left shoulder.  IMPRESSION: 1. Persistent volume loss, interstitial edema, and loculated pleural effusions on the right. 2. Slight interval improvement in the appearance of the left lung base with faint visualization of the hemidiaphragm. 3. Mild stable cardiomegaly.   Electronically Signed   By:  David  Martinique M.D.   On: 02/04/2015 07:14   Dg Abd Portable 1v  01/18/2015   CLINICAL DATA:  Metastatic adenocarcinoma, suspected lung primary, orogastric tube placement  EXAM: PORTABLE ABDOMEN - 1 VIEW  COMPARISON:  PET-CT 02/14/2015 and earlier studies  FINDINGS: Nasogastric tube extends just beyond the GE junction. The stomach is decompressed. Multiple gas distended small bowel loops in the mid abdomen. Loculated lateral right pleural effusion with PleurX catheter placed medially.  IMPRESSION: 1. Nasogastric tube into decompressed stomach. 2. Loculated right pleural effusion.   Electronically Signed   By: Lucrezia Europe M.D.   On: 02/01/2015 21:31    Review of Systems  All  other systems reviewed and are negative.  Blood pressure 144/70, pulse 115, temperature 98.7 F (37.1 C), temperature source Oral, resp. rate 24, height _0  (1.626 m), weight 87.5 kg (192 lb 14.4 oz), SpO2 99 %. Physical Exam On examination patient's left forefoot is ischemic and gangrenous and color. She has decreased turgor of the hindfoot and the hindfoot is extremely tender to palpation. She is tender to palpation halfway up her calf. Her surgical incisions are well approximated. Assessment/Plan: Assessment: Left foot gangrenous changes including the hindfoot.  Plan: Discussed with the patient and her family that her best option is a transtibial amputation. This may require a higher level amputation but it would be beneficial to try a transtibial amputation for the patient to remain mobile. With an above-the-knee amputation she would be nonambulatory. Patient has a significant pulmonary issues at this time she has difficulty with breathing at rest. Discussed with the family that once she is medically stable she would then be safe for transtibial amputation. They will contact me if they are ready to proceed with surgery.  DUDA,MARCUS V 02/05/2015, 6:00 PM

## 2015-02-05 NOTE — Progress Notes (Signed)
   VASCULAR SURGERY ASSESSMENT & PLAN:  * 2 Days Post-Op s/p:  1. Endarterectomy of left external iliac artery and common femoral artery with vein patch angioplasty (right GSV) 2. Embolectomy of anterior tibial artery, posterior tibial artery, and peroneal arteries. 3. Vein patch angioplasty of tibial perineal trunk and posterior tibial artery. (right GSV) 4. Endarterectomy of left superficial femoral artery with patch angioplasty (bovine pericardial patch) 5. Endarterectomy of the proximal left posterior tibial artery.  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 I do not think that repeat tibial thrombectomy has any chance of success. I have explained to her that I think that she will need left BKA or AKA. She needs to think about this. Dr. Sharol Given had seen the patient and I have spoken to him. He could potentially do surgery Friday if she is agreeable.   SUBJECTIVE: C/O left foot pain.  PHYSICAL EXAM: Filed Vitals:   02/05/15 0341 02/05/15 0400 02/05/15 0500 02/05/15 0600  BP:  138/51  127/60  Pulse:  103 105 105  Temp: 97.5 F (36.4 C)     TempSrc: Oral     Resp:  '22 30 14  '$ Height:      Weight:      SpO2:  93% 95% 96%   Groin JP 75 cc last shift. Nothing out of JP # 2  Left forefoot mottled.  Doppler flow to proximal lower leg, then no doppler flow in foot.   LABS: Lab Results  Component Value Date   WBC 20.1* 02/05/2015   HGB 8.2* 02/05/2015   HCT 24.8* 02/05/2015   MCV 79.2 02/05/2015   PLT 215 02/05/2015   Lab Results  Component Value Date   CREATININE 2.96* 02/05/2015   Lab Results  Component Value Date   INR 1.18 01/26/2015   CBG (last 3)   Recent Labs  02/04/15 1927 02/05/15 0029 02/05/15 0340  GLUCAP 125* 134* 109*   Active Problems:   Pleural effusion, right   Essential hypertension   Chronic kidney disease, stage 3   Left leg cellulitis  Cellulitis   Foot pain, left   Malignant pleural effusion  Gae Gallop Beeper: [phone] 02/05/2015

## 2015-02-05 NOTE — Progress Notes (Signed)
Daily Progress Note   Patient Name: April Kennedy       Date: 02/05/2015 DOB: 21-Dec-1933  Age: 81 y.o. MRN#: [id number] Attending Physician: Chesley Mires, MD Primary Care Physician: Kevan Ny, MD Admit Date: 02/01/2015  Reason for Consultation/Follow-up: Establishing goals of care, Non pain symptom management, Pain control and Psychosocial/spiritual support  Subjective:     -meet with patient and her  large family and community church pastoral care at bedside to discuss diagnoses, prognosis,  GOC,  and options.  -Concept of Palliative Care was discussed  A detailed discussion was had today regarding advanced directives.  Concepts specific to code status, artifical feeding and hydration, continued IV antibiotics and rehospitalization was had.  The difference between a aggressive medical intervention path  and a palliative comfort care path for this patient at this time was had.  Values and goals of care important to patient and family were attempted to be elicited.  This has been extremely difficult for this family "everything happening so fast".  Today their main concern to a second opinion regarding need for amputation.  I spoke with Dr Scot Dock and he instructed me to notify Dr Bridgett Larsson   who is on call tonight, he was notified of request for second opinion.  At that time patient will make decision regarding recommended amputation.  I have coordinated with Dr Lindi Adie for him to visit with family today at 1615, family is grateful for his input in the overall picture, specifically as it relates to her cancer diagnosis.    I addressed questions and concerns to the best of my ablity.   Family encouraged to call with questions or concerns.  PMT will continue to support holistically.     Length of Stay: 6 days  Current Medications: Scheduled Meds:  . antiseptic oral rinse  7 mL Mouth Rinse BID  . aspirin EC  81 mg Oral Daily  . atorvastatin  20 mg Oral q1800  . brimonidine  1 drop Both  Eyes TID  . dorzolamide  1 drop Right Eye TID  . HYDROmorphone PCA 0.3 mg/mL   Intravenous 6 times per day  . insulin aspart  0-15 Units Subcutaneous 6 times per day  . latanoprost  1 drop Both Eyes QHS  . metoprolol tartrate  25 mg Oral BID  . pantoprazole  40 mg Oral QHS  . piperacillin-tazobactam (ZOSYN)  IV  2.25 g Intravenous 3 times per day  . sodium chloride  3 mL Intravenous Q12H  . [START ON 02/06/2015] vancomycin  1,000 mg Intravenous Q48H    Continuous Infusions: . heparin 1,200 Units/hr (02/05/15 1300)    PRN Meds: [DISCONTINUED] acetaminophen **OR** acetaminophen, acetaminophen, albuterol, hydrALAZINE, naloxone **AND** sodium chloride, ondansetron **OR** [DISCONTINUED] ondansetron (ZOFRAN) IV  Palliative Performance Scale: 30 % at best     Vital Signs: BP 99/77 mmHg  Pulse 103  Temp(Src) 97.8 F (36.6 C) (Oral)  Resp 26  Ht '5\' 4"'$  (1.626 m)  Wt 87.5 kg (192 lb 14.4 oz)  BMI 33.10 kg/m2  SpO2 96% SpO2: SpO2: 96 % O2 Device: O2 Device: Nasal Cannula O2 Flow Rate: O2 Flow Rate (L/min): 4 L/min  Intake/output summary:  Intake/Output Summary (Last 24 hours) at 02/05/15 1458 Last data filed at 02/05/15 1300  Gross per 24 hour  Intake 2039.85 ml  Output    850 ml  Net 1189.85 ml   LBM: Last BM Date: 02/01/15 Baseline Weight: Weight: 84.732 kg (186 lb 12.8 oz) Most recent weight: Weight:  87.5 kg (192 lb 14.4 oz)  Physical Exam:          General: ill appearing, NAD HEENT: moist buccal membranes  HER:[id number] Resp: CTA Neuro: alert and oriented X3   Additional Data Reviewed: Recent Labs     02/04/15  0225  02/05/15  0221  WBC  18.6*  20.1*  HGB  9.2*  8.2*  PLT  165  215  NA  136  139  BUN  30*  35*  CREATININE  2.85*  2.96*     Problem List:  Patient Active Problem List   Diagnosis Date Noted  . Palliative care encounter 02/05/2015  . DNR (do not resuscitate) discussion 02/05/2015  . Left leg cellulitis 02/15/2015  . Cellulitis  01/23/2015  . Foot pain, left 01/22/2015  . Malignant pleural effusion 01/19/2015  . Enlarged lymph nodes 01/19/2015  . Essential hypertension 01/18/2015  . Acute renal failure 01/18/2015  . Chronic kidney disease, stage 3 01/18/2015  . Pleural effusion, right 01/16/2015  . Elevated troponin 01/16/2015  . Glaucoma 08/24/2012     Palliative Care Assessment & Plan    Code Status:  Full code  Goals of Care:  -At this time patient and her family are hopeful and "pray for recovery, we are a strong faith family".  She is open to all offered and available medical interventions to prolong life.   They hope that need for amputation can be avoided, however patient understands the decisions facing her.   3. Symptom Management:    LLE Pain- switched to Dilaudid PCA today and patient is tolerating  well with better pain control   4. Palliative Prophylaxis:   Constipation:  Senna-s -one tablet po qhs   Care plan was discussed with Dr Scot Dock, Dr Lindi Adie  Thank you for allowing the Palliative Medicine Team to assist in the care of this patient.   Time In: 1500 Time Out: 1620 Total Time 80 minutes Prolonged Time Billed  no     Greater than 50%  of this time was spent counseling and coordinating care related to the above assessment and plan.   Knox Royalty, NP  02/05/2015, 2:58 PM  Please contact Palliative Medicine Team phone at [phone] for questions and concerns.

## 2015-02-05 NOTE — Progress Notes (Signed)
Wasted 35 cc of fentanyl PCA in sink with Ines Bloomer, RN.

## 2015-02-05 NOTE — Progress Notes (Signed)
ANTICOAGULATION & ANTIBIOTIC CONSULT NOTE - Follow Up Consult  Pharmacy Consult for Heparin & Vancomycin + Zosyn Indication: Ischemic left lower extremity  Allergies  Allergen Reactions  . Sulfa Antibiotics     Hives/ itching     Patient Measurements: Height: '5\' 4"'$  (162.6 cm) Weight: 192 lb 14.4 oz (87.5 kg) IBW/kg (Calculated) : 54.7 Heparin Dosing Weight: 74 kg  Vital Signs: Temp: 97.5 F (36.4 C) (07/21 0341) Temp Source: Oral (07/21 0341) BP: 127/60 mmHg (07/21 0600) Pulse Rate: 105 (07/21 0600)  Labs:  Recent Labs  01/18/2015 0132 01/24/2015 1717 02/04/15 0225 02/04/15 1408 02/05/15 0221  HGB 11.3* 9.6* 9.2*  --  8.2*  HCT 33.7* 29.5* 28.1*  --  24.8*  PLT 157 150 165  --  215  HEPARINUNFRC 0.20*  --  <0.10* 0.31  --   CREATININE 2.09* 2.67* 2.85*  --  2.96*    Estimated Creatinine Clearance: 16.8 mL/min (by C-G formula based on Cr of 2.96).   Medications:  Heparin @ 1200 units/hr  Assessment: 75 YOF who continues on heparin for a ischemic LLE ss/p OR 7/19 with endarterectomy + embolectomy. Per [facility]VS: No doppler flow to LLE - likely reclotted tibials despite heparin - could require amputation if L leg continues to progress. Consulting ortho for possible amputation on 7/22 if agreeable. HL 0.31, Hgb 8.2 << 9.2, plts wnl  The patient also continues on Vancomycin + Zosyn for empiric infectious coverage. Afebrile, WBC 20.1 << 18.6 (no steroids), SCr 2.96, CrCl~15-20 ml/min, UOP/24h: 0.5 ml/kg/hr. Will adjust Vancomycin dose today, Zosyn remains appropriate.   Goal of Therapy:  Heparin level 0.3-0.7 units/ml Monitor platelets by anticoagulation protocol: Yes  Vancomycin trough of 10-15 mcg/ml   Plan:  1. Continue Heparin at the current rate of 1200 units/hr (12 ml/hr) 2. Reduce Vancomycin to 1g IV every 48 hours 3. Continue Zosyn 2.25g IV every 8 hours 4. Will continue to follow renal function, culture results, LOT, and antibiotic de-escalation plans    Alycia Rossetti, PharmD, BCPS Clinical Pharmacist Pager: [phone] 02/05/2015 11:15 AM

## 2015-02-05 NOTE — Consult Note (Signed)
PULMONARY / CRITICAL CARE MEDICINE   Name: April Kennedy MRN: [id number] DOB: 11/03/1933    ADMISSION DATE:  02/13/2015 CONSULTATION DATE:  01/19/2015  REFERRING MD :  Dr. 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  Work up concerning for left > right foot ischemia.  Patient was taken to the OR on 7/19 for L endarterectomy and was unable to extubate post-op.  Returned to ICU for monitoring   STUDIES:  7/15  Venous Duplex >> no DVT in RLE    SIGNIFICANT EVENTS: 7/11  R PleurX placed per Dr. Prescott Gum for metastatic effusion  7/15  PET >> small right pleural effusion (pleurX cath in place, placed on 7/11 per Dr. Prescott Gum, PET scan done showed hypermetabolic pleural thickening of the R hemothorax, metastatic spread to lung, mediastinum and L supraclavicular node. 7/15  MRI Brain >> normal appearance of brain 7/15  Admit with L toe pain, swelling.  Concerns for BLE ischemia  7/19  To OR for L endarterectomy 7/20 Extubation  SUBJECTIVE:  Patient in a significant amount of pain this AM. Pain localized primarily to her Lt leg/foot. Patient on PCA, encouraged to use this as frequently as necessary.  VITAL SIGNS: Temp:  [97.4 F (36.3 C)-98.4 F (36.9 C)] 97.5 F (36.4 C) (07/21 0816) Pulse Rate:  [92-124] 99 (07/21 1100) Resp:  [14-33] 20 (07/21 1102) BP: (84-144)/(44-96) 122/44 mmHg (07/21 1100) SpO2:  [89 %-98 %] 98 % (07/21 1102)   INTAKE / OUTPUT:  Intake/Output Summary (Last 24 hours) at 02/05/15 1133 Last data filed at 02/05/15 1000  Gross per 24 hour  Intake 1998.1 ml  Output    995 ml  Net 1003.1 ml    PHYSICAL EXAMINATION: General:  Elderly female in obvious pain but NAD, speaking clearly and answering questions appropriately Neuro:  Alert, oriented, no focal deficits  appreciated.  HEENT:  MM pink/moist, pupils small (appropriately) but equal and reactive to light Cardiovascular:  RRR, no murmur Lungs: lungs bilaterally coarse, diminished RLL Abdomen:  Obese/soft, +BS Musculoskeletal:  No boney deformities.  Skin:  no edema, LLE cool to touch w/ all 5 toes darkened coloration, RLE w/ +2 pitting edema (but warm) and Rt great toe w/ darkened discoloration to skin, surgical incision in L femoral area and L medial calf - clean/dry, JP drain x2 with sanguinous drainage   LABS:  CBC  Recent Labs Lab 01/19/2015 1717 02/04/15 0225 02/05/15 0221  WBC 19.3* 18.6* 20.1*  HGB 9.6* 9.2* 8.2*  HCT 29.5* 28.1* 24.8*  PLT 150 165 215   Coag's No results for input(s): APTT, INR in the last 168 hours.   BMET  Recent Labs Lab 02/06/2015 1717 02/04/15 0225 02/05/15 0221  NA 137 136 139  K 4.2 4.4 4.1  CL 107 107 111  CO2 18* 17* 18*  BUN 28* 30* 35*  CREATININE 2.67* 2.85* 2.96*  GLUCOSE 130* 126* 136*   Electrolytes  Recent Labs Lab 01/31/15 0525  01/29/2015 1717 02/04/15 0225 02/05/15 0221  CALCIUM 8.3*  < > 7.6* 7.5* 7.8*  MG 2.1  --   --  2.1  --   PHOS 3.7  --   --  6.6*  --   < > = values in this interval not displayed. Sepsis Markers  Recent Labs Lab  01/17/2015 2033 01/31/15 0525 02/06/2015 1714  LATICACIDVEN 1.71 1.1 1.6   ABG  Recent Labs Lab 02/10/2015 1515  PHART 7.260*  PCO2ART 38.4  PO2ART 70.0*     Liver Enzymes  Recent Labs Lab 01/20/2015 1700 01/31/15 0525  AST 33 28  ALT 33 31  ALKPHOS 59 53  BILITOT 0.7 0.6  ALBUMIN 2.9* 2.5*   Cardiac Enzymes No results for input(s): TROPONINI, PROBNP in the last 168 hours. Glucose  Recent Labs Lab 02/04/15 1120 02/04/15 1559 02/04/15 1927 02/05/15 0029 02/05/15 0340 02/05/15 0722  GLUCAP 114* 124* 125* 134* 109* 118*    Imaging Dg Chest Port 1 View  02/05/2015   CLINICAL DATA:  Shortness of breath  EXAM: PORTABLE CHEST - 1 VIEW  COMPARISON:  02/04/2015   FINDINGS: Cardiac shadow is prominent but stable from the prior exam. Persistent right pleural effusion and patchy infiltrates are again seen on the right. Prominent soft tissue along the right peritracheal region is noted as well. PleurX catheter is again identified and stable. The left lung remains clear.  IMPRESSION: Stable changes in the right lung.  No new focal abnormality is seen.   Electronically Signed   By: Inez Catalina M.D.   On: 02/05/2015 07:40     ASSESSMENT / PLAN:  PULMONARY OETT 7/19 >> 7/20 A: Acute Respiratory Failure - unable to extubate post-op in PACU Former Smoker Adenocarcinoma - confirmed in R pleural fluid on thoracentesis, s/p PleurX per Dr. Prescott Gum 7/11 P:   Extubated 7/20, tolerating [place] well CXR 7/21: no acute changes from previous PRN albuterol given smoking history  Follow up with ONC post discharge Monitor PleurX site, typically drains from site Q2days  CARDIOVASCULAR CVL A:  HTN HLD P:  Continue lopressor, ASA, lipitor  ICU monitoring   RENAL A:   Acute on Chronic Renal Failure - baseline CKD IV (BL Cr ~1.0) Metabolic Acidosis - consider renal wasting 2/2 AoCKD P:   Trend BMP / UOP   - UOP only ~1L last 24hrs Replace electrolytes as indicated  Lactic acid wnl NS at 180m/hr F/u CK  GASTROINTESTINAL A:   Vent Associated Dysphagia  P:   PPI for SUP  Clear Liquid diet; advance as tol -- Make NPO after MN if surgery is scheduled  HEMATOLOGIC A:   Mild Anemia  LE Ischemia -- worsening LLE Leukocytosis P:  Trend CBC  Heparin gtt per pharmacy  NO SCD's  Vascular checks  JP drains per VVS  INFECTIOUS A:   No acute infectious process  P:   Vanco 7/15 >> Zosyn 7/15 >>  ENDOCRINE A:  DM   P:   CBG Q4 with SSI  Hold home regimen   NEUROLOGIC A:   Post-Operative Pain  Ischemic Pain P:   RASS goal: 0 DC fentanyl PCA Start Dilaudid PCA Palliative Care consulted -- appreciate the recs.   FAMILY  - Updates:  Daughter updated at bedside. Family meeting scheduled for 3pm today (7/21)    IElberta Leatherwood MD,MS,  PGY2 02/05/2015 11:33 AM  Respiratory status stable.  She had pleural fluid drained this AM.  Still having significant pain in lt leg.  Alert.  No wheeze.  Lt lower leg/foot cool and painful to touch  Rt 1st toe ischemic changes.  Will change to dilaudid PCA.  Had extensive discussion with pt and family about her current health status >> likely needing Lt lower extremity amputation, but whether this is reasonable to do in setting of Stage  IV NSCLC.  They have meeting with palliative care this afternoon.  Depending upon outcome from this meeting, will then decide about further tx options.  Chesley Mires, MD [facility] Pulmonary/Critical Care 02/05/2015, 3:12 PM Pager:  [phone] After 3pm call: [phone]

## 2015-02-05 NOTE — Progress Notes (Signed)
   Daily Progress Note  Assessment/Planning: POD #2 s/p L iliofemoral EA w/ VPA, Embolectomy L tibial arteries, VPA L TPT and PT, EA L SFA and PT 1. Lung CA 2. BLE PAD 3. HTN,  4. DM,  5. HLD   Physical exam is c/w with successful surgery with persistence of tarsal and digital artery occlusion.  Unfortunately there is no easy way to surgical manage tarsal and digital artery occlusion.  Usually extended period of thrombolysis is necessary to address such.  Recent surgery is an ABSOLUTE CONTRAINDICATION to thrombolysis due to likelihood of bleeding complications.  Additionally, the L foot is already in the process of dying so the foot is not salvageable.  The traditionally teaching has been to avoid revascularizing dead limb so avoid potential SIRS and renal failure from cytokine and myoglobin release.  I agree that L BKA vs AKA is likely the best palliative option.    Subjective  - 2 Days Post-Op  L foot painful  Objective Filed Vitals:   02/05/15 1400 02/05/15 1500 02/05/15 1608 02/05/15 1614  BP: 113/49 144/70    Pulse: 103 115    Temp:    98.7 F (37.1 C)  TempSrc:    Oral  Resp: '18 18 24   '$ Height:      Weight:      SpO2: 95% 99%      Intake/Output Summary (Last 24 hours) at 02/05/15 1719 Last data filed at 02/05/15 1500  Gross per 24 hour  Intake 2440.75 ml  Output    730 ml  Net 1710.75 ml   VASC  L groin incision c/d/i, no hematoma, L leg warm down to level of ankle, skin demarcating in forefoot and plantar surface, skin at these areas is cold and ashen; R great toe ischemic  Laboratory CBC    Component Value Date/Time   WBC 20.1* 02/05/2015 0221   WBC 4.2 06/28/2012   HGB 8.2* 02/05/2015 0221   HCT 24.8* 02/05/2015 0221   PLT 215 02/05/2015 0221    BMET    Component Value Date/Time   NA 139 02/05/2015 0221   NA 141 06/28/2012   K 4.1 02/05/2015 0221   CL 111 02/05/2015 0221   CO2 18* 02/05/2015 0221   GLUCOSE 136* 02/05/2015 0221   BUN 35*  02/05/2015 0221   BUN 24* 06/28/2012   CREATININE 2.96* 02/05/2015 0221   CREATININE 1.9* 06/28/2012   CALCIUM 7.8* 02/05/2015 0221   GFRNONAA 14* 02/05/2015 0221   GFRAA 16* 02/05/2015 0221    Adele Barthel, MD [facility]: [phone] Pager: [phone]  02/05/2015, 5:19 PM

## 2015-02-06 ENCOUNTER — Other Ambulatory Visit (HOSPITAL_COMMUNITY): Payer: Self-pay | Admitting: Orthopedic Surgery

## 2015-02-06 ENCOUNTER — Inpatient Hospital Stay (HOSPITAL_COMMUNITY): Payer: Medicare Other

## 2015-02-06 ENCOUNTER — Encounter (HOSPITAL_COMMUNITY): Payer: Self-pay

## 2015-02-06 LAB — GLUCOSE, CAPILLARY
GLUCOSE-CAPILLARY: 86 mg/dL (ref 65–99)
GLUCOSE-CAPILLARY: 100 mg/dL — AB (ref 65–99)
GLUCOSE-CAPILLARY: 83 mg/dL (ref 65–99)
Glucose-Capillary: 100 mg/dL — ABNORMAL HIGH (ref 65–99)
Glucose-Capillary: 100 mg/dL — ABNORMAL HIGH (ref 65–99)
Glucose-Capillary: 117 mg/dL — ABNORMAL HIGH (ref 65–99)
Glucose-Capillary: 124 mg/dL — ABNORMAL HIGH (ref 65–99)

## 2015-02-06 LAB — CBC
HEMATOCRIT: 23.9 % — AB (ref 36.0–46.0)
HEMOGLOBIN: 7.9 g/dL — AB (ref 12.0–15.0)
MCH: 26.2 pg (ref 26.0–34.0)
MCHC: 33.1 g/dL (ref 30.0–36.0)
MCV: 79.4 fL (ref 78.0–100.0)
Platelets: 246 10*3/uL (ref 150–400)
RBC: 3.01 MIL/uL — ABNORMAL LOW (ref 3.87–5.11)
RDW: 14.6 % (ref 11.5–15.5)
WBC: 15.4 10*3/uL — AB (ref 4.0–10.5)

## 2015-02-06 LAB — CK: Total CK: 1550 U/L — ABNORMAL HIGH (ref 38–234)

## 2015-02-06 LAB — BASIC METABOLIC PANEL
ANION GAP: 13 (ref 5–15)
BUN: 44 mg/dL — ABNORMAL HIGH (ref 6–20)
CO2: 18 mmol/L — AB (ref 22–32)
CREATININE: 3.33 mg/dL — AB (ref 0.44–1.00)
Calcium: 7.9 mg/dL — ABNORMAL LOW (ref 8.9–10.3)
Chloride: 107 mmol/L (ref 101–111)
GFR calc Af Amer: 14 mL/min — ABNORMAL LOW (ref >60)
GFR calc non Af Amer: 12 mL/min — ABNORMAL LOW (ref >60)
GLUCOSE: 102 mg/dL — AB (ref 65–99)
Potassium: 4.5 mmol/L (ref 3.5–5.1)
Sodium: 138 mmol/L (ref 135–145)

## 2015-02-06 LAB — HEPARIN LEVEL (UNFRACTIONATED): HEPARIN UNFRACTIONATED: 0.3 [IU]/mL (ref 0.30–0.70)

## 2015-02-06 MED ORDER — HYDROMORPHONE 0.3 MG/ML IV SOLN
INTRAVENOUS | Status: DC
  Administered 2015-02-06: 1.2 mg via INTRAVENOUS
  Administered 2015-02-07: 1.49 mg via INTRAVENOUS
  Administered 2015-02-07: 1 mg via INTRAVENOUS

## 2015-02-06 MED ORDER — HEPARIN (PORCINE) IN NACL 100-0.45 UNIT/ML-% IJ SOLN
1250.0000 [IU]/h | INTRAMUSCULAR | Status: DC
  Administered 2015-02-06: 1250 [IU]/h via INTRAVENOUS
  Filled 2015-02-06 (×3): qty 250

## 2015-02-06 MED ORDER — GUAIFENESIN ER 600 MG PO TB12
600.0000 mg | ORAL_TABLET | Freq: Two times a day (BID) | ORAL | Status: DC
  Administered 2015-02-06 (×2): 600 mg via ORAL
  Filled 2015-02-06 (×4): qty 1

## 2015-02-06 MED ORDER — SODIUM CHLORIDE 0.9 % IV BOLUS (SEPSIS)
500.0000 mL | Freq: Once | INTRAVENOUS | Status: AC
  Administered 2015-02-06: 500 mL via INTRAVENOUS

## 2015-02-06 MED ORDER — LIDOCAINE IN D5W 4-5 MG/ML-% IV SOLN
1.0000 mg/min | Freq: Once | INTRAVENOUS | Status: DC
  Filled 2015-02-06: qty 500

## 2015-02-06 MED ORDER — SODIUM CHLORIDE 0.9 % IV SOLN
INTRAVENOUS | Status: DC
  Administered 2015-02-06 (×2): via INTRAVENOUS
  Filled 2015-02-06 (×4): qty 1000

## 2015-02-06 MED ORDER — HYDROMORPHONE 0.3 MG/ML IV SOLN
INTRAVENOUS | Status: DC
  Administered 2015-02-06: 1.5 mg via INTRAVENOUS
  Administered 2015-02-06: 18:00:00 via INTRAVENOUS
  Filled 2015-02-06: qty 25

## 2015-02-06 MED ORDER — LORAZEPAM 1 MG PO TABS
1.0000 mg | ORAL_TABLET | Freq: Four times a day (QID) | ORAL | Status: DC | PRN
  Administered 2015-02-06: 1 mg via ORAL
  Filled 2015-02-06: qty 1

## 2015-02-06 NOTE — Consult Note (Signed)
PULMONARY / CRITICAL CARE MEDICINE   Name: April Kennedy MRN: [id number] DOB: 1933-08-27    ADMISSION DATE:  02/11/2015 CONSULTATION DATE:  01/26/2015  REFERRING MD :  Dr. 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  Work up concerning for left > right foot ischemia.  Patient was taken to the OR on 7/19 for L endarterectomy and was unable to extubate post-op.  Returned to ICU for monitoring   STUDIES:  7/15  Venous Duplex >> no DVT in RLE    SIGNIFICANT EVENTS: 7/11  R PleurX placed per Dr. Prescott Gum for metastatic effusion  7/15  PET >> small right pleural effusion (pleurX cath in place, placed on 7/11 per Dr. Prescott Gum, PET scan done showed hypermetabolic pleural thickening of the R hemothorax, metastatic spread to lung, mediastinum and L supraclavicular node. 7/15  MRI Brain >> normal appearance of brain 7/15  Admit with L toe pain, swelling.  Concerns for BLE ischemia  7/19  To OR for L endarterectomy 7/20 Extubation  SUBJECTIVE:  Patient continues to be in severe pain 2/2 to pulseless Lt leg. Pain better controlled on Dilaudid PCA. Was initially refractory to idea of amputation--has since changed her mind and has agreed to proceed w/ amputation.  VITAL SIGNS: Temp:  [97.6 F (36.4 C)-98.8 F (37.1 C)] 97.6 F (36.4 C) (07/22 0730) Pulse Rate:  [90-115] 96 (07/22 1100) Resp:  [11-24] 13 (07/22 1100) BP: (84-144)/(36-70) 84/36 mmHg (07/22 1100) SpO2:  [89 %-100 %] 97 % (07/22 1100)   INTAKE / OUTPUT:  Intake/Output Summary (Last 24 hours) at 02/06/15 1416 Last data filed at 02/06/15 1100  Gross per 24 hour  Intake 638.75 ml  Output    151 ml  Net 487.75 ml    PHYSICAL EXAMINATION: General:  Elderly, in obvious pain, NAD, answering questions appropriately Neuro:  Alert,  oriented, no focal deficits appreciated. Speaks clearly HEENT:  MMM, EOMI, no JVD, upper airway secretions audible Cardiovascular:  RRR, no murmur Lungs: lungs bilaterally coarse, diminished RLL, Rt sided tube in place Abdomen:  Obese/soft, +BS Musculoskeletal:  No boney deformities.  Skin: LLE cool to touch w/ all 5 toes darkened coloration, RLE w/ +2 pitting edema (but warm) and Rt great toe w/ darkened discoloration to skin, surgical incision in L femoral area and L medial calf - clean/dry  LABS:  CBC  Recent Labs Lab 02/04/15 0225 02/05/15 0221 02/06/15 0218  WBC 18.6* 20.1* 15.4*  HGB 9.2* 8.2* 7.9*  HCT 28.1* 24.8* 23.9*  PLT 165 215 246   Coag's No results for input(s): APTT, INR in the last 168 hours.   BMET  Recent Labs Lab 02/04/15 0225 02/05/15 0221 02/06/15 0218  NA 136 139 138  K 4.4 4.1 4.5  CL 107 111 107  CO2 17* 18* 18*  BUN 30* 35* 44*  CREATININE 2.85* 2.96* 3.33*  GLUCOSE 126* 136* 102*   Electrolytes  Recent Labs Lab 01/31/15 0525  02/04/15 0225 02/05/15 0221 02/06/15 0218  CALCIUM 8.3*  < > 7.5* 7.8* 7.9*  MG 2.1  --  2.1  --   --   PHOS 3.7  --  6.6*  --   --   < > = values in this interval not displayed. Sepsis Markers  Recent Labs Lab 01/24/2015 2033 01/31/15  0525 02/01/2015 1714  LATICACIDVEN 1.71 1.1 1.6   ABG  Recent Labs Lab 02/01/2015 1515  PHART 7.260*  PCO2ART 38.4  PO2ART 70.0*     Liver Enzymes  Recent Labs Lab 01/23/2015 1700 01/31/15 0525  AST 33 28  ALT 33 31  ALKPHOS 59 53  BILITOT 0.7 0.6  ALBUMIN 2.9* 2.5*   Cardiac Enzymes No results for input(s): TROPONINI, PROBNP in the last 168 hours. Glucose  Recent Labs Lab 02/05/15 1609 02/05/15 1937 02/05/15 2333 02/06/15 0351 02/06/15 0728 02/06/15 1131  GLUCAP 119* 162* 100* 100* 86 100*    Imaging Dg Chest Port 1 View  02/06/2015   CLINICAL DATA:  Hypoxia.  EXAM: PORTABLE CHEST - 1 VIEW  COMPARISON:  02/05/2015.  PET-CT 01/18/2015.  FINDINGS:  Mediastinum and hilar structures are unremarkable. Diffuse right lung infiltrate with atelectasis noted. Left lung is clear. Persistent right pleural thickening and/or effusion again noted . Cardiomegaly normal pulmonary vascularity.  IMPRESSION: Persistent right lung infiltrate and atelectasis with persistent right pleural thickening and/or effusion is again noted. Pleural thickening again consistent with pleural tumor as noted on prior PET-CT of 01/17/2015 . No interim change.   Electronically Signed   By: Marcello Moores  Register   On: 02/06/2015 09:12     ASSESSMENT / PLAN:  PULMONARY OETT 7/19 >> 7/20 A: Acute Respiratory Failure - unable to extubate post-op in PACU Former Smoker Adenocarcinoma - confirmed in R pleural fluid on thoracentesis, s/p PleurX per Dr. Prescott Gum 7/11 P:   Tolerating [place] well CXR, unchanged PRN albuterol given smoking history  Flutter valve Q4hr while awake Mucinex BID Follow up with ONC post discharge Monitor PleurX site -- asked nursing to drain 1 hour prior to going down for surgery tomorrow.  CARDIOVASCULAR CVL A:  PVD, severe -- ischemic LLE HTN - controlled HLD P:  Ortho to take to OR for BKA vs AKA of LLE  - ? Lt Great Toe as well? Continue lopressor, ASA, lipitor  ICU monitoring   RENAL A:   Acute on Chronic Renal Failure - baseline CKD IV (BL Cr ~1.0) Metabolic Acidosis - consider renal wasting 2/2 AoCKD Elevated CK P:   Trend BMP / UOP   - UOP down Replace electrolytes as indicated  NS at 154m/hr  GASTROINTESTINAL A:   Vent Associated Dysphagia  P:   PPI for SUP  Clear Liquid diet; advance as tol -- NPO after MN  HEMATOLOGIC A:   Mild Anemia  LE Ischemia -- worsening LLE Leukocytosis P:  Trend CBC  Heparin gtt per pharmacy  NO SCD's  Vascular checks  JP drains per VVS  INFECTIOUS A:   No acute infectious process  P:   Vanco 7/15 >> 7/22 Zosyn 7/15 >>  ENDOCRINE A:  DM   P:   CBG Q4 with SSI  Hold home regimen    NEUROLOGIC A:   Post-Operative Pain  Ischemic Pain P:   RASS goal: 0 Start Dilaudid PCA Palliative Care consulted -- appreciate the recs.     IElberta Leatherwood MD,MS,  PGY2 02/06/2015 2:16 PM   She is having severe pain in her Lt leg.  She has cough with chest congestion.  Appears uncomfortable, has b/l faint crackles, Lt leg swollen and tender.  Had detailed discussion with pt and her family.  Explained that amputation is likely best option given severity of her symptoms.  Will continue IV fluid.  Will drain pleural effusion tomorrow AM.  Continue pain medications.  Her  medical/pulmonary condition is optimized for surgery.  Plan for BKA/AKA lt leg on 7/23.  Chesley Mires, MD [facility] Pulmonary/Critical Care 02/06/2015, 2:45 PM Pager:  [phone] After 3pm call: [phone]

## 2015-02-06 NOTE — Progress Notes (Signed)
Daily Progress Note   Patient Name: April Kennedy       Date: 02/06/2015 DOB: [birth year]  Age: 81 y.o. MRN#: [id number] Attending Physician: Chesley Mires, MD Primary Care Physician: Kevan Ny, MD Admit Date: 01/26/2015  Reason for Consultation/Follow-up: Establishing goals of care, Non pain symptom management, Pain control and Psychosocial/spiritual support  Subjective:     -meet with patient and her daughter/Stephanie to continue discussion regarding diagnoses, prognosis,  GOC,  and options.  -patient and family informed me that a decision to move forward with amputation has been made, surgery has been notified and surgery is scheduled for tomorrow morning  - I addressed questions and concerns to the best of my ablity.   Family encouraged to call with questions or concerns.  PMT will continue to support holistically.  -patient was able to verbalized her desire for "quality life" "not just being prolonged".  This was helpful  for April Kennedy to hear in this unfortunate situation with a very poor prognosis regardless of intervention.  (1800) returned to room for f/u, pain continues to not be controlled satisfactorily (I can't get comfortable) adjustments to PCA dosing.  Spoke to Dr Lindi Adie regarding patient's decision for surgery   Length of Stay: 7 days  Current Medications: Scheduled Meds:  . antiseptic oral rinse  7 mL Mouth Rinse BID  . aspirin EC  81 mg Oral Daily  . atorvastatin  20 mg Oral q1800  . brimonidine  1 drop Left Eye TID  . dorzolamide  1 drop Left Eye TID  . HYDROmorphone PCA 0.3 mg/mL   Intravenous 6 times per day  . insulin aspart  0-15 Units Subcutaneous 6 times per day  . latanoprost  1 drop Left Eye QHS  . metoprolol tartrate  25 mg Oral BID  . pantoprazole  40 mg Oral QHS  . piperacillin-tazobactam (ZOSYN)  IV  2.25 g Intravenous 3 times per day  . senna  1 tablet Oral QHS  . sodium chloride  3 mL Intravenous Q12H    Continuous Infusions: . heparin        PRN Meds: [DISCONTINUED] acetaminophen **OR** acetaminophen, albuterol, hydrALAZINE, naloxone **AND** sodium chloride, ondansetron **OR** [DISCONTINUED] ondansetron (ZOFRAN) IV  Palliative Performance Scale: 30 % at best     Vital Signs: BP 111/61 mmHg  Pulse 104  Temp(Src) 97.6 F (36.4 C) (Oral)  Resp 18  Ht '5\' 4"'$  (1.626 m)  Wt 87.5 kg (192 lb 14.4 oz)  BMI 33.10 kg/m2  SpO2 98% SpO2: SpO2: 98 % O2 Device: O2 Device: Nasal Cannula O2 Flow Rate: O2 Flow Rate (L/min): 6 L/min  Intake/output summary:   Intake/Output Summary (Last 24 hours) at 02/06/15 0932 Last data filed at 02/06/15 0626  Gross per 24 hour  Intake 1070.75 ml  Output    381 ml  Net 689.75 ml   LBM: Last BM Date: 02/01/15 Baseline Weight: Weight: 84.732 kg (186 lb 12.8 oz) Most recent weight: Weight: 87.5 kg (192 lb 14.4 oz)  Physical Exam:          General: ill appearing, NAD HEENT: moist buccal membranes  GEX:[id number] Resp: CTA Neuro: alert and oriented X3   Additional Data Reviewed: Recent Labs     02/05/15  0221  02/06/15  0218  WBC  20.1*  15.4*  HGB  8.2*  7.9*  PLT  215  246  NA  139  138  BUN  35*  44*  CREATININE  2.96*  3.33*  Problem List:  Patient Active Problem List   Diagnosis Date Noted  . Palliative care encounter 02/05/2015  . DNR (do not resuscitate) discussion 02/05/2015  . Left leg cellulitis 01/29/2015  . Cellulitis 02/11/2015  . Foot pain, left 01/21/2015  . Malignant pleural effusion 01/26/2015  . Enlarged lymph nodes 01/19/2015  . Essential hypertension 01/18/2015  . Acute renal failure 01/18/2015  . Chronic kidney disease, stage 3 01/18/2015  . Pleural effusion, right 01/16/2015  . Elevated troponin 01/16/2015  . Glaucoma 08/24/2012     Palliative Care Assessment & Plan    Code Status:  Full code     Goals of Care:  -patient wishes to move forward with amputation in hopes of prolonged quality of life, decisions related to her  cancer diagnosis will be addressed at at later date depending on outcomes.   3. Symptom Management:   LLE Pain- increase Dilaudid PCA dose and hourly limits,    Dose is now 0.5 mg and hourly lockout is 3 mg/hr  Anxiety:  Ativan 1 mg po every 6 hrs prn  4. Palliative Prophylaxis:   Constipation:  Senna-s -one tablet po qhs   Care plan was discussed with nursing and Dr Lindi Adie  Thank you for allowing the Palliative Medicine Team to assist in the care of this patient.   Time In: 0900 Time Out:  0935 Total Time  35  minutes Prolonged Time Billed  no     Greater than 50%  of this time was spent counseling and coordinating care related to the above assessment and plan.   Knox Royalty, NP  02/06/2015, 9:32 AM  Please contact Palliative Medicine Team phone at [phone] for questions and concerns.

## 2015-02-06 NOTE — Clinical Social Work Note (Signed)
CSW consult acknowledged:  Clinical Social Worker received a consult indicating that patient's daughter, Fatima Sanger needed assistance with FMLA paperwork due to patient being scheduled for surgery on Saturday, 7/23.   CSW received a call from pt's dtr and explained FMLA paperwork process. Pt's dtr is employed by [employer] at [employer]. Pt's dtr reported she needed attending's information: name and all contact information (phone and fax). CSW provided MD's name and unit's contact information. Pt's dtr indicated she did NOT need MD's signature on FMLA paperwork. CSW directed pt's dtr to unit for additional assistance and encouraged her to contact weekend CSW as new social work needs arises.  Clinical Social Worker will sign off for now as social work intervention is no longer needed. Please consult [place] again if new need arises.  Glendon Axe, MSW, [place] [phone] 02/06/2015 4:08 PM

## 2015-02-06 NOTE — Anesthesia Preprocedure Evaluation (Addendum)
Anesthesia Evaluation  Patient identified by MRN, date of birth, ID bandGeneral Assessment Comment:Pt somnolent  Reviewed: Allergy & Precautions, NPO status , Patient's Chart, lab work & pertinent test results  History of Anesthesia Complications Negative for: history of anesthetic complications  Airway Mallampati: II  TM Distance: >3 FB Neck ROM: Full    Dental  (+) Edentulous Upper, Edentulous Lower   Pulmonary former smoker,  Adenocarcinoma lung: has pleurex for pleural effusion drainage + rhonchi  Decreased breath sounds on L    rales    Cardiovascular hypertension, Pt. on medications - angina+ Peripheral Vascular Disease Rhythm:Regular Rate:Tachycardia  01/29/2015 ECHO: EF 65-70% valves OK   Neuro/Psych negative neurological ROS     GI/Hepatic negative GI ROS, Neg liver ROS,   Endo/Other  diabetes (glu 122)Morbid obesity  Renal/GU Renal InsufficiencyRenal disease (creat 3.23)     Musculoskeletal   Abdominal (+) + obese,   Peds  Hematology  (+) Blood dyscrasia (Hb 8.2), ,   Anesthesia Other Findings   Reproductive/Obstetrics                        Anesthesia Physical Anesthesia Plan  ASA: IV  Anesthesia Plan: General   Post-op Pain Management:    Induction: Intravenous  Airway Management Planned: Oral ETT  Additional Equipment:   Intra-op Plan:   Post-operative Plan: Post-operative intubation/ventilation  Informed Consent: I have reviewed the patients History and Physical, chart, labs and discussed the procedure including the risks, benefits and alternatives for the proposed anesthesia with the patient or authorized representative who has indicated his/her understanding and acceptance.     Plan Discussed with: CRNA and Surgeon  Anesthesia Plan Comments: (Plan routine monitors, GETA with post op ventilation  DNR status addressed by Dr. Lamonte Sakai, who agrees with family that pt will  be ventilated post op )        Anesthesia Quick Evaluation

## 2015-02-06 NOTE — Progress Notes (Signed)
   VASCULAR SURGERY ASSESSMENT & PLAN:  * 3 Days Post-Op s/p:  1. Endarterectomy of left external iliac artery and common femoral artery with vein patch angioplasty (right GSV) 2. Embolectomy of anterior tibial artery, posterior tibial artery, and peroneal arteries. 3. Vein patch angioplasty of tibial perineal trunk and posterior tibial artery. (right GSV) 4. Endarterectomy of left superficial femoral artery with patch angioplasty (bovine pericardial patch) 5. Endarterectomy of the proximal left posterior tibial artery.  *  Pt now agreeable to proceed with Left BKA. Appreciate Dr. Jess Barters help and second opinion from Dr. Bridgett Larsson  * JP in left groin with minimal drainage => will discontinue.   SUBJECTIVE: Rest pain left foot  PHYSICAL EXAM: Filed Vitals:   02/06/15 1100 02/06/15 1200 02/06/15 1300 02/06/15 1400  BP: 84/36 113/65 125/83 119/57  Pulse: 96 84 90 101  Temp:      TempSrc:      Resp: '13 14 12 17  '$ Height:      Weight:      SpO2: 97% 97% 95% 96%   Incisions ok.  No change in left foot.   LABS: Lab Results  Component Value Date   WBC 15.4* 02/06/2015   HGB 7.9* 02/06/2015   HCT 23.9* 02/06/2015   MCV 79.4 02/06/2015   PLT 246 02/06/2015   Lab Results  Component Value Date   CREATININE 3.33* 02/06/2015   Lab Results  Component Value Date   INR 1.18 01/26/2015   CBG (last 3)   Recent Labs  02/06/15 0728 02/06/15 1131 02/06/15 1513  GLUCAP 86 100* 83    Active Problems:   Pleural effusion, right   Essential hypertension   Chronic kidney disease, stage 3   Left leg cellulitis   Cellulitis   Foot pain, left   Malignant pleural effusion   Palliative care encounter   DNR (do not resuscitate) discussion  Gae Gallop Beeper: [phone] 02/06/2015

## 2015-02-06 NOTE — Progress Notes (Signed)
ANTICOAGULATION & ANTIBIOTIC CONSULT NOTE - Follow Up Consult  Pharmacy Consult for Heparin & Vancomycin + Zosyn Indication: Ischemic left lower extremity  Allergies  Allergen Reactions  . Sulfa Antibiotics     Hives/ itching     Patient Measurements: Height: '5\' 4"'$  (162.6 cm) Weight: 192 lb 14.4 oz (87.5 kg) IBW/kg (Calculated) : 54.7 Heparin Dosing Weight: 74 kg  Vital Signs: Temp: 97.6 F (36.4 C) (07/22 0730) Temp Source: Oral (07/22 0730) BP: 111/61 mmHg (07/22 0700) Pulse Rate: 104 (07/22 0700)  Labs:  Recent Labs  02/04/15 0225 02/04/15 1408 02/05/15 0221 02/05/15 0730 02/06/15 0218  HGB 9.2*  --  8.2*  --  7.9*  HCT 28.1*  --  24.8*  --  23.9*  PLT 165  --  215  --  246  HEPARINUNFRC <0.10* 0.31  --  0.31 0.30  CREATININE 2.85*  --  2.96*  --  3.33*  CKTOTAL  --   --   --   --  1550*    Estimated Creatinine Clearance: 14.9 mL/min (by C-G formula based on Cr of 3.33).   Medications:  Heparin @ 1200 units/hr  Assessment: 31 YOF who continues on heparin for a ischemic LLE ss/p OR 7/19 with endarterectomy + embolectomy. Per VVS: No doppler flow to LLE - likely reclotted tibials despite heparin. Both ortho and VVS think amputation is the best palliative option - the family is considering. HL 0.3, Hgb 7.9 << 8.2, plts wnl. No overt s/sx of bleeding noted.   The patient also continues on Vancomycin + Zosyn for empiric infectious coverage. Afebrile, WBC 15.4 << 20.1 (no steroids), SCr continues to rise, up to 3.33 << 2.96, CrCl~10-15 ml/min, UOP/24h down to 0.2 ml/kg/hr. Will hold standing Vancomycin doses for now and will check a VR in the AM to determine clearance, Zosyn dose remains appropriate.   Goal of Therapy:  Heparin level 0.3-0.7 units/ml Monitor platelets by anticoagulation protocol: Yes  Vancomycin trough of 10-15 mcg/ml   Plan:  1. Increase Heparin drip rate slightly to 1250 units/hr (12.5 ml/hr) to keep within range 2. Hold standing Vancomycin  doses for now - will check a random Vanc level in the morning to determine clearance 3. Continue Zosyn 2.25g IV every 8 hours 4. Will continue to follow renal function, culture results, LOT, and antibiotic de-escalation plans   Alycia Rossetti, PharmD, BCPS Clinical Pharmacist Pager: [phone] 02/06/2015 9:24 AM

## 2015-02-07 ENCOUNTER — Encounter (HOSPITAL_COMMUNITY): Admission: EM | Disposition: E | Payer: Self-pay | Source: Home / Self Care | Attending: Internal Medicine

## 2015-02-07 ENCOUNTER — Inpatient Hospital Stay (HOSPITAL_COMMUNITY): Payer: Medicare Other

## 2015-02-07 ENCOUNTER — Inpatient Hospital Stay (HOSPITAL_COMMUNITY): Payer: Medicare Other | Admitting: Anesthesiology

## 2015-02-07 DIAGNOSIS — I739 Peripheral vascular disease, unspecified: Secondary | ICD-10-CM | POA: Diagnosis present

## 2015-02-07 DIAGNOSIS — I10 Essential (primary) hypertension: Secondary | ICD-10-CM

## 2015-02-07 DIAGNOSIS — J9601 Acute respiratory failure with hypoxia: Secondary | ICD-10-CM | POA: Diagnosis not present

## 2015-02-07 HISTORY — PX: AMPUTATION: SHX166

## 2015-02-07 LAB — GLUCOSE, CAPILLARY
GLUCOSE-CAPILLARY: 122 mg/dL — AB (ref 65–99)
Glucose-Capillary: 111 mg/dL — ABNORMAL HIGH (ref 65–99)
Glucose-Capillary: 92 mg/dL (ref 65–99)
Glucose-Capillary: 120 mg/dL — ABNORMAL HIGH (ref 65–99)
Glucose-Capillary: 114 mg/dL — ABNORMAL HIGH (ref 65–99)
Glucose-Capillary: 134 mg/dL — ABNORMAL HIGH (ref 65–99)

## 2015-02-07 LAB — COMPREHENSIVE METABOLIC PANEL
ALT: 34 U/L (ref 14–54)
AST: 63 U/L — AB (ref 15–41)
Albumin: 2 g/dL — ABNORMAL LOW (ref 3.5–5.0)
Alkaline Phosphatase: 69 U/L (ref 38–126)
Anion gap: 12 (ref 5–15)
BUN: 45 mg/dL — ABNORMAL HIGH (ref 6–20)
CALCIUM: 7.6 mg/dL — AB (ref 8.9–10.3)
CHLORIDE: 112 mmol/L — AB (ref 101–111)
CO2: 16 mmol/L — AB (ref 22–32)
Creatinine, Ser: 3.28 mg/dL — ABNORMAL HIGH (ref 0.44–1.00)
GFR calc Af Amer: 14 mL/min — ABNORMAL LOW (ref >60)
GFR, EST NON AFRICAN AMERICAN: 12 mL/min — AB (ref >60)
Glucose, Bld: 118 mg/dL — ABNORMAL HIGH (ref 65–99)
POTASSIUM: 4.3 mmol/L (ref 3.5–5.1)
SODIUM: 140 mmol/L (ref 135–145)
Total Bilirubin: 0.5 mg/dL (ref 0.3–1.2)
Total Protein: 5.6 g/dL — ABNORMAL LOW (ref 6.5–8.1)

## 2015-02-07 LAB — BLOOD GAS, ARTERIAL
Acid-base deficit: 13.2 mmol/L — ABNORMAL HIGH (ref 0.0–2.0)
Bicarbonate: 14.3 mEq/L — ABNORMAL LOW (ref 20.0–24.0)
Drawn by: 22766
FIO2: 1 %
MECHVT: 500 mL
O2 Saturation: 99.1 %
PCO2 ART: 45.7 mmHg — AB (ref 35.0–45.0)
PEEP/CPAP: 5 cmH2O
PH ART: 7.124 — AB (ref 7.350–7.450)
Patient temperature: 98.6
RATE: 14 resp/min
TCO2: 15.7 mmol/L (ref 0–100)
pO2, Arterial: 233 mmHg — ABNORMAL HIGH (ref 80.0–100.0)

## 2015-02-07 LAB — BASIC METABOLIC PANEL
Anion gap: 14 (ref 5–15)
Anion gap: 13 (ref 5–15)
BUN: 46 mg/dL — ABNORMAL HIGH (ref 6–20)
BUN: 47 mg/dL — AB (ref 6–20)
CALCIUM: 7.7 mg/dL — AB (ref 8.9–10.3)
CALCIUM: 7.4 mg/dL — AB (ref 8.9–10.3)
CHLORIDE: 113 mmol/L — AB (ref 101–111)
CO2: 13 mmol/L — ABNORMAL LOW (ref 22–32)
CO2: 15 mmol/L — ABNORMAL LOW (ref 22–32)
CREATININE: 3.23 mg/dL — AB (ref 0.44–1.00)
CREATININE: 3.31 mg/dL — AB (ref 0.44–1.00)
Chloride: 111 mmol/L (ref 101–111)
GFR calc non Af Amer: 13 mL/min — ABNORMAL LOW (ref >60)
GFR, EST AFRICAN AMERICAN: 15 mL/min — AB (ref >60)
GFR, EST AFRICAN AMERICAN: 14 mL/min — AB (ref >60)
GFR, EST NON AFRICAN AMERICAN: 12 mL/min — AB (ref >60)
Glucose, Bld: 121 mg/dL — ABNORMAL HIGH (ref 65–99)
Glucose, Bld: 126 mg/dL — ABNORMAL HIGH (ref 65–99)
Potassium: 4.4 mmol/L (ref 3.5–5.1)
Potassium: 4.3 mmol/L (ref 3.5–5.1)
Sodium: 140 mmol/L (ref 135–145)
Sodium: 139 mmol/L (ref 135–145)

## 2015-02-07 LAB — CBC WITH DIFFERENTIAL/PLATELET
Basophils Absolute: 0 10*3/uL (ref 0.0–0.1)
Basophils Relative: 0 % (ref 0–1)
Eosinophils Absolute: 0 10*3/uL (ref 0.0–0.7)
Eosinophils Relative: 0 % (ref 0–5)
HEMATOCRIT: 25.5 % — AB (ref 36.0–46.0)
Hemoglobin: 8.2 g/dL — ABNORMAL LOW (ref 12.0–15.0)
LYMPHS PCT: 6 % — AB (ref 12–46)
Lymphs Abs: 0.8 10*3/uL (ref 0.7–4.0)
MCH: 26 pg (ref 26.0–34.0)
MCHC: 32.2 g/dL (ref 30.0–36.0)
MCV: 81 fL (ref 78.0–100.0)
MONO ABS: 1.7 10*3/uL — AB (ref 0.1–1.0)
Monocytes Relative: 12 % (ref 3–12)
NEUTROS ABS: 11.7 10*3/uL — AB (ref 1.7–7.7)
NEUTROS PCT: 82 % — AB (ref 43–77)
Platelets: 300 10*3/uL (ref 150–400)
RBC: 3.15 MIL/uL — ABNORMAL LOW (ref 3.87–5.11)
RDW: 15 % (ref 11.5–15.5)
WBC: 14.2 10*3/uL — AB (ref 4.0–10.5)

## 2015-02-07 LAB — CBC
HCT: 25.3 % — ABNORMAL LOW (ref 36.0–46.0)
Hemoglobin: 8.2 g/dL — ABNORMAL LOW (ref 12.0–15.0)
MCH: 25.9 pg — ABNORMAL LOW (ref 26.0–34.0)
MCHC: 32.4 g/dL (ref 30.0–36.0)
MCV: 80.1 fL (ref 78.0–100.0)
PLATELETS: 284 10*3/uL (ref 150–400)
RBC: 3.16 MIL/uL — ABNORMAL LOW (ref 3.87–5.11)
RDW: 14.6 % (ref 11.5–15.5)
WBC: 13.4 10*3/uL — ABNORMAL HIGH (ref 4.0–10.5)

## 2015-02-07 LAB — TYPE AND SCREEN
ABO/RH(D): O POS
ANTIBODY SCREEN: NEGATIVE

## 2015-02-07 LAB — HEPARIN LEVEL (UNFRACTIONATED): Heparin Unfractionated: <0.1 IU/mL — ABNORMAL LOW (ref 0.30–0.70)

## 2015-02-07 LAB — PROTIME-INR
INR: 1.33 (ref 0.00–1.49)
Prothrombin Time: 16.6 seconds — ABNORMAL HIGH (ref 11.6–15.2)

## 2015-02-07 LAB — CK: Total CK: 1891 U/L — ABNORMAL HIGH (ref 38–234)

## 2015-02-07 SURGERY — AMPUTATION, ABOVE KNEE
Anesthesia: General | Site: Leg Upper | Laterality: Left

## 2015-02-07 MED ORDER — MIDAZOLAM HCL 2 MG/2ML IJ SOLN: 2.0000 mg | INTRAMUSCULAR | Status: DC | PRN

## 2015-02-07 MED ORDER — ROCURONIUM BROMIDE 50 MG/5ML IV SOLN
INTRAVENOUS | Status: AC
  Filled 2015-02-07: qty 1

## 2015-02-07 MED ORDER — DOCUSATE SODIUM 50 MG/5ML PO LIQD: 100.0000 mg | Freq: Two times a day (BID) | ORAL | Status: DC | PRN

## 2015-02-07 MED ORDER — CETYLPYRIDINIUM CHLORIDE 0.05 % MT LIQD
7.0000 mL | Freq: Four times a day (QID) | OROMUCOSAL | Status: DC
  Administered 2015-02-08 – 2015-02-12 (×17): 7 mL via OROMUCOSAL

## 2015-02-07 MED ORDER — LIDOCAINE HCL (CARDIAC) 20 MG/ML IV SOLN
INTRAVENOUS | Status: AC
  Filled 2015-02-07: qty 5

## 2015-02-07 MED ORDER — FENTANYL CITRATE (PF) 250 MCG/5ML IJ SOLN
INTRAMUSCULAR | Status: AC
  Filled 2015-02-07: qty 5

## 2015-02-07 MED ORDER — FENTANYL BOLUS VIA INFUSION
25.0000 ug | INTRAVENOUS | Status: DC | PRN
  Administered 2015-02-07: 50 ug via INTRAVENOUS
  Administered 2015-02-07: 25 ug via INTRAVENOUS
  Administered 2015-02-08: 50 ug via INTRAVENOUS
  Administered 2015-02-11 (×2): 25 ug via INTRAVENOUS
  Filled 2015-02-07: qty 25

## 2015-02-07 MED ORDER — DOCUSATE SODIUM 100 MG PO CAPS
100.0000 mg | ORAL_CAPSULE | Freq: Two times a day (BID) | ORAL | Status: DC | PRN
  Filled 2015-02-07: qty 1

## 2015-02-07 MED ORDER — SENNA 8.6 MG PO TABS
1.0000 | ORAL_TABLET | Freq: Every day | ORAL | Status: DC
  Administered 2015-02-07 – 2015-02-11 (×3): 8.6 mg
  Filled 2015-02-07 (×6): qty 1

## 2015-02-07 MED ORDER — FENTANYL CITRATE (PF) 100 MCG/2ML IJ SOLN
50.0000 ug | Freq: Once | INTRAMUSCULAR | Status: AC
  Administered 2015-02-07: 50 ug via INTRAVENOUS
  Filled 2015-02-07: qty 2

## 2015-02-07 MED ORDER — SODIUM CHLORIDE 0.45 % IV SOLN
INTRAVENOUS | Status: DC
  Administered 2015-02-07: 1000 mL via INTRAVENOUS
  Administered 2015-02-08 – 2015-02-10 (×4): via INTRAVENOUS

## 2015-02-07 MED ORDER — 0.9 % SODIUM CHLORIDE (POUR BTL) OPTIME
TOPICAL | Status: DC | PRN
  Administered 2015-02-07: 1000 mL

## 2015-02-07 MED ORDER — GLYCOPYRROLATE 0.2 MG/ML IJ SOLN
INTRAMUSCULAR | Status: AC
  Filled 2015-02-07: qty 1

## 2015-02-07 MED ORDER — CHLORHEXIDINE GLUCONATE 0.12 % MT SOLN
15.0000 mL | Freq: Two times a day (BID) | OROMUCOSAL | Status: DC
  Administered 2015-02-07 – 2015-02-12 (×10): 15 mL via OROMUCOSAL
  Filled 2015-02-07 (×8): qty 15

## 2015-02-07 MED ORDER — FENTANYL CITRATE (PF) 100 MCG/2ML IJ SOLN
INTRAMUSCULAR | Status: DC | PRN
  Administered 2015-02-07: 150 ug via INTRAVENOUS
  Administered 2015-02-07: 100 ug via INTRAVENOUS

## 2015-02-07 MED ORDER — METOPROLOL TARTRATE 25 MG/10 ML ORAL SUSPENSION
25.0000 mg | Freq: Two times a day (BID) | ORAL | Status: DC
  Administered 2015-02-07 – 2015-02-12 (×8): 25 mg
  Filled 2015-02-07 (×12): qty 10

## 2015-02-07 MED ORDER — ACETAMINOPHEN 325 MG PO TABS: 650.0000 mg | ORAL_TABLET | Freq: Four times a day (QID) | ORAL | Status: DC | PRN

## 2015-02-07 MED ORDER — ONDANSETRON HCL 4 MG/2ML IJ SOLN: 4.0000 mg | Freq: Four times a day (QID) | INTRAMUSCULAR | Status: DC | PRN

## 2015-02-07 MED ORDER — METOCLOPRAMIDE HCL 10 MG PO TABS: 5.0000 mg | ORAL_TABLET | Freq: Three times a day (TID) | ORAL | Status: DC | PRN

## 2015-02-07 MED ORDER — METOPROLOL TARTRATE 25 MG PO TABS: 25.0000 mg | ORAL_TABLET | Freq: Two times a day (BID) | ORAL | Status: DC

## 2015-02-07 MED ORDER — BISACODYL 10 MG RE SUPP: 10.0000 mg | Freq: Every day | RECTAL | Status: DC | PRN

## 2015-02-07 MED ORDER — ONDANSETRON HCL 4 MG PO TABS: 4.0000 mg | ORAL_TABLET | Freq: Four times a day (QID) | ORAL | Status: DC | PRN

## 2015-02-07 MED ORDER — MIDAZOLAM HCL 2 MG/2ML IJ SOLN
2.0000 mg | INTRAMUSCULAR | Status: DC | PRN
  Administered 2015-02-07 – 2015-02-08 (×2): 2 mg via INTRAVENOUS
  Filled 2015-02-07 (×2): qty 2

## 2015-02-07 MED ORDER — CEFAZOLIN SODIUM-DEXTROSE 2-3 GM-% IV SOLR: 2.0000 g | INTRAVENOUS | Status: DC

## 2015-02-07 MED ORDER — HEPARIN SODIUM (PORCINE) 5000 UNIT/ML IJ SOLN
5000.0000 [IU] | Freq: Three times a day (TID) | INTRAMUSCULAR | Status: DC
  Administered 2015-02-07 – 2015-02-10 (×9): 5000 [IU] via SUBCUTANEOUS
  Filled 2015-02-07 (×12): qty 1

## 2015-02-07 MED ORDER — ACETAMINOPHEN 650 MG RE SUPP: 650.0000 mg | Freq: Four times a day (QID) | RECTAL | Status: DC | PRN

## 2015-02-07 MED ORDER — SODIUM CHLORIDE 0.9 % IV SOLN
INTRAVENOUS | Status: DC | PRN
  Administered 2015-02-07: 08:00:00 via INTRAVENOUS

## 2015-02-07 MED ORDER — ALBUMIN HUMAN 5 % IV SOLN
INTRAVENOUS | Status: DC | PRN
  Administered 2015-02-07: 08:00:00 via INTRAVENOUS

## 2015-02-07 MED ORDER — SODIUM CHLORIDE 0.9 % IV SOLN
25.0000 ug/h | INTRAVENOUS | Status: DC
  Administered 2015-02-07: 50 ug/h via INTRAVENOUS
  Administered 2015-02-08: 200 ug/h via INTRAVENOUS
  Administered 2015-02-09: 100 ug/h via INTRAVENOUS
  Administered 2015-02-09: 17.5 ug/h via INTRAVENOUS
  Administered 2015-02-10: 100 ug/h via INTRAVENOUS
  Administered 2015-02-11 – 2015-02-12 (×2): 200 ug/h via INTRAVENOUS
  Filled 2015-02-07 (×7): qty 50

## 2015-02-07 MED ORDER — CEFAZOLIN SODIUM-DEXTROSE 2-3 GM-% IV SOLR
INTRAVENOUS | Status: DC | PRN
Start: 2015-02-07 — End: 2015-02-07
  Administered 2015-02-07: 2 g via INTRAVENOUS

## 2015-02-07 MED ORDER — PROPOFOL 1000 MG/100ML IV EMUL
INTRAVENOUS | Status: AC
  Administered 2015-02-07: 1000 mg
  Filled 2015-02-07: qty 100

## 2015-02-07 MED ORDER — PROPOFOL 10 MG/ML IV BOLUS
INTRAVENOUS | Status: DC | PRN
  Administered 2015-02-07: 100 mg via INTRAVENOUS

## 2015-02-07 MED ORDER — ROCURONIUM BROMIDE 100 MG/10ML IV SOLN
INTRAVENOUS | Status: DC | PRN
  Administered 2015-02-07: 40 mg via INTRAVENOUS

## 2015-02-07 MED ORDER — LIDOCAINE HCL (CARDIAC) 20 MG/ML IV SOLN
INTRAVENOUS | Status: DC | PRN
  Administered 2015-02-07: 50 mg via INTRAVENOUS

## 2015-02-07 MED ORDER — ATORVASTATIN CALCIUM 20 MG PO TABS
20.0000 mg | ORAL_TABLET | Freq: Every day | ORAL | Status: DC
  Administered 2015-02-07 – 2015-02-11 (×5): 20 mg
  Filled 2015-02-07 (×6): qty 1

## 2015-02-07 MED ORDER — METOCLOPRAMIDE HCL 5 MG/ML IJ SOLN
5.0000 mg | Freq: Three times a day (TID) | INTRAMUSCULAR | Status: DC | PRN
  Filled 2015-02-07: qty 2

## 2015-02-07 MED ORDER — CISATRACURIUM BESYLATE 20 MG/10ML IV SOLN
INTRAVENOUS | Status: AC
  Filled 2015-02-07: qty 10

## 2015-02-07 SURGICAL SUPPLY — 38 items
BLADE SAW RECIP 87.9 MT (BLADE) ×4 IMPLANT
BLADE SURG 21 STRL SS (BLADE) ×4 IMPLANT
BNDG COHESIVE 6X5 TAN STRL LF (GAUZE/BANDAGES/DRESSINGS) ×8 IMPLANT
BNDG GAUZE ELAST 4 BULKY (GAUZE/BANDAGES/DRESSINGS) ×8 IMPLANT
COVER SURGICAL LIGHT HANDLE (MISCELLANEOUS) ×8 IMPLANT
CUFF TOURNIQUET SINGLE 34IN LL (TOURNIQUET CUFF) IMPLANT
CUFF TOURNIQUET SINGLE 44IN (TOURNIQUET CUFF) IMPLANT
DRAPE EXTREMITY T 121X128X90 (DRAPE) ×4 IMPLANT
DRAPE PROXIMA HALF (DRAPES) ×8 IMPLANT
DRAPE U-SHAPE 47X51 STRL (DRAPES) ×4 IMPLANT
DRSG ADAPTIC 3X8 NADH LF (GAUZE/BANDAGES/DRESSINGS) ×4 IMPLANT
DRSG AQUACEL AG ADV 3.5X14 (GAUZE/BANDAGES/DRESSINGS) ×4 IMPLANT
DRSG PAD ABDOMINAL 8X10 ST (GAUZE/BANDAGES/DRESSINGS) ×4 IMPLANT
DURAPREP 26ML APPLICATOR (WOUND CARE) ×4 IMPLANT
ELECT REM PT RETURN 9FT ADLT (ELECTROSURGICAL) ×4
ELECTRODE REM PT RTRN 9FT ADLT (ELECTROSURGICAL) ×2 IMPLANT
GAUZE SPONGE 4X4 12PLY STRL (GAUZE/BANDAGES/DRESSINGS) ×4 IMPLANT
GLOVE BIOGEL PI IND STRL 9 (GLOVE) ×2 IMPLANT
GLOVE BIOGEL PI INDICATOR 9 (GLOVE) ×2
GLOVE SURG ORTHO 9.0 STRL STRW (GLOVE) ×4 IMPLANT
GOWN STRL REUS W/ TWL XL LVL3 (GOWN DISPOSABLE) ×4 IMPLANT
GOWN STRL REUS W/TWL XL LVL3 (GOWN DISPOSABLE) ×4
KIT BASIN OR (CUSTOM PROCEDURE TRAY) ×4 IMPLANT
KIT ROOM TURNOVER OR (KITS) ×4 IMPLANT
MANIFOLD NEPTUNE II (INSTRUMENTS) ×4 IMPLANT
NS IRRIG 1000ML POUR BTL (IV SOLUTION) ×4 IMPLANT
PACK GENERAL/GYN (CUSTOM PROCEDURE TRAY) ×4 IMPLANT
PAD ARMBOARD 7.5X6 YLW CONV (MISCELLANEOUS) ×8 IMPLANT
SPONGE LAP 18X18 X RAY DECT (DISPOSABLE) IMPLANT
STAPLER VISISTAT 35W (STAPLE) IMPLANT
STOCKINETTE IMPERVIOUS LG (DRAPES) ×4 IMPLANT
SUT ETHILON 2 0 PSLX (SUTURE) ×16 IMPLANT
SUT SILK 2 0 (SUTURE) ×2
SUT SILK 2-0 18XBRD TIE 12 (SUTURE) ×2 IMPLANT
SUT VIC AB 1 CTX 27 (SUTURE) IMPLANT
TOWEL OR 17X24 6PK STRL BLUE (TOWEL DISPOSABLE) ×4 IMPLANT
TOWEL OR 17X26 10 PK STRL BLUE (TOWEL DISPOSABLE) ×4 IMPLANT
WATER STERILE IRR 1000ML POUR (IV SOLUTION) ×4 IMPLANT

## 2015-02-07 NOTE — Op Note (Signed)
01/26/2015 - 01/28/2015  8:46 AM  PATIENT:  April Kennedy    PRE-OPERATIVE DIAGNOSIS:  Gangrene Left Leg  POST-OPERATIVE DIAGNOSIS:  Same  PROCEDURE:  AMPUTATION ABOVE KNEE left  SURGEON:  Newt Minion, MD  PHYSICIAN ASSISTANT:None ANESTHESIA:   General  PREOPERATIVE INDICATIONS:  IRIDIAN READER is a  81 y.o. female with a diagnosis of Gangrene Left Foot who failed conservative measures and elected for surgical management.    The risks benefits and alternatives were discussed with the patient preoperatively including but not limited to the risks of infection, bleeding, nerve injury, cardiopulmonary complications, the need for revision surgery, among others, and the patient was willing to proceed.  OPERATIVE IMPLANTS: none  OPERATIVE FINDINGS: Healthy viable muscle  OPERATIVE PROCEDURE: Patient was brought to the operating room and underwent a general and aesthetic. After adequate levels anesthesia obtained patient's left lower extremity was prepped using DuraPrep draped into a sterile field the leg was draped out of the field with impervious stockinette. A timeout was called. A fishmouth incision was made just proximal to the patella. Bovie was used to transect down to the bone. The bone was transected. The femoral vessels were suture ligated with 2-0 silk. The leg was delivered off the table. Hemostasis was obtained. There was minimal blood loss. The deep and superficial fascial layers and skin was closed using 2-0 nylon. A aqua cell AG dressing was applied. Due to patient's pulmonary status she was returned back to the ICU intubated.

## 2015-02-07 NOTE — Transfer of Care (Signed)
Immediate Anesthesia Transfer of Care Note  Patient: April Kennedy  Procedure(s) Performed: Procedure(s): AMPUTATION ABOVE KNEE  Patient Location: ICU  Anesthesia Type:General  Level of Consciousness: sedated, unresponsive and Patient remains intubated per anesthesia plan  Airway & Oxygen Therapy: Patient remains intubated per anesthesia plan and Patient placed on Ventilator (see vital sign flow sheet for setting)  Post-op Assessment: Report given to RN and Post -op Vital signs reviewed and stable  Post vital signs: Reviewed and stable  Last Vitals:  Filed Vitals:   01/24/2015 0600  BP: 127/78  Pulse: 100  Temp:   Resp: 13    Complications: No apparent anesthesia complications

## 2015-02-07 NOTE — Anesthesia Postprocedure Evaluation (Signed)
  Anesthesia Post-op Note  Patient: April Kennedy  Procedure(s) Performed: Procedure(s): AMPUTATION ABOVE KNEE  Patient Location: ICU  Anesthesia Type:General  Level of Consciousness: sedated and Patient remains intubated per anesthesia plan  Airway and Oxygen Therapy: Patient remains intubated per anesthesia plan and Patient placed on Ventilator (see vital sign flow sheet for setting)  Post-op Pain: none  Post-op Assessment: Post-op Vital signs reviewed, Patient's Cardiovascular Status Stable, Respiratory Function Stable, Patent Airway, No signs of Nausea or vomiting and Pain level controlled LLE Motor Response: Purposeful movement, Responds to commands LLE Sensation: Increased RLE Motor Response: Purposeful movement, Responds to commands RLE Sensation: Full sensation      Post-op Vital Signs: Reviewed and stable  Last Vitals:  Filed Vitals:   01/16/2015 0945  BP: 124/52  Pulse:   Temp:   Resp: 16    Complications: No apparent anesthesia complications

## 2015-02-07 NOTE — Progress Notes (Signed)
Approximately 15cc of Dilaudid PCA waisted in sink, witnessed by Candelaria Celeste, RN.

## 2015-02-07 NOTE — Anesthesia Procedure Notes (Signed)
Procedure Name: Intubation Date/Time: 02/14/2015 8:08 AM Performed by: Eligha Bridegroom Pre-anesthesia Checklist: Patient identified, Emergency Drugs available, Suction available, Patient being monitored and Timeout performed Patient Re-evaluated:Patient Re-evaluated prior to inductionOxygen Delivery Method: Circle system utilized Preoxygenation: Pre-oxygenation with 100% oxygen Intubation Type: IV induction Ventilation: Mask ventilation without difficulty and Oral airway inserted - appropriate to patient size Tube type: Subglottic suction tube Tube size: 7.5 mm Number of attempts: 1 Airway Equipment and Method: Stylet Placement Confirmation: ETT inserted through vocal cords under direct vision,  CO2 detector and positive ETCO2 Secured at: 21 cm Tube secured with: Tape Dental Injury: Teeth and Oropharynx as per pre-operative assessment

## 2015-02-07 NOTE — Progress Notes (Signed)
PULMONARY / CRITICAL CARE MEDICINE   Name: April Kennedy MRN: [id number] DOB: [birth year]    ADMISSION DATE:  02/01/2015 CONSULTATION DATE:  01/24/2015  REFERRING MD :  Dr. 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  Work up concerning for left > right foot ischemia.  Patient was taken to the OR on 7/19 for L endarterectomy and was unable to extubate post-op.  Returned to ICU for monitoring   STUDIES:  7/15  Venous Duplex >> no DVT in RLE    SIGNIFICANT EVENTS: 7/11  R PleurX placed per Dr. Prescott Gum for metastatic effusion  7/15  PET >> small right pleural effusion (pleurX cath in place, placed on 7/11 per Dr. Prescott Gum, PET scan done showed hypermetabolic pleural thickening of the R hemothorax, metastatic spread to lung, mediastinum and L supraclavicular node. 7/15  MRI Brain >> normal appearance of brain 7/15  Admit with L toe pain, swelling.  Concerns for BLE ischemia  7/19  To OR for L endarterectomy 7/20 Extubation  SUBJECTIVE / Interval Events:  Pt with high wob through the night, still with LE pain.  pleurex drained for small amt this am She arrived to pre-op for LE amputation this am in resp distress, tachypneic, poor secretion management   VITAL SIGNS: Temp:  [98.1 F (36.7 C)-98.9 F (37.2 C)] 98.1 F (36.7 C) (07/23 0306) Pulse Rate:  [84-115] 100 (07/23 0600) Resp:  [12-24] 13 (07/23 0600) BP: (84-153)/(36-104) 127/78 mmHg (07/23 0600) SpO2:  [93 %-99 %] 94 % (07/23 0600)   INTAKE / OUTPUT:  Intake/Output Summary (Last 24 hours) at 01/31/2015 0752 Last data filed at 02/13/2015 0700  Gross per 24 hour  Intake 2817.25 ml  Output   1075 ml  Net 1742.25 ml    PHYSICAL EXAMINATION: General:  Elderly, in pain but managing w PCA, mild to mod resp distress Neuro:  Alert,  oriented, no focal deficits appreciated. Speaks clearly HEENT:  MMM, EOMI, no JVD, upper airway secretions audible but she cleared when prompted Cardiovascular:  RRR, no murmur Lungs: lungs bilaterally coarse, diminished RLL, Rt sided tube in place Abdomen:  Obese/soft, +BS Musculoskeletal:  No bony deformities.  Skin: LLE cool to touch w/ all 5 toes darkened coloration, RLE w/ +2 pitting edema (but warm) and Rt great toe w/ darkened discoloration to skin, surgical incision in L femoral area and L medial calf - clean/dry  LABS:  CBC  Recent Labs Lab 02/05/15 0221 02/06/15 0218 02/06/2015 0220  WBC 20.1* 15.4* 13.4*  HGB 8.2* 7.9* 8.2*  HCT 24.8* 23.9* 25.3*  PLT 215 246 284   Coag's No results for input(s): APTT, INR in the last 168 hours.   BMET  Recent Labs Lab 02/05/15 0221 02/06/15 0218 02/01/2015 0220  NA 139 138 140  K 4.1 4.5 4.4  CL 111 107 113*  CO2 18* 18* 13*  BUN 35* 44* 46*  CREATININE 2.96* 3.33* 3.23*  GLUCOSE 136* 102* 121*   Electrolytes  Recent Labs Lab 02/04/15 0225 02/05/15 0221 02/06/15 0218 01/29/2015 0220  CALCIUM 7.5* 7.8* 7.9* 7.7*  MG 2.1  --   --   --   PHOS 6.6*  --   --   --    Sepsis Markers  Recent Labs Lab 02/14/2015 1714  LATICACIDVEN 1.6  ABG  Recent Labs Lab 02/08/2015 1515  PHART 7.260*  PCO2ART 38.4  PO2ART 70.0*     Liver Enzymes No results for input(s): AST, ALT, ALKPHOS, BILITOT, ALBUMIN in the last 168 hours. Cardiac Enzymes No results for input(s): TROPONINI, PROBNP in the last 168 hours. Glucose  Recent Labs Lab 02/06/15 0728 02/06/15 1131 02/06/15 1513 02/06/15 2011 02/06/15 2320 01/23/2015 0303  GLUCAP 86 100* 83 124* 117* 122*    Imaging Dg Chest Port 1 View  02/06/2015   CLINICAL DATA:  Hypoxia.  EXAM: PORTABLE CHEST - 1 VIEW  COMPARISON:  02/05/2015.  PET-CT 02/10/2015.  FINDINGS: Mediastinum and hilar structures are unremarkable. Diffuse right lung infiltrate with atelectasis noted. Left lung  is clear. Persistent right pleural thickening and/or effusion again noted . Cardiomegaly normal pulmonary vascularity.  IMPRESSION: Persistent right lung infiltrate and atelectasis with persistent right pleural thickening and/or effusion is again noted. Pleural thickening again consistent with pleural tumor as noted on prior PET-CT of 01/28/2015 . No interim change.   Electronically Signed   By: Marcello Moores  Register   On: 02/06/2015 09:12    ASSESSMENT / PLAN:  PULMONARY OETT 7/19 >> 7/20 A: Acute Respiratory Failure due to sepsis, pain / narcs with poor secretion clearance. Probably also component volume overload Former Smoker Adenocarcinoma - confirmed in R pleural fluid on thoracentesis, s/p PleurX per Dr. Prescott Gum 7/11 P:   Anticipate that she will return from the OR 7/23 intubated. Discussed this with family prior to sgy 7/23 Drain pleurex prn PRN albuterol given smoking history  Mucinex BID Follow up with ONC post discharge Monitor PleurX site  CARDIOVASCULAR CVL A:  PVD, severe -- ischemic LLE HTN - controlled HLD P:  Ortho to take to OR for BKA vs AKA of LLE (Probably the latter)  - ? Lt Great Toe as well? Continue lopressor, ASA, lipitor post-op if BP tolerates ICU monitoring post op  RENAL A:   Acute on Chronic Renal Failure - baseline CKD IV (BL Cr ~5.3) Metabolic Acidosis - consider renal wasting 2/2 AoCKD Elevated CK P:   Trend BMP / UOP  Replace electrolytes as indicated  NS at 138m/hr > will plan to decrease post-op as CXR suggests increasing interstitial edema  GASTROINTESTINAL A:   Vent Associated Dysphagia  P:   PPI for SUP  TF's if unable to extubate post-op  HEMATOLOGIC A:   Mild Anemia  LE Ischemia -- worsening LLE Leukocytosis P:  Trend CBC  Heparin gtt per pharmacy  NO SCD's  Vascular checks  JP drains per VVS  INFECTIOUS A:   LE gangrene P:   Vanco 7/15 >> 7/22 Zosyn 7/15 >>  ENDOCRINE A:  DM   P:   CBG Q4 with SSI  Hold  home regimen   NEUROLOGIC A:   Post-Operative Pain  Ischemic Pain P:   RASS goal: 0 Start Dilaudid PCA Palliative Care consulted -- appreciate the recs.   GLOBAL / family: Dr BLamonte Sakaidiscussed current status and plans with pt's daughters at bedside pre-op 7/23. All understand that the patient has expressed that she wants to be DNR, but that we are accepting intubation and aggressive care peri-operatively to give her a chance to show some recovery to acceptable QOL. They understand that she will come back to the ICU intubated, that we will assess her for extubation over a few days, that we will consider withdrawal of care if she does not extubate in that time period based on her wishes.  Independent CC time 33 minutes   Baltazar Apo, MD, PhD 01/16/2015, 8:16 AM [facility] [phone] or if no answer [phone]

## 2015-02-07 NOTE — Progress Notes (Signed)
Initial Nutrition Assessment  DOCUMENTATION CODES:   Obesity unspecified  INTERVENTION:    Recommend initiate TF via OGT with Vital High Protein at 25 ml/h and Prostat 30 ml BID on day 1; on day 2, d/c Prostat and increase to goal rate of 50 ml/h (1200 ml per day) to provide 1200 kcals, 105 gm protein, 1003 ml free water daily.  NUTRITION DIAGNOSIS:   Inadequate oral intake related to inability to eat as evidenced by NPO status.  GOAL:   Provide needs based on ASPEN/SCCM guidelines  MONITOR:   Vent status, Labs, Weight trends  REASON FOR ASSESSMENT:   Ventilator    ASSESSMENT:   Patient admitted on 7/15 with leg swelling and pain. Failed conservative treatment for gangrene and required AKA on 7/23.  Back from the OR this AM on a ventilator.  Patient is currently intubated on ventilator support MV: 9.2 L/min Temp (24hrs), Avg:98.5 F (36.9 C), Min:98.1 F (36.7 C), Max:98.9 F (37.2 C)  Propofol: none  Diet Order:  Diet NPO time specified Diet Carb Modified Fluid consistency:: Thin; Room service appropriate?: Yes  Skin:  Wound (see comment) (left leg amputation site)  Last BM:  7/22  Height:   Ht Readings from Last 1 Encounters:  01/16/2015 '5\' 4"'$  (1.626 m)    Weight:   Wt Readings from Last 1 Encounters:  02/04/15 192 lb 14.4 oz (87.5 kg)    Ideal Body Weight:  54.5 kg  Wt Readings from Last 10 Encounters:  02/04/15 192 lb 14.4 oz (87.5 kg)  01/27/15 181 lb 10.5 oz (82.4 kg)    BMI:  Body mass index is 33.1 kg/(m^2).  Estimated Nutritional Needs:   Kcal:  [phone]  Protein:  109 gm  Fluid:  1.5-2 L  EDUCATION NEEDS:    No education needs identified at this time   Molli Barrows, [place], [place], [place] Pager [phone] After Hours Pager [phone]

## 2015-02-08 ENCOUNTER — Inpatient Hospital Stay (HOSPITAL_COMMUNITY): Payer: Medicare Other

## 2015-02-08 DIAGNOSIS — J9601 Acute respiratory failure with hypoxia: Secondary | ICD-10-CM

## 2015-02-08 LAB — CBC
HCT: 23.4 % — ABNORMAL LOW (ref 36.0–46.0)
HEMOGLOBIN: 7.7 g/dL — AB (ref 12.0–15.0)
MCH: 26.4 pg (ref 26.0–34.0)
MCHC: 32.9 g/dL (ref 30.0–36.0)
MCV: 80.1 fL (ref 78.0–100.0)
Platelets: 300 10*3/uL (ref 150–400)
RBC: 2.92 MIL/uL — ABNORMAL LOW (ref 3.87–5.11)
RDW: 15 % (ref 11.5–15.5)
WBC: 13 10*3/uL — AB (ref 4.0–10.5)

## 2015-02-08 LAB — PHOSPHORUS: Phosphorus: 8 mg/dL — ABNORMAL HIGH (ref 2.5–4.6)

## 2015-02-08 LAB — MAGNESIUM: MAGNESIUM: 2.5 mg/dL — AB (ref 1.7–2.4)

## 2015-02-08 LAB — GLUCOSE, CAPILLARY
GLUCOSE-CAPILLARY: 115 mg/dL — AB (ref 65–99)
GLUCOSE-CAPILLARY: 123 mg/dL — AB (ref 65–99)
GLUCOSE-CAPILLARY: 96 mg/dL (ref 65–99)
GLUCOSE-CAPILLARY: 98 mg/dL (ref 65–99)
Glucose-Capillary: 108 mg/dL — ABNORMAL HIGH (ref 65–99)
Glucose-Capillary: 103 mg/dL — ABNORMAL HIGH (ref 65–99)

## 2015-02-08 LAB — BASIC METABOLIC PANEL
Anion gap: 13 (ref 5–15)
BUN: 55 mg/dL — AB (ref 6–20)
CO2: 14 mmol/L — AB (ref 22–32)
Calcium: 7.6 mg/dL — ABNORMAL LOW (ref 8.9–10.3)
Chloride: 112 mmol/L — ABNORMAL HIGH (ref 101–111)
Creatinine, Ser: 3.9 mg/dL — ABNORMAL HIGH (ref 0.44–1.00)
GFR calc Af Amer: 12 mL/min — ABNORMAL LOW (ref >60)
GFR, EST NON AFRICAN AMERICAN: 10 mL/min — AB (ref >60)
GLUCOSE: 117 mg/dL — AB (ref 65–99)
POTASSIUM: 4.7 mmol/L (ref 3.5–5.1)
Sodium: 139 mmol/L (ref 135–145)

## 2015-02-08 MED ORDER — PANTOPRAZOLE SODIUM 40 MG PO PACK
40.0000 mg | PACK | Freq: Every day | ORAL | Status: DC
  Administered 2015-02-08 – 2015-02-12 (×5): 40 mg
  Filled 2015-02-08 (×5): qty 20

## 2015-02-08 MED ORDER — ASPIRIN 81 MG PO CHEW
81.0000 mg | CHEWABLE_TABLET | Freq: Every day | ORAL | Status: DC
  Administered 2015-02-08 – 2015-02-12 (×5): 81 mg
  Filled 2015-02-08 (×5): qty 1

## 2015-02-08 NOTE — Progress Notes (Signed)
Pt returned to full support ventilation due to excessive accessory muscle use and agitation. RN notified.

## 2015-02-08 NOTE — Progress Notes (Signed)
PULMONARY / CRITICAL CARE MEDICINE   Name: April Kennedy MRN: [id number] DOB: [birth year]    ADMISSION DATE:  01/24/2015 CONSULTATION DATE:  01/26/2015  REFERRING MD :  Dr. 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  Work up concerning for left > right foot ischemia.  Patient was taken to the OR on 7/19 for L endarterectomy and was unable to extubate post-op.  Returned to ICU for monitoring   STUDIES:  7/15  Venous Duplex >> no DVT in RLE    SIGNIFICANT EVENTS: 7/11  R PleurX placed per Dr. Prescott Gum for metastatic effusion  7/15  PET >> small right pleural effusion (pleurX cath in place, placed on 7/11 per Dr. Prescott Gum, PET scan done showed hypermetabolic pleural thickening of the R hemothorax, metastatic spread to lung, mediastinum and L supraclavicular node. 7/15  MRI Brain >> normal appearance of brain 7/15  Admit with L toe pain, swelling.  Concerns for BLE ischemia  7/19  To OR for L endarterectomy 7/20 Extubation  SUBJECTIVE / Interval Events:  Tolerating  PSV this am, gets good volumes but has accessory muscle use.   VITAL SIGNS: Temp:  [98.6 F (37 C)-99.2 F (37.3 C)] 98.9 F (37.2 C) (07/24 0759) Pulse Rate:  [91-116] 105 (07/24 1000) Resp:  [12-38] 15 (07/24 1000) BP: (90-148)/(37-96) 122/42 mmHg (07/24 1000) SpO2:  [85 %-100 %] 95 % (07/24 1000) FiO2 (%):  [40 %-100 %] 40 % (07/24 1004)   INTAKE / OUTPUT:  Intake/Output Summary (Last 24 hours) at 02/08/15 1121 Last data filed at 02/08/15 1000  Gross per 24 hour  Intake 2638.93 ml  Output    632 ml  Net 2006.93 ml    PHYSICAL EXAMINATION: General:  Elderly, ventilated Neuro:  Tries to open eyes to voice, not following commands, moves B UE HEENT:  MMM, ETT in place Cardiovascular:  RRR, no murmur Lungs: lungs  bilaterally coarse, diminished RLL, Rt sided tube in place Abdomen:  Obese/soft, +BS Musculoskeletal:  No bony deformities.  Skin: LLE AKA wound clean,  Rt great toe w/ darkened discoloration to skin,   LABS:  CBC  Recent Labs Lab 02/11/2015 0220 02/10/2015 1105 02/08/15 0255  WBC 13.4* 14.2* 13.0*  HGB 8.2* 8.2* 7.7*  HCT 25.3* 25.5* 23.4*  PLT 284 300 300   Coag's  Recent Labs Lab 01/29/2015 1105  INR 1.33     BMET  Recent Labs Lab 01/18/2015 1105 01/27/2015 1558 02/08/15 0810  NA 140 139 139  K 4.3 4.3 4.7  CL 112* 111 112*  CO2 16* 15* 14*  BUN 45* 47* 55*  CREATININE 3.28* 3.31* 3.90*  GLUCOSE 118* 126* 117*   Electrolytes  Recent Labs Lab 02/04/15 0225  01/29/2015 1105 02/14/2015 1558 02/08/15 0810  CALCIUM 7.5*  < > 7.6* 7.4* 7.6*  MG 2.1  --   --   --  2.5*  PHOS 6.6*  --   --   --  8.0*  < > = values in this interval not displayed. Sepsis Markers  Recent Labs Lab 01/21/2015 1714  LATICACIDVEN 1.6   ABG  Recent Labs Lab 01/21/2015 1515 01/24/2015 1200  PHART 7.260* 7.124*  PCO2ART 38.4 45.7*  PO2ART 70.0* 233*     Liver Enzymes  Recent Labs Lab 02/06/2015 1105  AST  63*  ALT 34  ALKPHOS 69  BILITOT 0.5  ALBUMIN 2.0*   Cardiac Enzymes No results for input(s): TROPONINI, PROBNP in the last 168 hours. Glucose  Recent Labs Lab 02/01/2015 0754 02/08/2015 1131 02/10/2015 1539 01/31/2015 1956 02/08/15 0411 02/08/15 0756  GLUCAP 92 120* 114* 134* 115* 123*    Imaging Dg Chest Port 1 View  02/08/2015   CLINICAL DATA:  Ventilator dependent respiratory failure. Malignant right pleural effusion.  EXAM: PORTABLE CHEST - 1 VIEW  COMPARISON:  02/09/2015 and earlier.  FINDINGS: Endotracheal tube tip in satisfactory position approximately 4 cm above the carina. Nasogastric tube courses below the diaphragm into the stomach. PleurX drainage catheter in the medial right pleural space. New large bore right chest tube also in the medial pleural space. Loculated  lateral basilar and apical right pleural effusion. Interstitial and airspace opacities throughout the right lung, stable over the past 6 days. Interstitial and airspace opacity to a lesser degree in the left mid lung and left base, also unchanged. No new pulmonary parenchymal abnormality.  IMPRESSION: 1. Endotracheal tube and nasogastric tube satisfactory. 2. New right large bore chest tube present in the medial pleural space (as is the indwelling PleurX catheter), though the majority of the loculated pleural fluid is in the lateral base and lateral apex of the right hemithorax. 3. Stable interstitial and airspace opacities throughout the right lung and to a lesser degree in the left mid lung and left lung base, lymphangitic spread of tumor favored over pneumonia. 4. No new abnormalities.   Electronically Signed   By: Evangeline Dakin M.D.   On: 02/08/2015 09:19   Dg Chest Port 1 View  02/14/2015   CLINICAL DATA:  Check endotracheal tube placement  EXAM: PORTABLE CHEST - 1 VIEW  COMPARISON:  Film from earlier in the same day.  FINDINGS: Cardiac shadow is stable. Persistent right-sided effusion and infiltrate is noted. An endotracheal tube is now been placed lying 4 cm above the carina. A nasogastric catheter is noted within the stomach. A PleurX catheter is seen on the right  IMPRESSION: Stable changes in the right lung.  Endotracheal tube in satisfactory position.   Electronically Signed   By: Inez Catalina M.D.   On: 01/18/2015 13:09   Dg Abd Portable 1v  02/09/2015   CLINICAL DATA:  Orogastric tube placement at bedside. Current history of metastatic lung cancer  EXAM: PORTABLE ABDOMEN - 1 VIEW  COMPARISON:  01/29/2015.  FINDINGS: Orogastric tube tip projects over the expected position of the gastric fundus. Mild gaseous distention of jejunal loops in the left upper quadrant, improved since the examination 4 days ago. Bowel gas pattern otherwise unremarkable. Loculated right pleural effusion with indwelling  PleurX drainage catheter and associated passive atelectasis in the right lower lobe, unchanged.  IMPRESSION: 1. OG tube tip in the fundus of the stomach. 2. Localized ileus involving the jejunum in the left upper quadrant, improved since 4 days ago. 3. Stable loculated right pleural effusion with indwelling PleurX drainage catheter. Associated passive atelectasis in the right lower lobe, unchanged.   Electronically Signed   By: Evangeline Dakin M.D.   On: 02/13/2015 11:46    ASSESSMENT / PLAN:  PULMONARY OETT 7/19 >> 7/20 OETT 7/23 >>  A: Acute Respiratory Failure due to sepsis, pain / narcs with poor secretion clearance. Probably also component volume overload > extubated.  Reintubated to facilitate L AKA, back to ICU on MV Former Smoker Adenocarcinoma - confirmed in R pleural fluid on  thoracentesis, s/p PleurX per Dr. Prescott Gum 7/11 P:   Tolerating PSV, would like her to be more awake and a bit stronger before attempting extubation. Family would not want prolonged intubation if she does not progress Drain pleurex prn, about qod PRN albuterol given smoking history  Mucinex BID Follow up with ONC post discharge Monitor PleurX site  CARDIOVASCULAR CVL A:  PVD, severe -- ischemic LLE, s/p AKA HTN - controlled HLD P:  Continue lopressor, ASA, lipitor post-op if BP tolerates ICU monitoring post op  RENAL A:   Acute on Chronic Renal Failure - baseline CKD IV (BL Cr ~1.9), worsening 3/26 Metabolic Acidosis - consider renal wasting 2/2 AoCKD Elevated CK P:   Trend BMP / UOP , hopefully will rebound now that ischemic leg has been addressed Replace electrolytes as indicated  NS at 18m/hr She would be a poor HD candidate  GASTROINTESTINAL A:   Vent Associated Dysphagia  P:   PPI for SUP  TF's 7/25 if unable to extubate  HEMATOLOGIC A:   Mild Anemia  LE Ischemia -- worsening LLE Leukocytosis P:  Trend CBC  Heparin gtt off, no need to restart post-op NO SCD's  Vascular  checks  JP drains per VVS  INFECTIOUS A:   LE gangrene P:   Vanco 7/15 >> 7/22 Zosyn 7/15 >>  ENDOCRINE A:  DM   P:   CBG Q4 with SSI  Hold home regimen   NEUROLOGIC A:   Post-Operative Pain  Ischemic Pain P:   RASS goal: 0 Palliative Care consulted -- appreciate the recs. Wean sedating meds   GLOBAL / family: Dr BLamonte Sakaidiscussed current status and plans with pt's daughters at bedside pre-op 7/23. All understand that the patient has expressed that she wants to be DNR, but that we are accepting intubation and aggressive care peri-operatively to give her a chance to show some recovery to acceptable QOL. They understand that we will assess her for extubation over a few days, that we will consider withdrawal of care if she does not extubate in that time period based on her wishes.    Independent CC time 35 minutes   RBaltazar Apo MD, PhD 02/08/2015, 11:21 AM [facility] [phone] if no answer [phone]

## 2015-02-08 NOTE — Progress Notes (Signed)
Per MD - Wean at CPAP 5/ PS 10.

## 2015-02-09 ENCOUNTER — Inpatient Hospital Stay (HOSPITAL_COMMUNITY): Payer: Medicare Other

## 2015-02-09 ENCOUNTER — Encounter (HOSPITAL_COMMUNITY): Payer: Self-pay | Admitting: Orthopedic Surgery

## 2015-02-09 ENCOUNTER — Ambulatory Visit: Payer: Medicare Other

## 2015-02-09 DIAGNOSIS — J9600 Acute respiratory failure, unspecified whether with hypoxia or hypercapnia: Secondary | ICD-10-CM | POA: Insufficient documentation

## 2015-02-09 LAB — BASIC METABOLIC PANEL
Anion gap: 15 (ref 5–15)
BUN: 62 mg/dL — AB (ref 6–20)
CALCIUM: 7.3 mg/dL — AB (ref 8.9–10.3)
CO2: 13 mmol/L — ABNORMAL LOW (ref 22–32)
CREATININE: 4.42 mg/dL — AB (ref 0.44–1.00)
Chloride: 110 mmol/L (ref 101–111)
GFR calc Af Amer: 10 mL/min — ABNORMAL LOW (ref >60)
GFR calc non Af Amer: 9 mL/min — ABNORMAL LOW (ref >60)
Glucose, Bld: 102 mg/dL — ABNORMAL HIGH (ref 65–99)
Potassium: 4.4 mmol/L (ref 3.5–5.1)
Sodium: 138 mmol/L (ref 135–145)

## 2015-02-09 LAB — CBC
HCT: 21.8 % — ABNORMAL LOW (ref 36.0–46.0)
Hemoglobin: 7.3 g/dL — ABNORMAL LOW (ref 12.0–15.0)
MCH: 26.5 pg (ref 26.0–34.0)
MCHC: 33.5 g/dL (ref 30.0–36.0)
MCV: 79.3 fL (ref 78.0–100.0)
Platelets: 281 10*3/uL (ref 150–400)
RBC: 2.75 MIL/uL — ABNORMAL LOW (ref 3.87–5.11)
RDW: 14.9 % (ref 11.5–15.5)
WBC: 10.2 10*3/uL (ref 4.0–10.5)

## 2015-02-09 LAB — SODIUM, URINE, RANDOM: SODIUM UR: 38 mmol/L

## 2015-02-09 LAB — GLUCOSE, CAPILLARY
GLUCOSE-CAPILLARY: 102 mg/dL — AB (ref 65–99)
GLUCOSE-CAPILLARY: 157 mg/dL — AB (ref 65–99)
Glucose-Capillary: 96 mg/dL (ref 65–99)
Glucose-Capillary: 110 mg/dL — ABNORMAL HIGH (ref 65–99)
Glucose-Capillary: 97 mg/dL (ref 65–99)

## 2015-02-09 LAB — URINALYSIS, ROUTINE W REFLEX MICROSCOPIC
Bilirubin Urine: NEGATIVE
Glucose, UA: NEGATIVE mg/dL
Ketones, ur: 15 mg/dL — AB
NITRITE: NEGATIVE
Protein, ur: 100 mg/dL — AB
SPECIFIC GRAVITY, URINE: 1.012 (ref 1.005–1.030)
Urobilinogen, UA: 0.2 mg/dL (ref 0.0–1.0)
pH: 5 (ref 5.0–8.0)

## 2015-02-09 LAB — URINE MICROSCOPIC-ADD ON

## 2015-02-09 MED ORDER — FUROSEMIDE 10 MG/ML IJ SOLN
80.0000 mg | Freq: Two times a day (BID) | INTRAMUSCULAR | Status: DC
  Administered 2015-02-09 – 2015-02-10 (×3): 80 mg via INTRAVENOUS
  Filled 2015-02-09 (×4): qty 8

## 2015-02-09 MED ORDER — SODIUM BICARBONATE 8.4 % IV SOLN
INTRAVENOUS | Status: DC
  Administered 2015-02-09 – 2015-02-12 (×4): via INTRAVENOUS
  Filled 2015-02-09 (×7): qty 150

## 2015-02-09 NOTE — Progress Notes (Signed)
PULMONARY / CRITICAL CARE MEDICINE   Name: April Kennedy MRN: [id number] DOB: Feb 12, 1934    ADMISSION DATE:  01/29/2015 CONSULTATION DATE:  01/26/2015  REFERRING MD :  Dr. 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  Work up concerning for left > right foot ischemia.  Patient was taken to the OR on 7/19 for L endarterectomy and was unable to extubate post-op.  Returned to ICU for monitoring   STUDIES:  7/15  Venous Duplex >> no DVT in RLE    SIGNIFICANT EVENTS: 7/11  R PleurX placed per Dr. Prescott Gum for metastatic effusion  7/15  PET >> small right pleural effusion (pleurX cath in place, placed on 7/11 per Dr. Prescott Gum, PET scan done showed hypermetabolic pleural thickening of the R hemothorax, metastatic spread to lung, mediastinum and L supraclavicular node. 7/15  MRI Brain >> normal appearance of brain 7/15  Admit with L toe pain, swelling.  Concerns for BLE ischemia  7/19  To OR for L endarterectomy 7/20 Extubation 7/23 Lt AKA  SUBJECTIVE / Interval Events:  Did well overnight. Tolerating PSV. No acute events. Will continue to assess for possible extubation.  VITAL SIGNS: Temp:  [97.7 F (36.5 C)-99 F (37.2 C)] 97.7 F (36.5 C) (07/25 0749) Pulse Rate:  [74-115] 89 (07/25 0730) Resp:  [10-31] 20 (07/25 0730) BP: (93-144)/(35-82) 126/43 mmHg (07/25 0730) SpO2:  [92 %-100 %] 97 % (07/25 0730) FiO2 (%):  [40 %] 40 % (07/25 0730) Weight:  [196 lb 13.9 oz (89.3 kg)] 196 lb 13.9 oz (89.3 kg) (07/25 0500)   INTAKE / OUTPUT:  Intake/Output Summary (Last 24 hours) at 02/09/15 0757 Last data filed at 02/09/15 0700  Gross per 24 hour  Intake 2290.43 ml  Output    472 ml  Net 1818.43 ml    PHYSICAL EXAMINATION: General:  Elderly, ventilated, NAD Neuro: sedated, not following  commands, moves Bilat UE HEENT:  MMM, ETT in place Cardiovascular:  RRR, no murmur Lungs: lungs bilaterally coarse, diminished RLL, Rt sided tube in place Abdomen:  Obese/soft, +BS Musculoskeletal:  No bony deformities. Lt AKA, Rt toe perfusion changes Skin: LLE AKA wound clean,  Rt great toe w/ darkened/necrotic discoloration to skin  LABS:  CBC  Recent Labs Lab 02/06/2015 1105 02/08/15 0255 02/09/15 0220  WBC 14.2* 13.0* 10.2  HGB 8.2* 7.7* 7.3*  HCT 25.5* 23.4* 21.8*  PLT 300 300 281   Coag's  Recent Labs Lab 02/01/2015 1105  INR 1.33     BMET  Recent Labs Lab 01/21/2015 1558 02/08/15 0810 02/09/15 0220  NA 139 139 138  K 4.3 4.7 4.4  CL 111 112* 110  CO2 15* 14* 13*  BUN 47* 55* 62*  CREATININE 3.31* 3.90* 4.42*  GLUCOSE 126* 117* 102*   Electrolytes  Recent Labs Lab 02/04/15 0225  02/11/2015 1558 02/08/15 0810 02/09/15 0220  CALCIUM 7.5*  < > 7.4* 7.6* 7.3*  MG 2.1  --   --  2.5*  --   PHOS 6.6*  --   --  8.0*  --   < > = values in this interval not displayed. Sepsis Markers  Recent Labs Lab 02/06/2015 1714  LATICACIDVEN 1.6   ABG  Recent Labs Lab 02/09/2015 1515 02/13/2015 1200  PHART 7.260* 7.124*  PCO2ART 38.4 45.7*  PO2ART 70.0* 233*     Liver Enzymes  Recent Labs Lab 02/11/2015 1105  AST 63*  ALT 34  ALKPHOS 69  BILITOT 0.5  ALBUMIN 2.0*   Cardiac Enzymes No results for input(s): TROPONINI, PROBNP in the last 168 hours. Glucose  Recent Labs Lab 02/08/15 0756 02/08/15 1155 02/08/15 1604 02/08/15 2022 02/09/15 0012 02/09/15 0431  GLUCAP 123* 108* 103* 98 102* 96    Imaging Dg Chest Port 1 View  02/09/2015   CLINICAL DATA:  Acute respiratory failure. Adenocarcinoma right chest  EXAM: PORTABLE CHEST - 1 VIEW  COMPARISON:  02/08/2015  FINDINGS: Endotracheal tube remains in good position. NG tube in the stomach with the side hole just below the GE junction  Progression of diffuse airspace disease on the right. Loculated right  pleural effusion unchanged. Mild left lower lobe atelectasis or infiltrate slightly improved.  IMPRESSION: Progression of airspace disease throughout the right lung. This may reflect progression of pneumonia. Loculated right pleural effusion unchanged and may represent empyema or malignant effusion.   Electronically Signed   By: Franchot Gallo M.D.   On: 02/09/2015 07:35    ASSESSMENT / PLAN:  PULMONARY OETT 7/19 >> 7/20 OETT 7/23 >>  A: Acute Respiratory Failure due to sepsis, pain / narcs with poor secretion clearance. Probably also component volume overload > extubated.  Reintubated to facilitate L AKA, back to ICU on MV Former Smoker Adenocarcinoma - confirmed in R pleural fluid on thoracentesis, s/p PleurX per Dr. Prescott Gum 7/11 P:   Tolerating PSV >> pending improvement before attempting extubation. Family does not want prolonged intubation if she does not progress Drain pleurex prn, about qod PRN albuterol given smoking history  Mucinex BID Follow up with ONC post discharge Monitor PleurX site SBT , ensure drainage today Neg balance goals, see renal  CARDIOVASCULAR CVL A:  PVD, severe -- ischemic LLE, s/p AKA HTN - controlled HLD P:  Continue lopressor, ASA, lipitor post-op if BP tolerates  RENAL A:   Acute on Chronic Renal Failure - baseline CKD IV (BL Cr ~1.9), worsening Metabolic Acidosis - NONAG predominant , and AG (uremia) Elevated CK ATN  volume status high on examination P:   Lasix Assess UA, urine na, osm kvo  volume overloaded on examination Add bicarb for NON Ag , larger   GASTROINTESTINAL A:   Vent Associated Dysphagia  P:   PPI for SUP  TF's today if unable to extubate  HEMATOLOGIC A:   Mild Anemia , dilution LE Ischemia -- worsening LLE Leukocytosis P:  Trend CBC  Heparin gtt off, no need to restart post-op NO SCD's  Vascular checks  JP drains per VVS lasix  INFECTIOUS A:   LE gangrene P:   Vanco 7/15 >> 7/22 Zosyn 7/15  >>  Add stop date after re assess toe  ENDOCRINE A:  DM   P:   CBG Q4 with SSI  Hold home regimen   NEUROLOGIC A:   Post-Operative Pain  Ischemic Pain P:   RASS goal: 0 Palliative Care consulted -- appreciate the recs. Wean sedating meds  GLOBAL / family: Dr Lamonte Sakai discussed current status and plans with pt's daughters at bedside pre-op 7/23. All understand that the patient has expressed that she wants to be DNR, but that we are accepting intubation and aggressive care peri-operatively to give her a chance to show some recovery to acceptable QOL. They understand that we will assess her for extubation over a few days, that we will consider  withdrawal of care if she does not extubate in that time period based on her wishes.   Elberta Leatherwood, MD,MS,  PGY2 02/09/2015 8:25 AM  STAFF NOTE: Linwood Dibbles, MD FACP have personally reviewed patient's available data, including medical history, events of note, physical examination and test results as part of my evaluation. I have discussed with resident/NP and other care providers such as pharmacist, RN and RRT. In addition, I personally evaluated patient and elicited key findings of: awake, reduced base rt, drain catheter attempt, weaning, well, assess sbt rsbi, assess DNI status upon extubation planned, add bicarb for NONAG , will need abg on wean, family updated by me The patient is critically ill with multiple organ systems failure and requires high complexity decision making for assessment and support, frequent evaluation and titration of therapies, application of advanced monitoring technologies and extensive interpretation of multiple databases.   Critical Care Time devoted to patient care services described in this note is 30 Minutes. This time reflects time of care of this signee: Merrie Roof, MD FACP. This critical care time does not reflect procedure time, or teaching time or supervisory time of PA/NP/Med student/Med Resident  etc but could involve care discussion time. Rest per NP/medical resident whose note is outlined above and that I agree with   Lavon Paganini. Titus Mould, MD, Page Pgr: [facility] 02/09/2015 11:39 AM

## 2015-02-10 LAB — CBC
HCT: 20.3 % — ABNORMAL LOW (ref 36.0–46.0)
HEMOGLOBIN: 6.6 g/dL — AB (ref 12.0–15.0)
MCH: 26.2 pg (ref 26.0–34.0)
MCHC: 32.5 g/dL (ref 30.0–36.0)
MCV: 80.6 fL (ref 78.0–100.0)
Platelets: 296 10*3/uL (ref 150–400)
RBC: 2.52 MIL/uL — AB (ref 3.87–5.11)
RDW: 15.2 % (ref 11.5–15.5)
WBC: 9.9 10*3/uL (ref 4.0–10.5)

## 2015-02-10 LAB — BASIC METABOLIC PANEL
ANION GAP: 14 (ref 5–15)
Anion gap: 12 (ref 5–15)
BUN: 67 mg/dL — ABNORMAL HIGH (ref 6–20)
BUN: 73 mg/dL — AB (ref 6–20)
CO2: 16 mmol/L — ABNORMAL LOW (ref 22–32)
CO2: 17 mmol/L — AB (ref 22–32)
Calcium: 6.8 mg/dL — ABNORMAL LOW (ref 8.9–10.3)
Calcium: 6.8 mg/dL — ABNORMAL LOW (ref 8.9–10.3)
Chloride: 108 mmol/L (ref 101–111)
Chloride: 106 mmol/L (ref 101–111)
Creatinine, Ser: 4.88 mg/dL — ABNORMAL HIGH (ref 0.44–1.00)
Creatinine, Ser: 5.13 mg/dL — ABNORMAL HIGH (ref 0.44–1.00)
GFR calc Af Amer: 8 mL/min — ABNORMAL LOW (ref >60)
GFR calc non Af Amer: 8 mL/min — ABNORMAL LOW (ref >60)
GFR, EST AFRICAN AMERICAN: 9 mL/min — AB (ref >60)
GFR, EST NON AFRICAN AMERICAN: 7 mL/min — AB (ref >60)
GLUCOSE: 136 mg/dL — AB (ref 65–99)
Glucose, Bld: 104 mg/dL — ABNORMAL HIGH (ref 65–99)
POTASSIUM: 4.4 mmol/L (ref 3.5–5.1)
Potassium: 3.7 mmol/L (ref 3.5–5.1)
Sodium: 136 mmol/L (ref 135–145)
Sodium: 137 mmol/L (ref 135–145)

## 2015-02-10 LAB — CLOSTRIDIUM DIFFICILE BY PCR: CDIFFPCR: NEGATIVE

## 2015-02-10 LAB — GLUCOSE, CAPILLARY
GLUCOSE-CAPILLARY: 125 mg/dL — AB (ref 65–99)
Glucose-Capillary: 114 mg/dL — ABNORMAL HIGH (ref 65–99)
Glucose-Capillary: 118 mg/dL — ABNORMAL HIGH (ref 65–99)
Glucose-Capillary: 134 mg/dL — ABNORMAL HIGH (ref 65–99)
Glucose-Capillary: 137 mg/dL — ABNORMAL HIGH (ref 65–99)
Glucose-Capillary: 176 mg/dL — ABNORMAL HIGH (ref 65–99)
Glucose-Capillary: 92 mg/dL (ref 65–99)

## 2015-02-10 LAB — OSMOLALITY, URINE: Osmolality, Ur: 306 mOsm/kg — ABNORMAL LOW (ref 390–1090)

## 2015-02-10 LAB — OSMOLALITY: Osmolality: 312 mOsm/kg — ABNORMAL HIGH (ref 275–300)

## 2015-02-10 MED ORDER — HEPARIN SODIUM (PORCINE) 5000 UNIT/ML IJ SOLN
5000.0000 [IU] | Freq: Three times a day (TID) | INTRAMUSCULAR | Status: DC
  Administered 2015-02-10 – 2015-02-12 (×6): 5000 [IU] via SUBCUTANEOUS
  Filled 2015-02-10 (×6): qty 1

## 2015-02-10 MED ORDER — METOLAZONE 5 MG PO TABS
5.0000 mg | ORAL_TABLET | Freq: Two times a day (BID) | ORAL | Status: DC
  Administered 2015-02-10 – 2015-02-12 (×4): 5 mg via ORAL
  Filled 2015-02-10 (×5): qty 1

## 2015-02-10 MED ORDER — PRO-STAT SUGAR FREE PO LIQD
30.0000 mL | Freq: Two times a day (BID) | ORAL | Status: AC
  Administered 2015-02-10 (×2): 30 mL
  Filled 2015-02-10 (×2): qty 30

## 2015-02-10 MED ORDER — VITAL HIGH PROTEIN PO LIQD
1000.0000 mL | ORAL | Status: DC
  Administered 2015-02-10 – 2015-02-11 (×3): 1000 mL
  Filled 2015-02-10 (×3): qty 1000

## 2015-02-10 MED ORDER — FUROSEMIDE 10 MG/ML IJ SOLN
160.0000 mg | Freq: Four times a day (QID) | INTRAMUSCULAR | Status: DC
  Administered 2015-02-10 – 2015-02-11 (×3): 160 mg via INTRAVENOUS
  Filled 2015-02-10 (×4): qty 16

## 2015-02-10 MED ORDER — VITAL HIGH PROTEIN PO LIQD
1000.0000 mL | ORAL | Status: DC
  Filled 2015-02-10 (×3): qty 1000

## 2015-02-10 NOTE — Progress Notes (Signed)
Nutrition Follow-up  DOCUMENTATION CODES:   Obesity unspecified  INTERVENTION:    Initiate TF via OGT with Vital High Protein at 25 ml/h and Prostat 30 ml BID on day 1; on day 2, d/c Prostat and increase to goal rate of 50 ml/h (1200 ml per day) to provide 1200 kcals, 105 gm protein, 1003 ml free water daily.  NUTRITION DIAGNOSIS:   Inadequate oral intake related to inability to eat as evidenced by NPO status.  Ongoing  GOAL:   Provide needs based on ASPEN/SCCM guidelines  Unmet  MONITOR:   Vent status, Labs, Weight trends, TF tolerance  REASON FOR ASSESSMENT:   Rounds (Start TF per discussion in rounds)    ASSESSMENT:   Patient admitted on 7/15 with leg swelling and pain. Failed conservative treatment for gangrene and required AKA on 7/23.  Discussed patient in ICU rounds and with RN today. Patient remains on ventilator support. Plans to start TF, RD to order appropriate TF formula and rate.   Diet Order:  Diet NPO time specified  Skin:  Wound (see comment) (left leg amputation site)  Last BM:  7/24  Height:   Ht Readings from Last 1 Encounters:  01/27/2015 '5\' 4"'$  (1.626 m)    Weight:   Wt Readings from Last 1 Encounters:  02/09/15 196 lb 13.9 oz (89.3 kg)    Ideal Body Weight:  54.5 kg  Wt Readings from Last 10 Encounters:  02/09/15 196 lb 13.9 oz (89.3 kg)  01/27/15 181 lb 10.5 oz (82.4 kg)    BMI:  Body mass index is 33.78 kg/(m^2).  Estimated Nutritional Needs:   Kcal:  [phone]  Protein:  109 gm  Fluid:  1.5-2 L  EDUCATION NEEDS:   No education needs identified at this time   Molli Barrows, [place], [place], [place] Pager [phone] After Hours Pager [phone]

## 2015-02-10 NOTE — Progress Notes (Signed)
PULMONARY / CRITICAL CARE MEDICINE   Name: April Kennedy MRN: [id number] DOB: 1934-01-24    ADMISSION DATE:  02/05/2015 CONSULTATION DATE:  02/01/2015  REFERRING MD :  Dr. 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  Work up concerning for left > right foot ischemia.  Patient was taken to the OR on 7/19 for L endarterectomy and was unable to extubate post-op.  Returned to ICU for monitoring   STUDIES:  7/15  Venous Duplex >> no DVT in RLE    SIGNIFICANT EVENTS: 7/11  R PleurX placed per Dr. Prescott Gum for metastatic effusion  7/15  PET >> small right pleural effusion (pleurX cath in place, placed on 7/11 per Dr. Prescott Gum, PET scan done showed hypermetabolic pleural thickening of the R hemothorax, metastatic spread to lung, mediastinum and L supraclavicular node. 7/15  MRI Brain >> normal appearance of brain 7/15  Admit with L toe pain, swelling.  Concerns for BLE ischemia  7/19  To OR for L endarterectomy 7/20 Extubation 7/23 Lt AKA  SUBJECTIVE / Interval Events:  Intubated. Sedated. Responding to questions appropriately. Brady event  VITAL SIGNS: Temp:  [97.7 F (36.5 C)-98.4 F (36.9 C)] 98 F (36.7 C) (07/26 0434) Pulse Rate:  [60-106] 64 (07/26 0700) Resp:  [0-29] 24 (07/26 0700) BP: (72-152)/(37-67) 113/49 mmHg (07/26 0700) SpO2:  [90 %-100 %] 97 % (07/26 0700) FiO2 (%):  [40 %] 40 % (07/26 0336)   INTAKE / OUTPUT:  Intake/Output Summary (Last 24 hours) at 02/10/15 0741 Last data filed at 02/10/15 0700  Gross per 24 hour  Intake 3684.31 ml  Output    367 ml  Net 3317.31 ml    PHYSICAL EXAMINATION: General:  Elderly, ventilated, NAD Neuro: sedated, following commands, moves Bilat UE HEENT:  Tacky MM, ETT in place Cardiovascular:  RRR, no murmur Lungs: lungs coarse,  diminished RLL, Rt sided tube in place Abdomen:  Obese/soft, +BS Musculoskeletal:  No bony deformities. Lt AKA, Rt toe perfusion changes Skin: LLE AKA wound clean,  Rt great toe w/ darkened/necrotic discoloration to skin  LABS:  CBC  Recent Labs Lab 02/08/15 0255 02/09/15 0220 02/10/15 0420  WBC 13.0* 10.2 9.9  HGB 7.7* 7.3* 6.6*  HCT 23.4* 21.8* 20.3*  PLT 300 281 296   Coag's  Recent Labs Lab 01/16/2015 1105  INR 1.33     BMET  Recent Labs Lab 02/08/15 0810 02/09/15 0220 02/10/15 0237  NA 139 138 136  K 4.7 4.4 4.4  CL 112* 110 108  CO2 14* 13* 16*  BUN 55* 62* 67*  CREATININE 3.90* 4.42* 4.88*  GLUCOSE 117* 102* 104*   Electrolytes  Recent Labs Lab 02/04/15 0225  02/08/15 0810 02/09/15 0220 02/10/15 0237  CALCIUM 7.5*  < > 7.6* 7.3* 6.8*  MG 2.1  --  2.5*  --   --   PHOS 6.6*  --  8.0*  --   --   < > = values in this interval not displayed. Sepsis Markers  Recent Labs Lab 02/14/2015 1714  LATICACIDVEN 1.6   ABG  Recent Labs Lab 01/18/2015 1515 02/01/2015 1200  PHART 7.260* 7.124*  PCO2ART 38.4 45.7*  PO2ART 70.0* 233*     Liver Enzymes  Recent Labs Lab 02/15/2015 1105  AST 63*  ALT 34  ALKPHOS  69  BILITOT 0.5  ALBUMIN 2.0*   Cardiac Enzymes No results for input(s): TROPONINI, PROBNP in the last 168 hours. Glucose  Recent Labs Lab 02/09/15 0749 02/09/15 1231 02/09/15 1538 02/09/15 2042 02/10/15 0044 02/10/15 0423  GLUCAP 110* 134* 157* 97 92 114*    Imaging No results found.  ASSESSMENT / PLAN:  PULMONARY OETT 7/19 >> 7/20 OETT 7/23 >>  A: Acute Respiratory Failure due to sepsis, pain / narcs with poor secretion clearance. Probably also component volume overload > extubated.  Reintubated to facilitate L AKA, back to ICU on MV Former Smoker Adenocarcinoma - confirmed in R pleural fluid on thoracentesis, s/p PleurX per Dr. Prescott Gum 7/11 P:   Tolerating PSV >> pending improvement before attempting extubation. Family  does not want prolonged intubation if she does not progress Drain pleurex qod, effusion increasing,  PRN albuterol given smoking history  Mucinex BID SBT, PS 5/12, still requires high needs Neg balance goals if able  CARDIOVASCULAR CVL A:  PVD, severe -- ischemic LLE, s/p AKA HTN - controlled HLD P:  Continue lopressor, ASA, lipitor post-op if BP tolerates  RENAL A:   Acute on Chronic Renal Failure - baseline CKD IV (BL Cr ~1.9), worsening Metabolic Acidosis - NONAG predominant , and AG (uremia) Elevated CK ATN  volume status high on examination P:   Lasix , limited response Assess UA, urine na, osm kvo  Continue bicarb for NON Ag Not a candidate HD, would not change outcome  GASTROINTESTINAL A:   Vent Associated Dysphagia  P:   PPI for SUP  TF's to start  HEMATOLOGIC A:   Anemia , worse LE Ischemia -- worsening LLE Leukocytosis - resolved P:  Trend CBC  ?Consider transfusion after family discussion NO SCD's  Vascular checks  Lasix increase No active bleeding, dilution  INFECTIOUS A:   LE gangrene P:   Vanco 7/15 >> 7/22 Zosyn 7/15 >>  consider stop zosyn, treatment course completed  ENDOCRINE A:  DM   P:   CBG Q4 with SSI  Hold home regimen   NEUROLOGIC A:   Post-Operative Pain  Ischemic Pain P:   RASS goal: 0 Palliative Care consulted -- appreciate the recs. Wean sedating meds  GLOBAL / family: Dr Lamonte Sakai discussed current status and plans with pt's daughters at bedside pre-op 7/23. All understand that the patient has expressed that she wants to be DNR, but that we are accepting intubation and aggressive care peri-operatively to give her a chance to show some recovery to acceptable QOL. They understand that we will assess her for extubation over a few days, that we will consider withdrawal of care if she does not extubate in that time period based on her wishes.   Elberta Leatherwood, MD,MS,  PGY2 02/10/2015 7:41 AM   STAFF NOTE: Linwood Dibbles, MD FACP have personally reviewed patient's available data, including medical history, events of note, physical examination and test results as part of my evaluation. I have discussed with resident/NP and other care providers such as pharmacist, RN and RRT. In addition, I personally evaluated patient and elicited key findings of: not progressing well, crt worse with pos 2.6 liters, increase and add metolazone, weaning poorly PS 12, drain pleurax as able with rising effusion, bme tin am, in setting stage 4 cancer, continued support on vent is medically futile , will discuss consideration to provide comfort, MODS noted, allow zosyn to dc , completed course, may need transfusion , have fam meeting first  The patient is critically ill with multiple organ systems failure and requires high complexity decision making for assessment and support, frequent evaluation and titration of therapies, application of advanced monitoring technologies and extensive interpretation of multiple databases.   Critical Care Time devoted to patient care services described in this note is30 Minutes. This time reflects time of care of this signee: Merrie Roof, MD FACP. This critical care time does not reflect procedure time, or teaching time or supervisory time of PA/NP/Med student/Med Resident etc but could involve care discussion time. Rest per NP/medical resident whose note is outlined above and that I agree with   Lavon Paganini. Titus Mould, MD, [place] Pgr: [facility] 02/10/2015 4:23 PM

## 2015-02-10 NOTE — Progress Notes (Signed)
  This NP visited briefly  at bedside with daughter Colletta [place].    Was present for emotional support.  Daughter speaks to "putting it into God's hands".  Family await meetings with Dr Lindi Adie and Dr Titus Mould later today.  Will f/u tomorrow to offer assistance with navigating difficult situation and hard choices.  Wadie Lessen NP   [phone]

## 2015-02-10 NOTE — Progress Notes (Signed)
HEMATOLOGY-ONCOLOGY PROGRESS NOTE  SUBJECTIVE:Patient opens eyes and nods to questions On vent   OBJECTIVE: PHYSICAL EXAMINATION: ECOG PERFORMANCE STATUS: 4 - Bedbound  Filed Vitals:   02/10/15 1712  BP: 132/51  Pulse: 70  Temp:   Resp: 24   Filed Weights   02/01/2015 2312 02/04/15 0449 02/09/15 0500  Weight: 186 lb 12.8 oz (84.732 kg) 192 lb 14.4 oz (87.5 kg) 196 lb 13.9 oz (89.3 kg)    Exam: Lungs: dim at bases Heart: S1 S2 Nl ESM Abd: Soft Ext: AKA Neuro: nods to questions  LABORATORY DATA:  I have reviewed the data as listed CMP Latest Ref Rng 02/10/2015 02/09/2015 02/08/2015  Glucose 65 - 99 mg/dL 104(H) 102(H) 117(H)  BUN 6 - 20 mg/dL 67(H) 62(H) 55(H)  Creatinine 0.44 - 1.00 mg/dL 4.88(H) 4.42(H) 3.90(H)  Sodium 135 - 145 mmol/L 136 138 139  Potassium 3.5 - 5.1 mmol/L 4.4 4.4 4.7  Chloride 101 - 111 mmol/L 108 110 112(H)  CO2 22 - 32 mmol/L 16(L) 13(L) 14(L)  Calcium 8.9 - 10.3 mg/dL 6.8(L) 7.3(L) 7.6(L)  Total Protein 6.5 - 8.1 g/dL - - -  Total Bilirubin 0.3 - 1.2 mg/dL - - -  Alkaline Phos 38 - 126 U/L - - -  AST 15 - 41 U/L - - -  ALT 14 - 54 U/L - - -    Lab Results  Component Value Date   WBC 9.9 02/10/2015   HGB 6.6* 02/10/2015   HCT 20.3* 02/10/2015   MCV 80.6 02/10/2015   PLT 296 02/10/2015   NEUTROABS 11.7* 01/29/2015    ASSESSMENT AND PLAN: 1. Stage 4 adenoca: Discussed with the family that there are no treatment options for her in view of lack of mutations in EGFR or ALK. Her clinical status has deteriorated much more than before and shes not a candidate for chemotherapy. I support palliative care and hospice referral once patients family is in agreement  Based on my discussion with her pre-op, she does not want to be on life support for a longer time period D/W 2 of her family members and I will touch base with [place] tomorrow D/W dr. Titus Mould

## 2015-02-11 ENCOUNTER — Inpatient Hospital Stay (HOSPITAL_COMMUNITY): Payer: Medicare Other

## 2015-02-11 ENCOUNTER — Ambulatory Visit: Payer: Medicare Other | Admitting: Cardiothoracic Surgery

## 2015-02-11 DIAGNOSIS — N170 Acute kidney failure with tubular necrosis: Secondary | ICD-10-CM

## 2015-02-11 DIAGNOSIS — N179 Acute kidney failure, unspecified: Secondary | ICD-10-CM | POA: Insufficient documentation

## 2015-02-11 LAB — CBC
HCT: 21.6 % — ABNORMAL LOW (ref 36.0–46.0)
HEMOGLOBIN: 7.3 g/dL — AB (ref 12.0–15.0)
MCH: 26.4 pg (ref 26.0–34.0)
MCHC: 33.8 g/dL (ref 30.0–36.0)
MCV: 78.3 fL (ref 78.0–100.0)
Platelets: 341 10*3/uL (ref 150–400)
RBC: 2.76 MIL/uL — ABNORMAL LOW (ref 3.87–5.11)
RDW: 15.1 % (ref 11.5–15.5)
WBC: 10.3 10*3/uL (ref 4.0–10.5)

## 2015-02-11 LAB — URINALYSIS, ROUTINE W REFLEX MICROSCOPIC
Bilirubin Urine: NEGATIVE
GLUCOSE, UA: NEGATIVE mg/dL
KETONES UR: NEGATIVE mg/dL
Nitrite: NEGATIVE
PH: 5 (ref 5.0–8.0)
Protein, ur: NEGATIVE mg/dL
Specific Gravity, Urine: 1.008 (ref 1.005–1.030)
Urobilinogen, UA: 0.2 mg/dL (ref 0.0–1.0)

## 2015-02-11 LAB — BASIC METABOLIC PANEL
ANION GAP: 15 (ref 5–15)
BUN: 76 mg/dL — AB (ref 6–20)
CALCIUM: 6.9 mg/dL — AB (ref 8.9–10.3)
CO2: 20 mmol/L — AB (ref 22–32)
Chloride: 103 mmol/L (ref 101–111)
Creatinine, Ser: 5.1 mg/dL — ABNORMAL HIGH (ref 0.44–1.00)
GFR calc non Af Amer: 7 mL/min — ABNORMAL LOW (ref >60)
GFR, EST AFRICAN AMERICAN: 8 mL/min — AB (ref >60)
Glucose, Bld: 189 mg/dL — ABNORMAL HIGH (ref 65–99)
POTASSIUM: 2.8 mmol/L — AB (ref 3.5–5.1)
Sodium: 138 mmol/L (ref 135–145)

## 2015-02-11 LAB — GLUCOSE, CAPILLARY
GLUCOSE-CAPILLARY: 156 mg/dL — AB (ref 65–99)
GLUCOSE-CAPILLARY: 204 mg/dL — AB (ref 65–99)
Glucose-Capillary: 183 mg/dL — ABNORMAL HIGH (ref 65–99)
Glucose-Capillary: 223 mg/dL — ABNORMAL HIGH (ref 65–99)
Glucose-Capillary: 196 mg/dL — ABNORMAL HIGH (ref 65–99)
Glucose-Capillary: 194 mg/dL — ABNORMAL HIGH (ref 65–99)

## 2015-02-11 LAB — URINE MICROSCOPIC-ADD ON

## 2015-02-11 MED ORDER — POTASSIUM CHLORIDE 20 MEQ/15ML (10%) PO SOLN
20.0000 meq | Freq: Two times a day (BID) | ORAL | Status: AC
  Administered 2015-02-11 – 2015-02-12 (×3): 20 meq via ORAL
  Filled 2015-02-11 (×3): qty 15

## 2015-02-11 MED ORDER — FUROSEMIDE 10 MG/ML IJ SOLN
15.0000 mg/h | INTRAVENOUS | Status: DC
  Administered 2015-02-11 – 2015-02-12 (×3): 15 mg/h via INTRAVENOUS
  Filled 2015-02-11 (×5): qty 25

## 2015-02-11 NOTE — Progress Notes (Signed)
Pt. Ordered to have a renal ultrasound. After speaking with Resident MD Marijo File, I was asked to hold off until tomorrow when family makes a decision about comfort care. Ultrasound to be completed tomorrow 2015/02/25 if ordering MD still wants after speaking with family. Will continue to monitor.

## 2015-02-11 NOTE — Progress Notes (Signed)
Pt had several bradycardic episodes this morning between 1000 and 1055. MD notified. No tx at this time as pt is DNR. Will continue to monitor.

## 2015-02-11 NOTE — Progress Notes (Signed)
PULMONARY / CRITICAL CARE MEDICINE   Name: April Kennedy MRN: [id number] DOB: 1934/05/10    ADMISSION DATE:  01/23/2015 CONSULTATION DATE:  02/09/2015  REFERRING MD :  Dr. 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  Work up concerning for left > right foot ischemia.  Patient was taken to the OR on 7/19 for L endarterectomy and was unable to extubate post-op.  Returned to ICU for monitoring   STUDIES:  7/15  Venous Duplex >> no DVT in RLE    SIGNIFICANT EVENTS: 7/11  R PleurX placed per Dr. Prescott Gum for metastatic effusion  7/15  PET >> small right pleural effusion (pleurX cath in place, placed on 7/11 per Dr. Prescott Gum, PET scan done showed hypermetabolic pleural thickening of the R hemothorax, metastatic spread to lung, mediastinum and L supraclavicular node. 7/15  MRI Brain >> normal appearance of brain 7/15  Admit with L toe pain, swelling.  Concerns for BLE ischemia  7/19  To OR for L endarterectomy 7/20 Extubation 7/23 Lt AKA 7/26- family meeting all children recognized she doesn't want this heroic care and prognosis is awful, but requested more time  SUBJECTIVE / Interval Events:  Intubated. Sedated. Is not responding to questions as well as yesterday, however according to nursing staff patient recently received pain meds for bedding change.  Yesterday, family met w/ both CCM and Onc providers for information on patient's current status and prognosis. No significant changes were made to code status or plans of care, as family is still deciding on these matters.  Up 2.8L in 24hrs   VITAL SIGNS: Temp:  [98.4 F (36.9 C)-99.2 F (37.3 C)] 98.4 F (36.9 C) (07/27 0431) Pulse Rate:  [54-105] 69 (07/27 0500) Resp:  [15-99] 24 (07/27 0500) BP: (98-143)/(36-64) 104/45 mmHg (07/27  0500) SpO2:  [93 %-100 %] 100 % (07/27 0500) FiO2 (%):  [40 %] 40 % (07/27 0500) Weight:  [206 lb 2.1 oz (93.5 kg)] 206 lb 2.1 oz (93.5 kg) (07/27 0416)   INTAKE / OUTPUT:  Intake/Output Summary (Last 24 hours) at 02/11/15 0601 Last data filed at 02/11/15 0500  Gross per 24 hour  Intake 4689.33 ml  Output   1070 ml  Net 3619.33 ml    PHYSICAL EXAMINATION: General:  Elderly, ventilated, NAD Neuro: sedated, opens eyes to name, not following commands HEENT:  PERRL, ETT in place Cardiovascular:  RRR, no murmur Lungs: lungs coarse especially at RLL, Rt sided tube in place Abdomen:  Obese/soft, +BS Musculoskeletal:  No bony deformities. Lt AKA, Rt toe perfusion changes Skin: LLE AKA wound clean,  Rt great toe w/ darkened/necrotic discoloration to skin  LABS:  CBC  Recent Labs Lab 02/08/15 0255 02/09/15 0220 02/10/15 0420  WBC 13.0* 10.2 9.9  HGB 7.7* 7.3* 6.6*  HCT 23.4* 21.8* 20.3*  PLT 300 281 296   Coag's  Recent Labs Lab 01/31/2015 1105  INR 1.33     BMET  Recent Labs Lab 02/09/15 0220 02/10/15 0237 02/10/15 2006  NA 138 136 137  K 4.4 4.4 3.7  CL 110 108 106  CO2 13* 16* 17*  BUN 62* 67* 73*  CREATININE 4.42* 4.88* 5.13*  GLUCOSE 102* 104* 136*   Electrolytes  Recent Labs Lab 02/08/15 0810 02/09/15 0220 02/10/15 0237 02/10/15 2006  CALCIUM 7.6*  7.3* 6.8* 6.8*  MG 2.5*  --   --   --   PHOS 8.0*  --   --   --    Sepsis Markers No results for input(s): LATICACIDVEN, PROCALCITON, O2SATVEN in the last 168 hours. ABG  Recent Labs Lab 02/13/2015 1200  PHART 7.124*  PCO2ART 45.7*  PO2ART 233*     Liver Enzymes  Recent Labs Lab 01/27/2015 1105  AST 63*  ALT 34  ALKPHOS 69  BILITOT 0.5  ALBUMIN 2.0*   Cardiac Enzymes No results for input(s): TROPONINI, PROBNP in the last 168 hours. Glucose  Recent Labs Lab 02/10/15 0744 02/10/15 1209 02/10/15 1523 02/10/15 1942 02/11/15 0055 02/11/15 0421  GLUCAP 118* 176* 137* 125* 183* 156*     Imaging No results found.  ASSESSMENT / PLAN:  PULMONARY OETT 7/19 >> 7/20 OETT 7/23 >>  A: Acute Respiratory Failure due to sepsis, pain / narcs with poor secretion clearance. Probably also component volume overload > extubated.  Reintubated to facilitate L AKA, back to ICU on MV Former Smoker Adenocarcinoma - confirmed in R pleural fluid on thoracentesis, s/p PleurX per Dr. Prescott Gum 7/11 P:   Tolerating PSV >> pending improvement before attempting extubation. Family does not want prolonged intubation if she does not progress, at least we thought Drain pleurex qod, improved pcxr PRN albuterol given smoking history  SBT, still requires high needs, start PS 8 Neg balance goals if able  CARDIOVASCULAR CVL A:  PVD, severe -- ischemic LLE, s/p AKA HTN - controlled HLD P:  Continue lopressor, ASA, lipitor post-op if BP tolerates Hydralazine prn  RENAL A:   Acute on Chronic Renal Failure - baseline CKD IV (BL Cr ~1.9), worsening Metabolic Acidosis - NONAG predominant , and AG (uremia) ATN  volume status high on examination and daily positive NOT a candidate for HD and she DOES NOT WANT UTI 7/25? P:   Lasix , limited response Renal US for hydro kvo  Continue bicarb for NON Ag, lower rate Repeat UA, likely needs foley change consider lasix drip  GASTROINTESTINAL A:   Vent Associated Dysphagia  P:   PPI for SUP  TF's to start Stool softeners  HEMATOLOGIC A:   Anemia , worse LE Ischemia -- worsening LLE Leukocytosis - resolved P:  Trend CBC in am  NO SCD's  Vascular checks  Lasix to drip, hope will concetrate No active bleeding, dilution  INFECTIOUS A:   LE gangrene P:   Vanco 7/15 >> 7/22 Zosyn 7/15 >> 7/26 (full course)  Monitor off abx  ENDOCRINE A:  DM   P:   CBG Q4 with SSI  Hold home regimen   NEUROLOGIC A:   Post-Operative Pain  Ischemic Pain P:   RASS goal: 0 Wean sedating meds  GLOBAL / family: 7/26 Both Dr. Titus Mould  (CCM) and Dr. Lindi Adie ([place]) discussed patient's current status and prognosis w/ family. No new decisions were made but family has plans to discuss amongst themselves and make goals of care plans by Thursday.  Elberta Leatherwood, MD,MS,  PGY2 02/11/2015 6:01 AM  STAFF NOTE: Linwood Dibbles, MD FACP have personally reviewed patient's available data, including medical history, events of note, physical examination and test results as part of my evaluation. I have discussed with resident/NP and other care providers such as pharmacist, RN and RRT. In addition, I personally evaluated patient and elicited key findings of: slight more lethargic, coarse BS, edema increased, attempt lasix infusion, maintain zarox, assess renal US,  prognosis is horrible, will call family again to make DNR attempt, she does not want HD and is not a candidate, would be futile to hd, or code, ABX off, course competed, wean PS 8-10,. Accept TV 220 rate 35-40, without other hemodynamic concerns, aggressive measures would be medically ineffective and THE PATIENT STATED SHE DOES NOT WANT The patient is critically ill with multiple organ systems failure and requires high complexity decision making for assessment and support, frequent evaluation and titration of therapies, application of advanced monitoring technologies and extensive interpretation of multiple databases.   Critical Care Time devoted to patient care services described in this note is 30 Minutes. This time reflects time of care of this signee: Merrie Roof, MD FACP. This critical care time does not reflect procedure time, or teaching time or supervisory time of PA/NP/Med student/Med Resident etc but could involve care discussion time. Rest per NP/medical resident whose note is outlined above and that I agree with   Lavon Paganini. Titus Mould, MD, [place] Pgr: [facility] 02/11/2015 7:50 AM

## 2015-02-11 NOTE — Progress Notes (Signed)
Patient ID: April Kennedy, female   DOB: 1934-07-14, 81 y.o.   MRN: [id number] I have had extensive discussions with daughter, med poa. We discussed patients current circumstances and organ failures. We also discussed patient's prior wishes under circumstances such as this. Family has decided to NOT perform resuscitation if arrest but to continue current medical support for now. No hd  Lavon Paganini. Titus Mould, MD, [place] Pgr: [facility]

## 2015-02-11 NOTE — Progress Notes (Signed)
eLink Physician-Brief Progress Note Patient Name: April Kennedy DOB: January 16, 1934 MRN: [id number]   Date of Service  02/11/2015  HPI/Events of Note  Foley catheter with large leukocyte esterase, WBC = 21-50 and few yeast.  eICU Interventions  Will change Foley catheter.      Intervention Category Intermediate Interventions: Infection - evaluation and management  Sommer,Steven Eugene 02/11/2015, 6:18 PM

## 2015-02-12 ENCOUNTER — Ambulatory Visit: Payer: Medicare Other | Admitting: Podiatry

## 2015-02-12 DIAGNOSIS — Z7189 Other specified counseling: Secondary | ICD-10-CM

## 2015-02-12 LAB — GLUCOSE, CAPILLARY
GLUCOSE-CAPILLARY: 177 mg/dL — AB (ref 65–99)
GLUCOSE-CAPILLARY: 183 mg/dL — AB (ref 65–99)
Glucose-Capillary: 165 mg/dL — ABNORMAL HIGH (ref 65–99)
Glucose-Capillary: 157 mg/dL — ABNORMAL HIGH (ref 65–99)
Glucose-Capillary: 155 mg/dL — ABNORMAL HIGH (ref 65–99)

## 2015-02-12 LAB — BASIC METABOLIC PANEL
ANION GAP: 13 (ref 5–15)
BUN: 84 mg/dL — ABNORMAL HIGH (ref 6–20)
CHLORIDE: 102 mmol/L (ref 101–111)
CO2: 23 mmol/L (ref 22–32)
Calcium: 6.4 mg/dL — CL (ref 8.9–10.3)
Creatinine, Ser: 5.2 mg/dL — ABNORMAL HIGH (ref 0.44–1.00)
GFR calc Af Amer: 8 mL/min — ABNORMAL LOW (ref >60)
GFR calc non Af Amer: 7 mL/min — ABNORMAL LOW (ref >60)
GLUCOSE: 190 mg/dL — AB (ref 65–99)
POTASSIUM: 2.9 mmol/L — AB (ref 3.5–5.1)
Sodium: 138 mmol/L (ref 135–145)

## 2015-02-12 LAB — CBC
HCT: 20.2 % — ABNORMAL LOW (ref 36.0–46.0)
Hemoglobin: 6.6 g/dL — CL (ref 12.0–15.0)
MCH: 25.8 pg — ABNORMAL LOW (ref 26.0–34.0)
MCHC: 32.7 g/dL (ref 30.0–36.0)
MCV: 78.9 fL (ref 78.0–100.0)
Platelets: 359 10*3/uL (ref 150–400)
RBC: 2.56 MIL/uL — AB (ref 3.87–5.11)
RDW: 15.1 % (ref 11.5–15.5)
WBC: 11 10*3/uL — ABNORMAL HIGH (ref 4.0–10.5)

## 2015-02-12 MED ORDER — MORPHINE BOLUS VIA INFUSION
5.0000 mg | INTRAVENOUS | Status: DC | PRN
  Administered 2015-02-12: 10 mg via INTRAVENOUS
  Administered 2015-02-12: 24.4 mg via INTRAVENOUS
  Administered 2015-02-12: 15.6 mg via INTRAVENOUS
  Administered 2015-02-12: 19.5 mg via INTRAVENOUS
  Administered 2015-02-12: 12.5 mg via INTRAVENOUS
  Filled 2015-02-12 (×6): qty 20

## 2015-02-12 MED ORDER — MORPHINE SULFATE 25 MG/ML IV SOLN
10.0000 mg/h | INTRAVENOUS | Status: DC
  Administered 2015-02-12: 10 mg/h via INTRAVENOUS
  Filled 2015-02-12 (×2): qty 10

## 2015-02-13 NOTE — Care Management Note (Signed)
Case Management Note  Patient Details  Name: April Kennedy MRN: [id number] Date of Birth: 11-21-1933  Subjective/Objective:                    Action/Plan:   Expected Discharge Date:                  Expected Discharge Plan:  [place]  In-House Referral:     Discharge planning Services     Post Acute Care Choice:    Choice offered to:     DME Arranged:    DME Agency:     HH Arranged:    [place] Agency:     Status of Service:  Completed, signed off  Medicare Important Message Given:    Date Medicare IM Given:    Medicare IM give by:    Date Additional Medicare IM Given:    Additional Medicare Important Message give by:     If discussed at [place] of Stay Meetings, dates discussed:    Additional Comments:  Vergie Living, RN 02/13/2015, 7:27 AM

## 2015-02-16 NOTE — Procedures (Signed)
Extubation Procedure Note  Patient Details:   Name: April Kennedy DOB: [birth year] MRN: [id number]   Airway Documentation:     Evaluation  O2 sats: transiently fell during during procedure Complications: No apparent complications Patient did tolerate procedure well. Bilateral Breath Sounds: Clear, Diminished Suctioning: Airway No   Patient extubated per "withdrawal of life" orders per family request.  Patient tolerated procedure well.    Philomena Doheny 02-24-15, 5:40 PM

## 2015-02-16 NOTE — Progress Notes (Signed)
Patient time of cardiac death 8 as evidenced by asystole on monitor. No heart or lung sounds auscultated, pupils fixed and dilated. Confirmed by [place] Car[place] RN and Alessandra Grout RN. Family at bedside and notified. No belongings at bedside. E-Link MD Sommers notified. CDS notified.

## 2015-02-16 NOTE — Progress Notes (Addendum)
PULMONARY / CRITICAL CARE MEDICINE   Name: PABLO MATHURIN MRN: [id number] DOB: 02-07-1934    ADMISSION DATE:  02/09/2015 CONSULTATION DATE:  02/14/2015  REFERRING MD :  Dr. 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  Work up concerning for left > right foot ischemia.  Patient was taken to the OR on 7/19 for L endarterectomy and was unable to extubate post-op.  Returned to ICU for monitoring   STUDIES:  7/15  Venous Duplex >> no DVT in RLE    SIGNIFICANT EVENTS: 7/11  R PleurX placed per Dr. Prescott Gum for metastatic effusion  7/15  PET >> small right pleural effusion (pleurX cath in place, placed on 7/11 per Dr. Prescott Gum, PET scan done showed hypermetabolic pleural thickening of the R hemothorax, metastatic spread to lung, mediastinum and L supraclavicular node. 7/15  MRI Brain >> normal appearance of brain 7/15  Admit with L toe pain, swelling.  Concerns for BLE ischemia  7/19  To OR for L endarterectomy 7/20 Extubation 7/23 Lt AKA 7/26- family meeting all children recognized she doesn't want this heroic care and prognosis is awful, but requested more time  SUBJECTIVE / Interval Events:  Did well overnight. AM report was that she told PM nurse she would like the tube out. Waiting on family currently. Up ~1L in 24hrs  VITAL SIGNS: Temp:  [97.4 F (36.3 C)-99.2 F (37.3 C)] 98.3 F (36.8 C) (07/28 0808) Pulse Rate:  [48-80] 68 (07/28 0812) Resp:  [14-36] 24 (07/28 0812) BP: (107-134)/(35-55) 125/39 mmHg (07/28 0812) SpO2:  [95 %-100 %] 99 % (07/28 0812) FiO2 (%):  [40 %] 40 % (07/28 0812) Weight:  [209 lb 3.5 oz (94.9 kg)] 209 lb 3.5 oz (94.9 kg) (07/28 0500)   INTAKE / OUTPUT:  Intake/Output Summary (Last 24 hours) at 03/05/2015 0827 Last data filed at 05-Mar-2015 0600  Gross per 24 hour   Intake 2776.5 ml  Output   1800 ml  Net  976.5 ml    PHYSICAL EXAMINATION: General:  Elderly, on vent, NAD Neuro: sedated, opens eyes to name, not following commands (slightly worsen than yesterday) HEENT:  PERRL, ETT in place Cardiovascular:  RRR, no murmur Lungs: lungs coarse especially at RLL, Rt sided tube in place Abdomen:  Obese/soft, +BS Musculoskeletal:  No bony deformities. Lt AKA, Rt toe perfusion changes Skin: LLE AKA wound clean,  Rt great toe w/ darkened/necrotic discoloration to skin  LABS:  CBC  Recent Labs Lab 02/09/15 0220 02/10/15 0420 02/11/15 0720  WBC 10.2 9.9 10.3  HGB 7.3* 6.6* 7.3*  HCT 21.8* 20.3* 21.6*  PLT 281 296 341   Coag's  Recent Labs Lab 01/18/2015 1105  INR 1.33     BMET  Recent Labs Lab 02/10/15 0237 02/10/15 2006 02/11/15 0625  NA 136 137 138  K 4.4 3.7 2.8*  CL 108 106 103  CO2 16* 17* 20*  BUN 67* 73* 76*  CREATININE 4.88* 5.13* 5.10*  GLUCOSE 104* 136* 189*   Electrolytes  Recent Labs Lab 02/08/15 0810  02/10/15 0237 02/10/15 2006 02/11/15 0625  CALCIUM 7.6*  < > 6.8* 6.8* 6.9*  MG 2.5*  --   --   --   --   PHOS 8.0*  --   --   --   --   < > =  values in this interval not displayed. Sepsis Markers No results for input(s): LATICACIDVEN, PROCALCITON, O2SATVEN in the last 168 hours. ABG  Recent Labs Lab 01/16/2015 1200  PHART 7.124*  PCO2ART 45.7*  PO2ART 233*     Liver Enzymes  Recent Labs Lab 01/16/2015 1105  AST 63*  ALT 34  ALKPHOS 69  BILITOT 0.5  ALBUMIN 2.0*   Cardiac Enzymes No results for input(s): TROPONINI, PROBNP in the last 168 hours. Glucose  Recent Labs Lab 02/11/15 1129 02/11/15 1530 02/11/15 1935 02/11/15 2333 02-23-15 0324 02-23-2015 0713  GLUCAP 223* 196* 194* 177* 165* 157*    Imaging No results found.  ASSESSMENT / PLAN:  PULMONARY OETT 7/19 >> 7/20 OETT 7/23 >>  A: Acute Respiratory Failure due to sepsis, pain / narcs with poor secretion clearance. Probably  also component volume overload > extubated.  Reintubated to facilitate L AKA, back to ICU on MV Former Smoker Adenocarcinoma - confirmed in R pleural fluid on thoracentesis, s/p PleurX per Dr. Prescott Gum 7/11 P:   Tolerating PSV >> pending improvement before attempting extubation. Family does not want prolonged intubation if she does not progress -- undecided of actual goals of care at this time Drain pleurex qod, improved pcxr PRN albuterol given smoking history  SBT, still requires high needs Neg balance goals if able, did make some progress Re attempt PS 20-22  CARDIOVASCULAR CVL A:  PVD, severe -- ischemic LLE, s/p AKA HTN - controlled HLD P:  Continue lopressor, ASA, lipitor post-op if BP tolerates Hydralazine prn DNR  RENAL A:   Acute on Chronic Renal Failure - baseline CKD IV (BL Cr ~1.9), worsening Metabolic Acidosis - NONAG predominant , and AG (uremia) ATN  volume status high on examination and daily positive NOT a candidate for HD and she DOES NOT WANT UTI? P:   Change Foley - if decision to extubate today is NOT made. Renal US for hydro kvo  Continue bicarb for NON Ag lasix drip -- better response than boluses (0.87m/kg/hr), will remain 15 mg  GASTROINTESTINAL A:   Vent Associated Dysphagia  Successful BM yeah!!! P:   PPI for SUP  TF's to start Stool softeners  HEMATOLOGIC A:   Anemia , worse LE Ischemia -- S/p Lt AKA Leukocytosis -- resolved P:  CBC/BMP -- pending NO SCD's  Vascular checks  Lasix drip No active bleeding, dilution, some improvement with lasix from 27th  INFECTIOUS A:   LE gangrene P:   Vanco 7/15 >> 7/22 Zosyn 7/15 >> 7/26 (full course)  Monitor off abx  ENDOCRINE A:  DM   P:   CBG Q4 with SSI  Hold home regimen   NEUROLOGIC A:   Post-Operative Pain  Ischemic Pain P:   RASS goal: 0 Wean sedating meds  GLOBAL / family: 7/26 Both Dr. FTitus Mould(CCM) and Dr. GLindi Adie(OStoy discussed patient's current status and  prognosis w/ family. No new decisions were made but family has plans to discuss amongst themselves and make goals of care plans by Thursday.  IElberta Leatherwood MD,MS,  PGY2 [id number]-[id number] AM  STAFF NOTE: ILinwood Dibbles MD FACP have personally reviewed patient's available data, including medical history, events of note, physical examination and test results as part of my evaluation. I have discussed with resident/NP and other care providers such as pharmacist, RN and RRT. In addition, I personally evaluated patient and elicited key findings of: udpated family, for meeting today at 330 pm, less awake today, some response to lasix  infusion, keep at 15 mg, bicarb to remain for NONAG, wean attempt to PS 22, continued support is medically ineffective, will clarify again with family, her wishes were clear, may need ethics support, drain pleurex The patient is critically ill with multiple organ systems failure and requires high complexity decision making for assessment and support, frequent evaluation and titration of therapies, application of advanced monitoring technologies and extensive interpretation of multiple databases.   Critical Care Time devoted to patient care services described in this note is 30 Minutes. This time reflects time of care of this signee: Merrie Roof, MD FACP. This critical care time does not reflect procedure time, or teaching time or supervisory time of PA/NP/Med student/Med Resident etc but could involve care discussion time. Rest per NP/medical resident whose note is outlined above and that I agree with   Lavon Paganini. Titus Mould, MD, [place] Pgr: [facility] 2015/02/20 11:05 AM    Family decided on comfort care.  Lavon Paganini. Titus Mould, MD, [place] Pgr: [facility]

## 2015-02-16 NOTE — Progress Notes (Signed)
Pt continued to remain uncomfortable post extubation. Dose of morphine increased with nurse-perceived level of discomfort (see eMAR).

## 2015-02-16 NOTE — Progress Notes (Addendum)
Prior to extubation- pt given a bolus of morphine along with a rate increase of 25% (per order). Following extubation, the patient nodded that she was in pain. Immediately given additional dose of morphine along with another rate increase of 25% (per order). [place] notified. Family at bedside.

## 2015-02-16 NOTE — Progress Notes (Signed)
CRITICAL VALUE ALERT  Critical value received:  Hgb 6.6, Calcium 6.4  Date of notification:  02/11/15  Time of notification:  4270  Critical value read back: yes  Nurse who received alert:  Donata Duff RN  MD notified (1st page):  MD notified on floor- Resident Georges Lynch   Time of first page:  9  MD notified (2nd page):  Time of second page:  Responding MD:  Georges Lynch  Time MD responded:  1150

## 2015-02-16 NOTE — Progress Notes (Signed)
Wasted 150 mL of fentanyl in sink with Polly Cobia, RN.

## 2015-02-16 NOTE — Progress Notes (Signed)
Kennyth Lose, [place] consulted due to patient's post-op status. She declined the patient as a ME case because her surgeries were due to expected outcomes of present illness' at time of admission.

## 2015-02-16 NOTE — Progress Notes (Signed)
~  30 mL Morphine gtt wasted with [place], RN

## 2015-02-16 NOTE — Progress Notes (Signed)
Chaplain responded to consult for end of life.  Chaplain introduced self to family at bedside, pt's nephew and another family member present.  Chaplain offered emotional and spiritual support and will follow up as needed.    03-01-15 1100  Clinical Encounter Type  Visited With Patient and family together  Visit Type Initial;Social support;Critical Care  Referral From Physician  Spiritual Encounters  Spiritual Needs Emotional  Stress Factors  Patient Stress Factors Exhausted;Health changes  Family Stress Factors None identified   Geralyn Flash 01-Mar-2015 11:31 AM

## 2015-02-16 DEATH — deceased

## 2015-03-18 ENCOUNTER — Ambulatory Visit: Payer: Medicare Other | Admitting: Cardiothoracic Surgery

## 2015-03-19 NOTE — Discharge Summary (Signed)
April Kennedy, April Kennedy NO.:  [id number]  MEDICAL RECORD NO.:  [id number]  LOCATION:  2M03C                        FACILITY:  [facility]  PHYSICIAN:  Raylene Miyamoto, MD DATE OF BIRTH:  1933-08-15  DATE OF ADMISSION:  01/29/2015 DATE OF DISCHARGE:  03-10-15                              DISCHARGE SUMMARY   DEATH SUMMARY  This is a 81 year old female, past medical history of hypertension, CKD, diabetes, and recent diagnosis of stage IV metastatic adenocarcinoma, suspected primary lung, admitted on 07/15 with toe pain and swelling, workup concerning for left greater than right foot ischemia.  The patient was taken to the operating room on 07/19 for left endarterectomy and was unable to extubate postop, returned to the ICU.  Her studies done on hospitalization include a venous Doppler showing no DVT in the right lower extremity.  Significant events showed a Pleurx catheter placed by CVTS for her metastatic cancer.  On January 26, 2015, PET scan showed small right pleural effusion.  On January 30, 2015, Pleurx catheter placement __________ showed hypermetabolic pleural thickening of the right hemothorax, metastatic __________ lung, mediastinum and left supraclavicular node.  __________, the patient had MRI of the brain, normal appearance of the brain.  On 07/15, the patient was admitted with left toe pain and swelling, concerns of bilateral ischemia, taken to the operating room as noted above on the 19th for left endarterectomy.  She was extubated on the 20th.  She unfortunately went back to the operating room for left AKA.  At the time of seeing this patient, she stated crystal clear that she did not want to have prolonged support postoperatively and did not want heroic care under any circumstances based on poor functional status.  Essentially, the patient's family eventually came around to understanding her wishes __________ family meeting __________ recognized she did  not want heroic medical care, and the patient was making no progress with worsening on the ventilator machine with worsening and continued hypoxic respiratory failure, was not weaned properly, was in shock.  The patient underwent comfort care and expired per her wishes.  FINAL DIAGNOSES UPON DEATH: 1. Stage IV metastatic lung cancer with Pleurx catheter placed. 2. Peripheral vascular disease. 3. Status post above-knee amputation. 4. Acute respiratory failure. 5. Acute renal failure. 6. __________ lower extremity.     Raylene Miyamoto, MD     DJF/MEDQ  D:  03/03/2015  T:  03/04/2015  Job:  [id number]

## 2016-04-27 IMAGING — CR DG CHEST 1V PORT
1 series · 1 of 1 positions shown · non-contrast
Comparison: 02/05/2015.  PET-CT 01/30/2015.

CLINICAL DATA: Hypoxia.

EXAM:
PORTABLE CHEST - 1 VIEW

[AP]
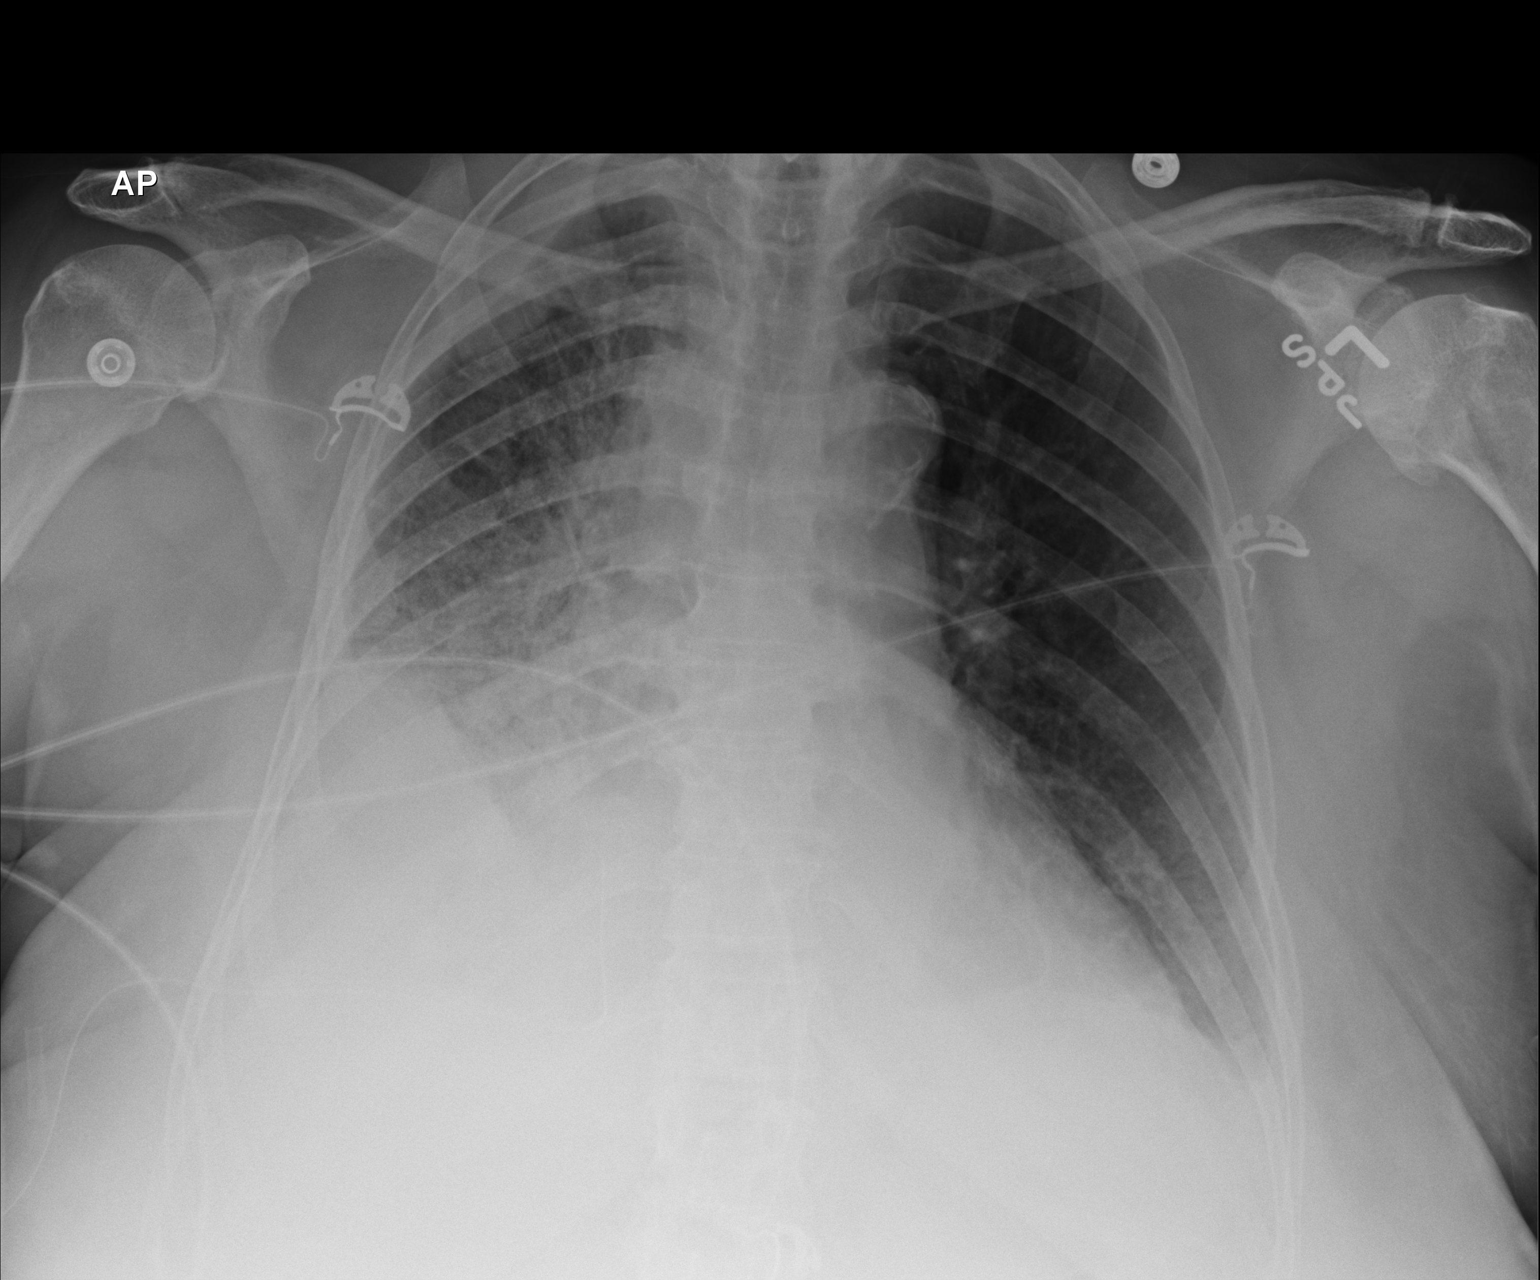

[1 of 1 positions shown; findings below may reference images not displayed]

FINDINGS: Mediastinum and hilar structures are unremarkable. Diffuse right
lung infiltrate with atelectasis noted. Left lung is clear.
Persistent right pleural thickening and/or effusion again noted .
Cardiomegaly normal pulmonary vascularity.
IMPRESSION: Persistent right lung infiltrate and atelectasis with persistent
right pleural thickening and/or effusion is again noted. Pleural
thickening again consistent with pleural tumor as noted on prior
PET-CT of 01/30/2015 . No interim change.

## 2016-04-28 IMAGING — CR DG CHEST 1V PORT
1 series · 1 of 1 positions shown · non-contrast
Comparison: Single view of the chest 02/06/2015 and 02/05/2015.

CLINICAL DATA: Metastatic adenocarcinoma with likely lung primary.
Acute respiratory failure.

EXAM:
PORTABLE CHEST - 1 VIEW

[AP]
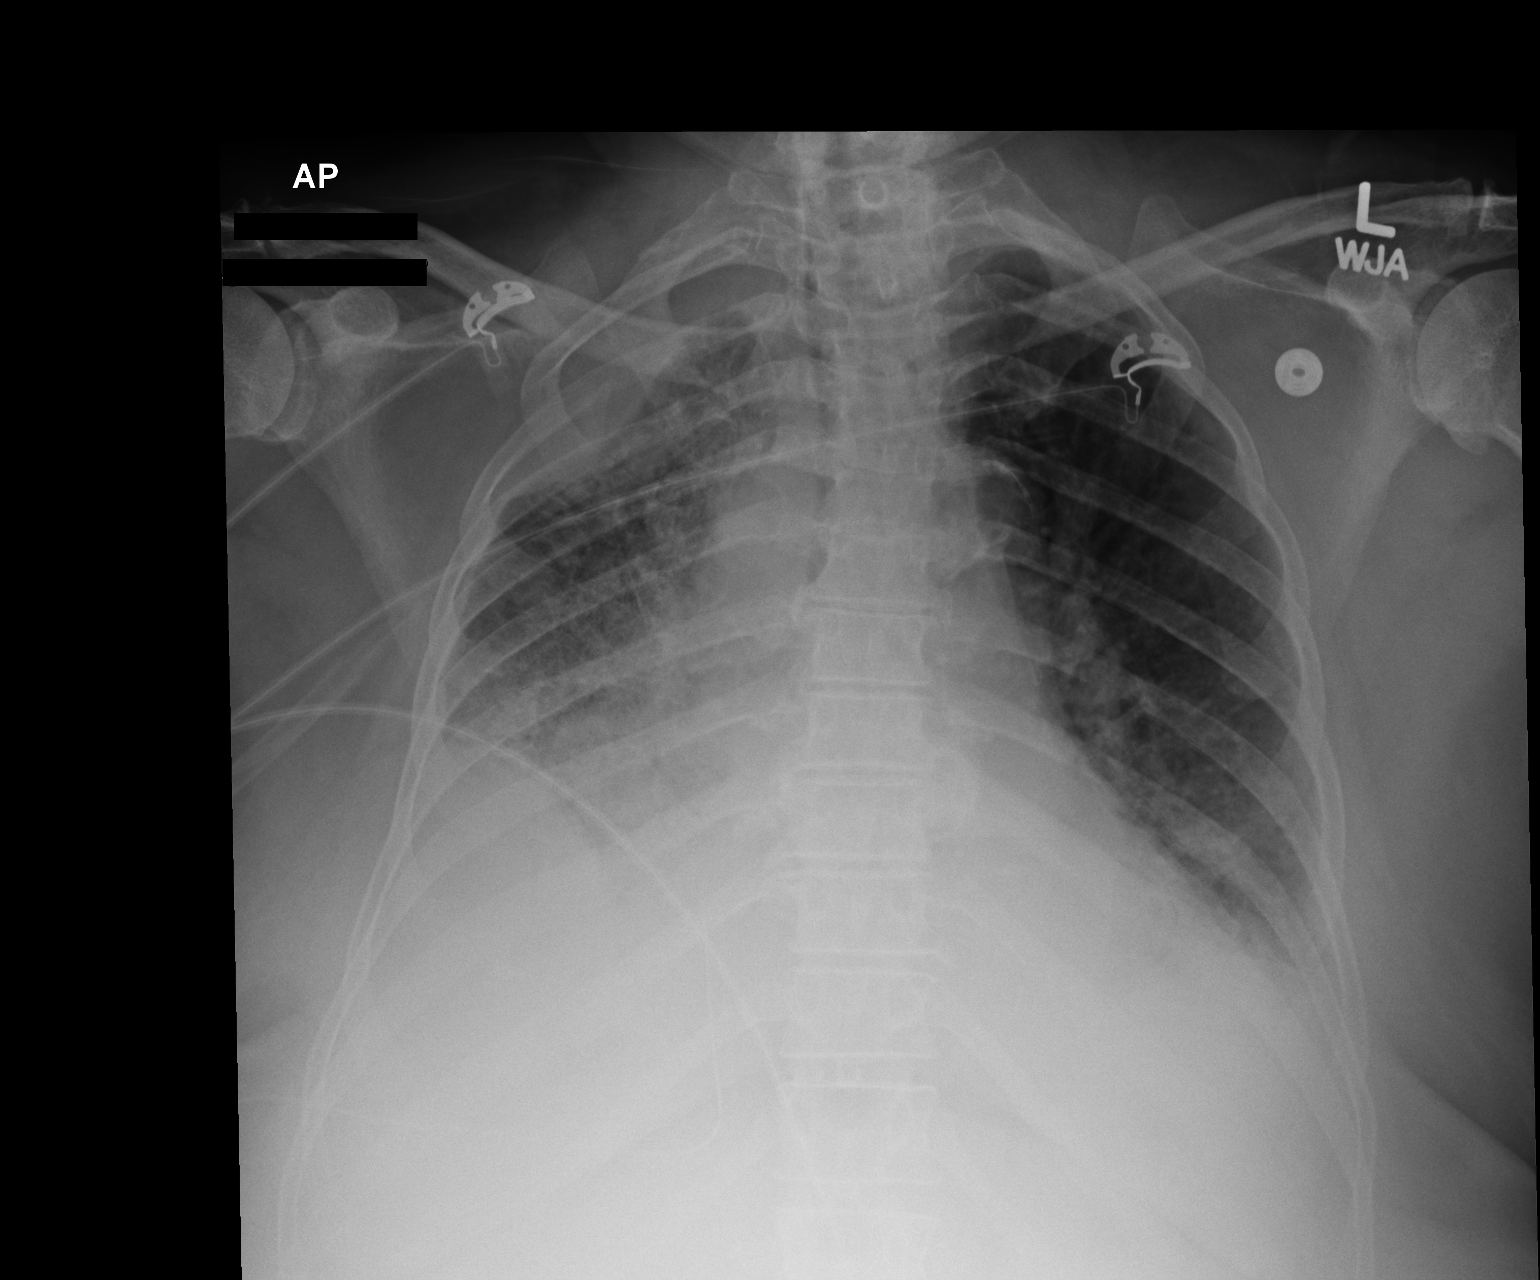

[1 of 1 positions shown; findings below may reference images not displayed]

FINDINGS: Right much worse than left pleural effusions airspace disease
persist without change. Heart size is upper normal. No pneumothorax
pleural effusion.
IMPRESSION: No change in right much worse than left airspace disease and pleural
effusions.

## 2016-04-28 IMAGING — CR DG ABD PORTABLE 1V
1 series · 1 of 1 positions shown · non-contrast
Comparison: 02/03/2015.

CLINICAL DATA: Orogastric tube placement at bedside. Current
history of metastatic lung cancer

EXAM:
PORTABLE ABDOMEN - 1 VIEW

[AP]
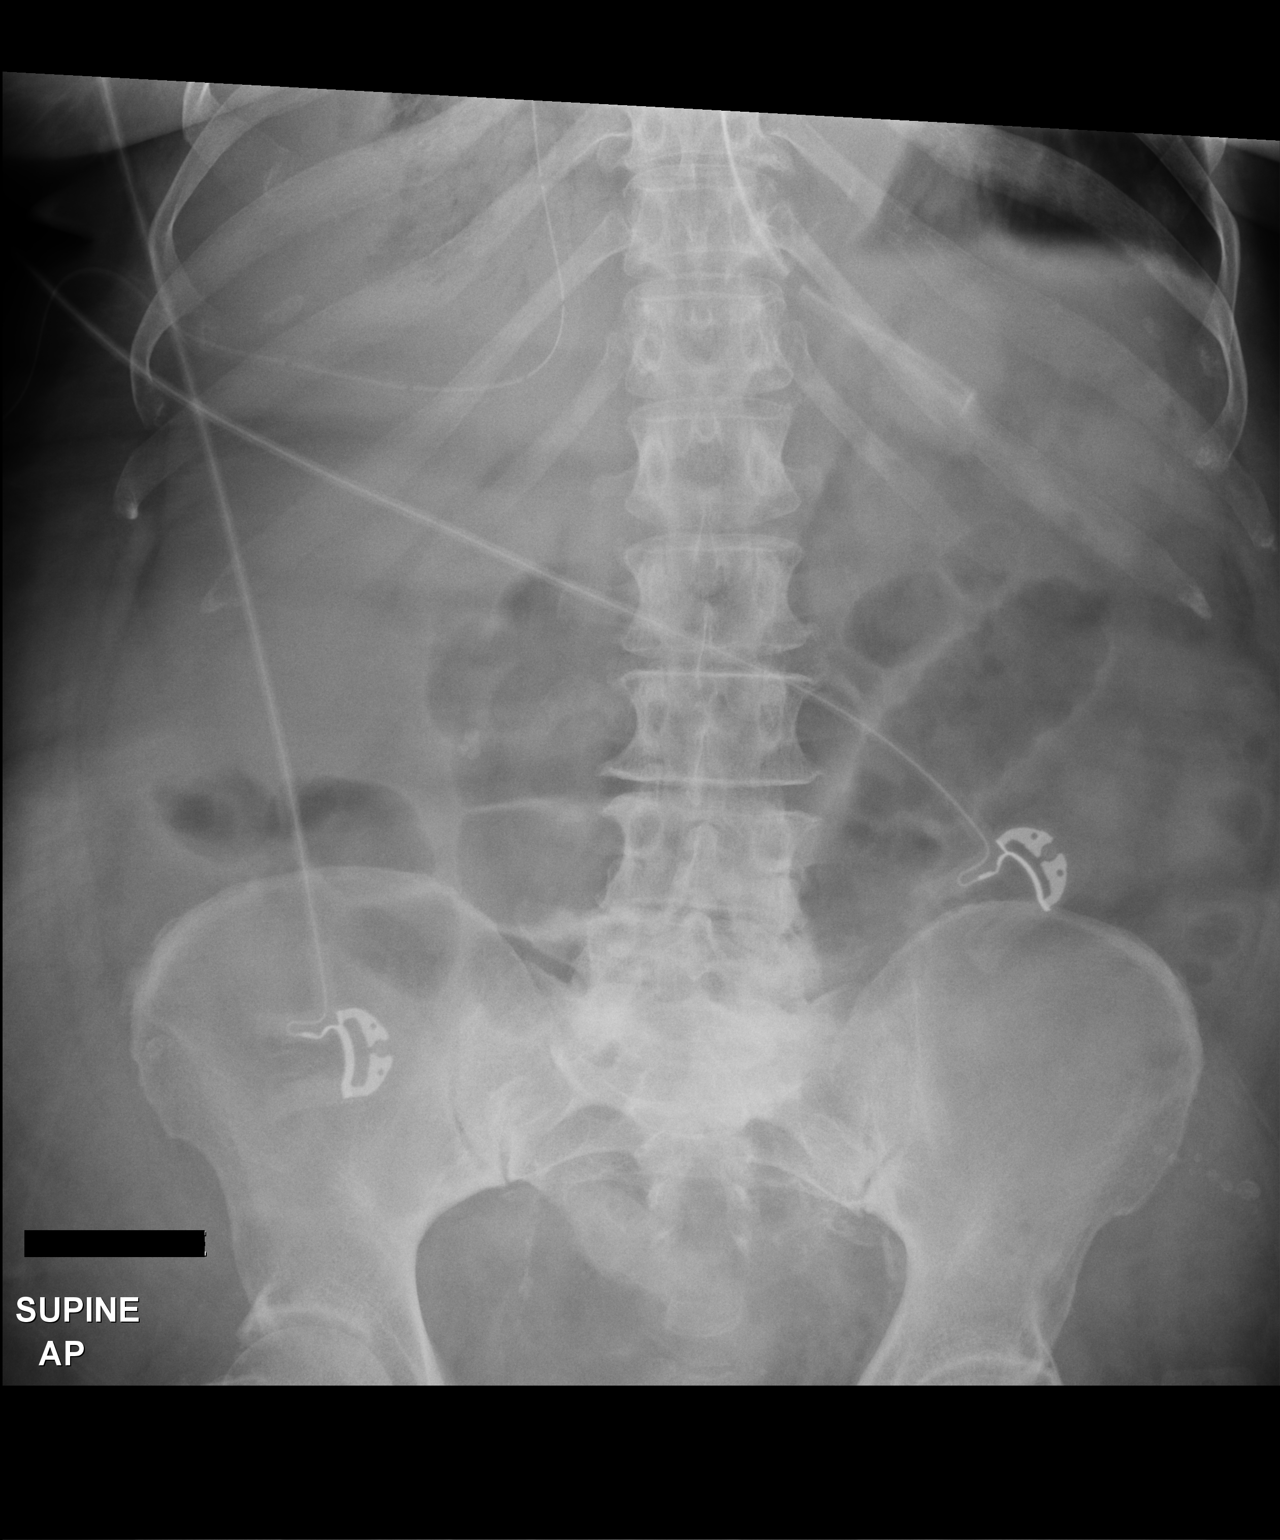

[1 of 1 positions shown; findings below may reference images not displayed]

FINDINGS: Orogastric tube tip projects over the expected position of the
gastric fundus. Mild gaseous distention of jejunal loops in the left
upper quadrant, improved since the examination 4 days ago. Bowel gas
pattern otherwise unremarkable. Loculated right pleural effusion
with indwelling PleurX drainage catheter and associated passive
atelectasis in the right lower lobe, unchanged.
IMPRESSION: 1. OG tube tip in the fundus of the stomach.
2. Localized ileus involving the jejunum in the left upper quadrant,
improved since 4 days ago.
3. Stable loculated right pleural effusion with indwelling PleurX
drainage catheter. Associated passive atelectasis in the right lower
lobe, unchanged.

## 2016-04-30 IMAGING — CR DG CHEST 1V PORT
1 series · 1 of 1 positions shown · non-contrast
Comparison: 02/08/2015

CLINICAL DATA: Acute respiratory failure. Adenocarcinoma right
chest

EXAM:
PORTABLE CHEST - 1 VIEW

[AP]
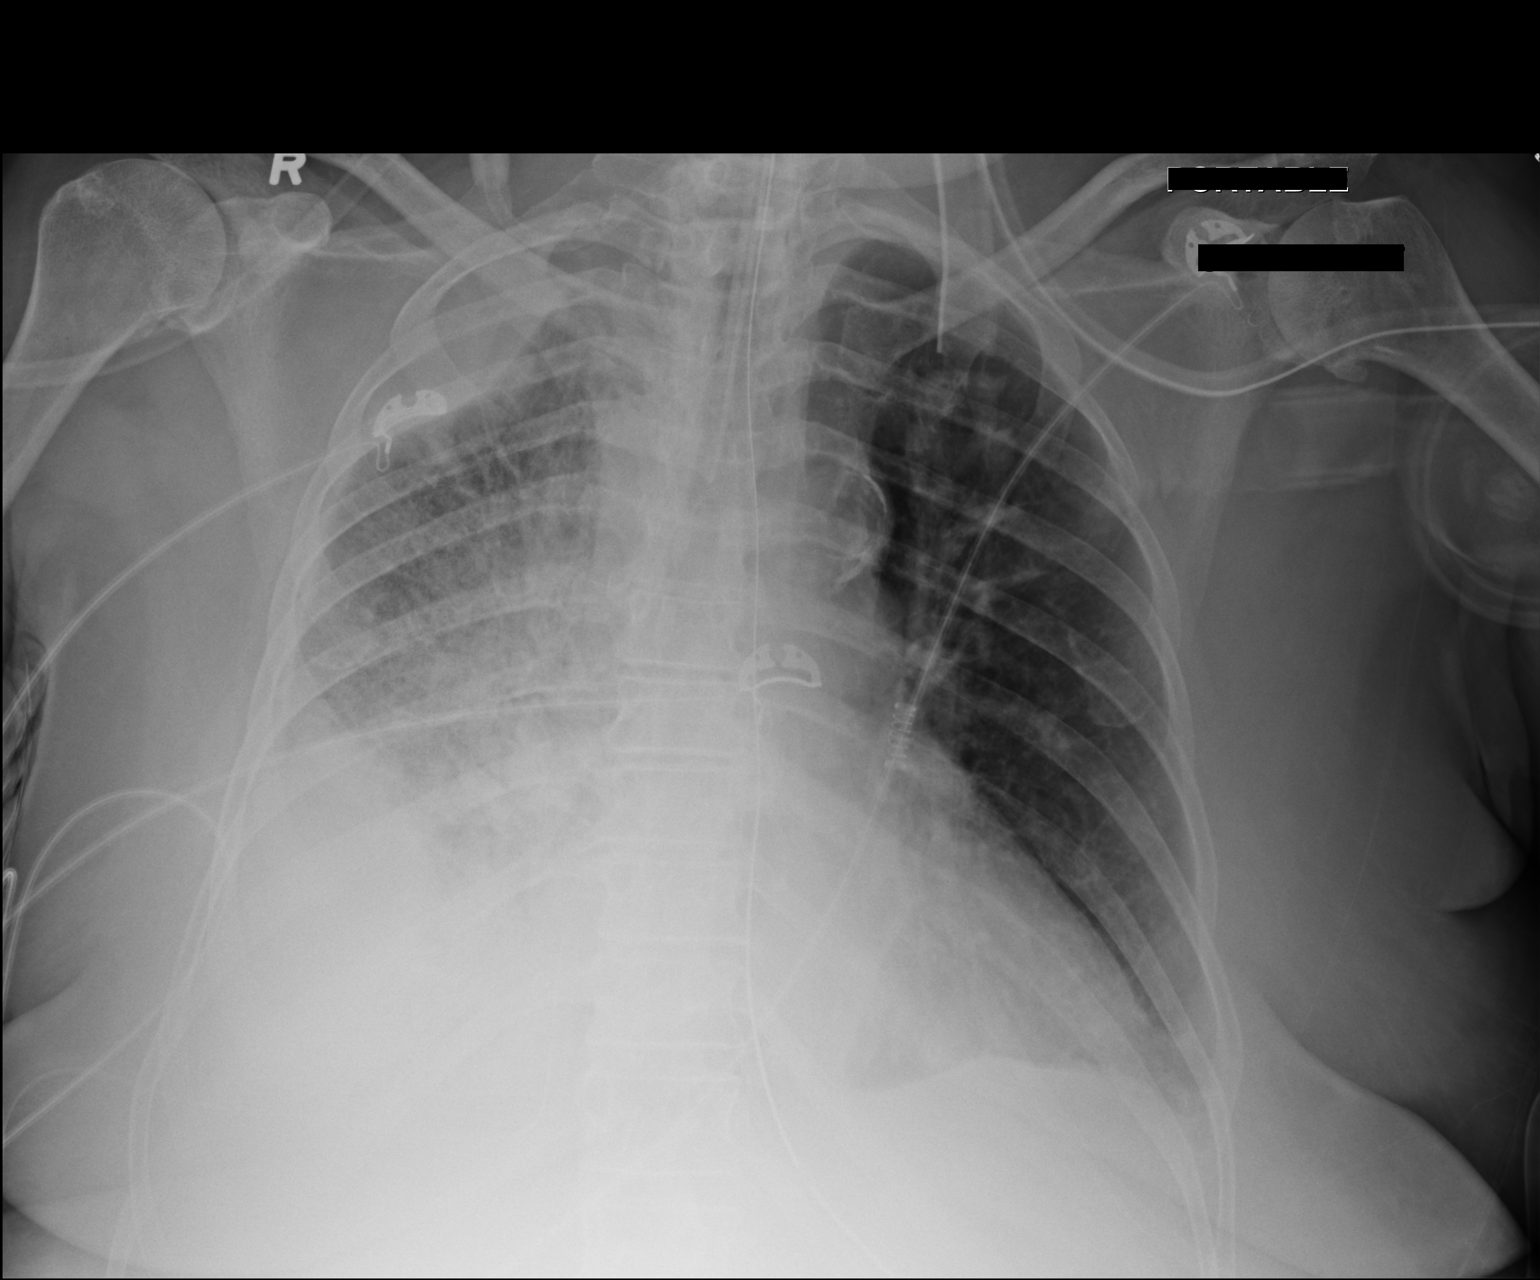

[1 of 1 positions shown; findings below may reference images not displayed]

FINDINGS: Endotracheal tube remains in good position. NG tube in the stomach
with the side hole just below the GE junction

Progression of diffuse airspace disease on the right. Loculated
right pleural effusion unchanged. Mild left lower lobe atelectasis
or infiltrate slightly improved.
IMPRESSION: Progression of airspace disease throughout the right lung. This may
reflect progression of pneumonia. Loculated right pleural effusion
unchanged and may represent empyema or malignant effusion.

## 2016-05-02 IMAGING — CR DG CHEST 1V PORT
1 series · 1 of 1 positions shown · non-contrast
Comparison: Prior chest x-ray 02/09/2015

CLINICAL DATA: 80-year-old female with right-sided pleural effusion

EXAM:
PORTABLE CHEST - 1 VIEW

[AP]
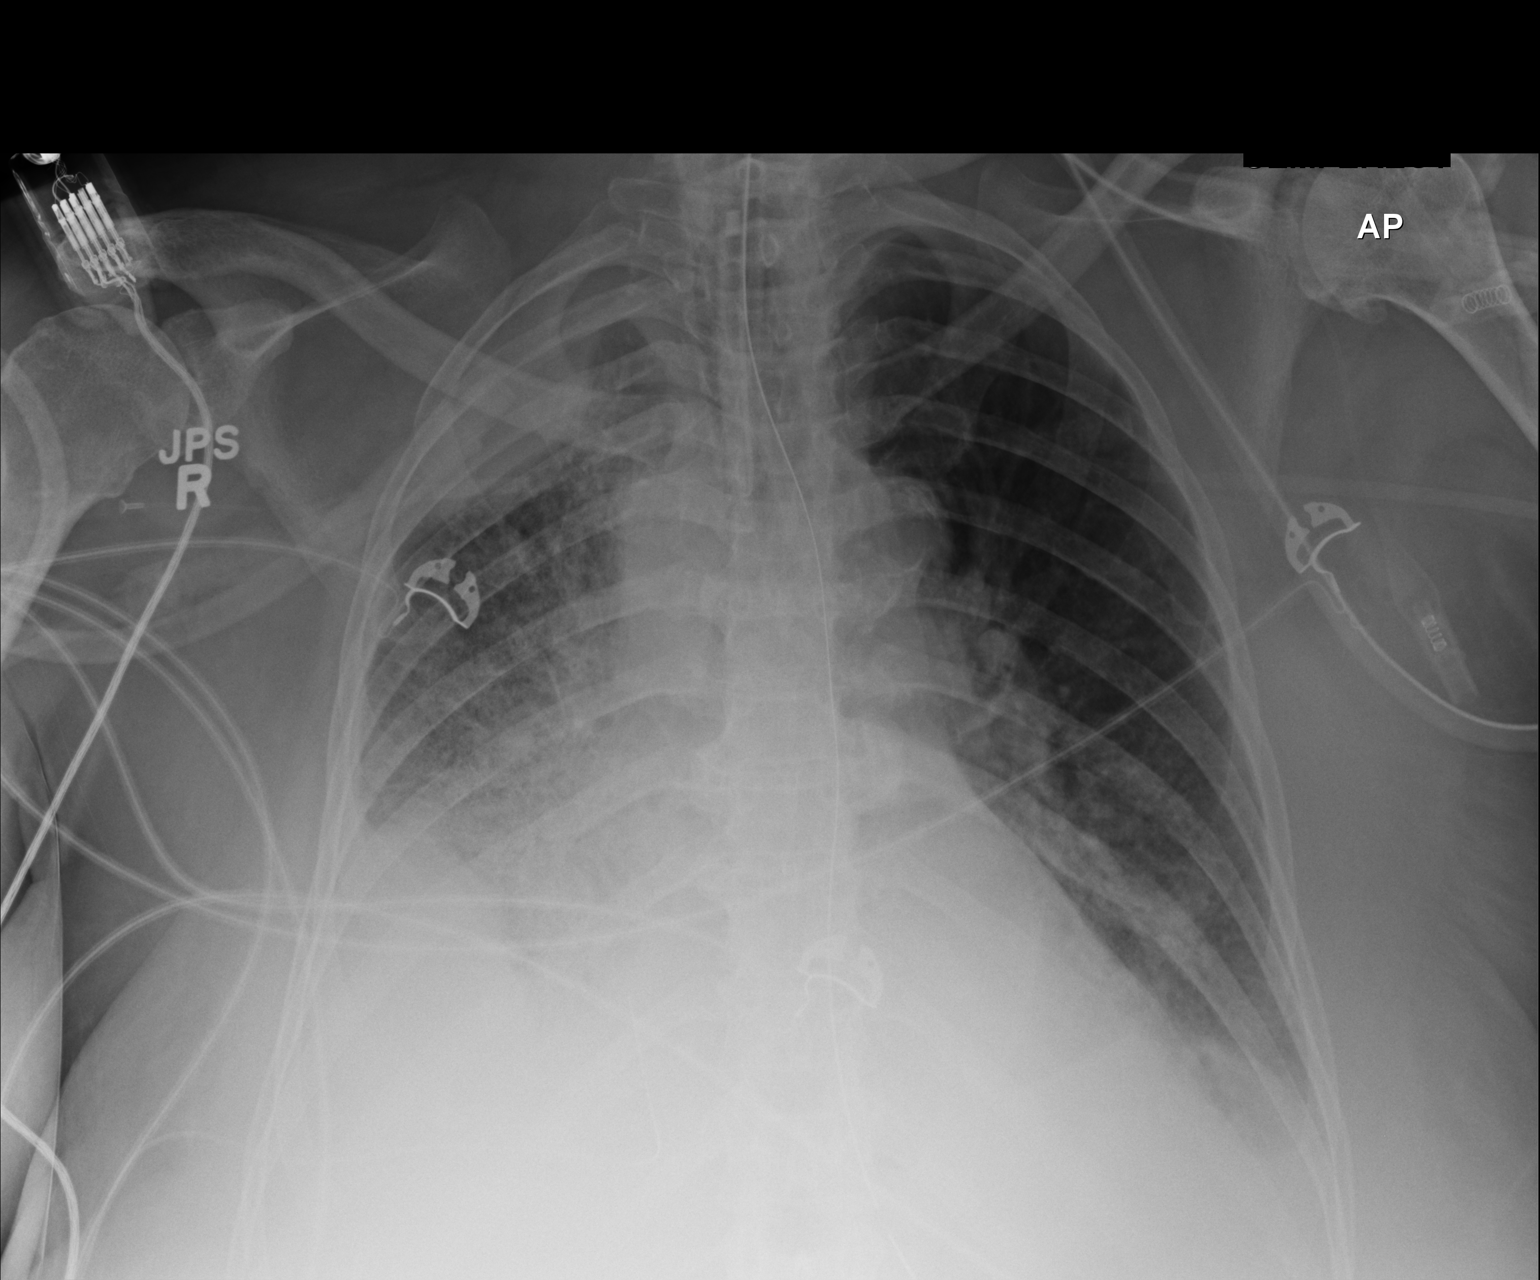

[1 of 1 positions shown; findings below may reference images not displayed]

FINDINGS: The patient remains intubated. The tip of the endotracheal tube is
3.7 cm above the carina. A nasogastric tube remains present. The tip
lies below the diaphragm, presumably within the stomach. Stable
cardiac and mediastinal contours. Atherosclerotic calcifications
again noted in the transverse aorta. Stable moderately large
right-sided pleural effusion with loculation superolateral E and
associated right basilar atelectasis. Slight interval increase in a
small layering left pleural effusion and associated atelectasis. No
evidence of pulmonary edema. Tunneled right pleural drain.
IMPRESSION: 1. Stable and satisfactory support apparatus.
2. Stable loculated right pleural effusion/soft tissue pleural
thickening.
3. Slight interval increase in small layering left effusion and
associated left basilar atelectasis.
4. Otherwise, negative for pulmonary edema or new airspace
infiltrate.
